# Patient Record
Sex: Female | Born: 1965
Health system: Southern US, Community
[De-identification: ages and names within clinical notes are randomized; demographics above are authoritative.]

## PROBLEM LIST (undated history)

## (undated) DIAGNOSIS — Z87898 Personal history of other specified conditions: Secondary | ICD-10-CM

## (undated) DIAGNOSIS — F419 Anxiety disorder, unspecified: Secondary | ICD-10-CM

## (undated) DIAGNOSIS — N920 Excessive and frequent menstruation with regular cycle: Secondary | ICD-10-CM

## (undated) DIAGNOSIS — E785 Hyperlipidemia, unspecified: Secondary | ICD-10-CM

## (undated) DIAGNOSIS — D509 Iron deficiency anemia, unspecified: Secondary | ICD-10-CM

## (undated) DIAGNOSIS — E113299 Type 2 diabetes mellitus with mild nonproliferative diabetic retinopathy without macular edema, unspecified eye: Secondary | ICD-10-CM

## (undated) DIAGNOSIS — R740 Nonspecific elevation of levels of transaminase and lactic acid dehydrogenase [LDH]: Secondary | ICD-10-CM

## (undated) DIAGNOSIS — E1110 Type 2 diabetes mellitus with ketoacidosis without coma: Secondary | ICD-10-CM

## (undated) DIAGNOSIS — R748 Abnormal levels of other serum enzymes: Secondary | ICD-10-CM

## (undated) DIAGNOSIS — Z22322 Carrier or suspected carrier of Methicillin resistant Staphylococcus aureus: Secondary | ICD-10-CM

## (undated) DIAGNOSIS — F32A Depression, unspecified: Secondary | ICD-10-CM

## (undated) DIAGNOSIS — F329 Major depressive disorder, single episode, unspecified: Secondary | ICD-10-CM

## (undated) DIAGNOSIS — E1010 Type 1 diabetes mellitus with ketoacidosis without coma: Secondary | ICD-10-CM

## (undated) HISTORY — DX: Depression, unspecified: F32.A

## (undated) HISTORY — DX: Excessive and frequent menstruation with regular cycle: N92.0

## (undated) HISTORY — DX: Personal history of other specified conditions: Z87.898

## (undated) HISTORY — DX: Iron deficiency anemia, unspecified: D50.9

## (undated) HISTORY — DX: Major depressive disorder, single episode, unspecified: F32.9

## (undated) HISTORY — DX: Type 2 diabetes mellitus with mild nonproliferative diabetic retinopathy without macular edema, unspecified eye: E11.3299

## (undated) HISTORY — PX: TUBAL LIGATION: SHX77

## (undated) HISTORY — DX: Anxiety disorder, unspecified: F41.9

## (undated) HISTORY — DX: Abnormal levels of other serum enzymes: R74.8

## (undated) HISTORY — DX: Nonspecific elevation of levels of transaminase and lactic acid dehydrogenase (ldh): R74.0

## (undated) HISTORY — DX: Type 1 diabetes mellitus with ketoacidosis without coma: E10.10

## (undated) HISTORY — DX: Hyperlipidemia, unspecified: E78.5

---

## 1997-08-16 ENCOUNTER — Encounter (HOSPITAL_COMMUNITY): Admission: RE | Admit: 1997-08-16 | Discharge: 1997-09-22 | Payer: Self-pay | Admitting: Obstetrics & Gynecology

## 1999-03-01 ENCOUNTER — Other Ambulatory Visit: Admission: RE | Admit: 1999-03-01 | Discharge: 1999-03-01 | Payer: Self-pay | Admitting: Family Medicine

## 1999-08-23 ENCOUNTER — Other Ambulatory Visit: Admission: RE | Admit: 1999-08-23 | Discharge: 1999-08-23 | Payer: Self-pay | Admitting: *Deleted

## 1999-10-17 ENCOUNTER — Ambulatory Visit (HOSPITAL_COMMUNITY): Admission: RE | Admit: 1999-10-17 | Discharge: 1999-10-17 | Payer: Self-pay | Admitting: *Deleted

## 1999-10-17 ENCOUNTER — Encounter: Payer: Self-pay | Admitting: *Deleted

## 1999-10-31 ENCOUNTER — Ambulatory Visit (HOSPITAL_COMMUNITY): Admission: RE | Admit: 1999-10-31 | Discharge: 1999-10-31 | Payer: Self-pay | Admitting: *Deleted

## 1999-10-31 ENCOUNTER — Encounter: Payer: Self-pay | Admitting: *Deleted

## 1999-12-19 ENCOUNTER — Encounter: Admission: RE | Admit: 1999-12-19 | Discharge: 2000-02-20 | Payer: Self-pay | Admitting: Obstetrics and Gynecology

## 1999-12-28 ENCOUNTER — Ambulatory Visit (HOSPITAL_COMMUNITY): Admission: RE | Admit: 1999-12-28 | Discharge: 1999-12-28 | Payer: Self-pay | Admitting: Obstetrics and Gynecology

## 1999-12-28 ENCOUNTER — Encounter: Payer: Self-pay | Admitting: Obstetrics and Gynecology

## 2000-01-29 ENCOUNTER — Ambulatory Visit (HOSPITAL_COMMUNITY): Admission: RE | Admit: 2000-01-29 | Discharge: 2000-01-29 | Payer: Self-pay | Admitting: Obstetrics and Gynecology

## 2000-01-29 ENCOUNTER — Encounter: Payer: Self-pay | Admitting: Obstetrics and Gynecology

## 2000-02-09 ENCOUNTER — Encounter: Admission: RE | Admit: 2000-02-09 | Discharge: 2000-03-07 | Payer: Self-pay | Admitting: Obstetrics and Gynecology

## 2000-02-19 ENCOUNTER — Encounter: Payer: Self-pay | Admitting: Emergency Medicine

## 2000-02-19 ENCOUNTER — Observation Stay (HOSPITAL_COMMUNITY): Admission: AD | Admit: 2000-02-19 | Discharge: 2000-02-20 | Payer: Self-pay | Admitting: Obstetrics and Gynecology

## 2000-02-21 ENCOUNTER — Encounter: Payer: Self-pay | Admitting: Obstetrics and Gynecology

## 2000-03-06 ENCOUNTER — Encounter (INDEPENDENT_AMBULATORY_CARE_PROVIDER_SITE_OTHER): Payer: Self-pay

## 2000-03-06 ENCOUNTER — Inpatient Hospital Stay (HOSPITAL_COMMUNITY): Admission: AD | Admit: 2000-03-06 | Discharge: 2000-03-09 | Payer: Self-pay | Admitting: Obstetrics and Gynecology

## 2000-03-10 ENCOUNTER — Encounter: Admission: RE | Admit: 2000-03-10 | Discharge: 2000-06-08 | Payer: Self-pay | Admitting: Obstetrics and Gynecology

## 2000-05-24 ENCOUNTER — Encounter: Payer: Self-pay | Admitting: Neurological Surgery

## 2000-05-24 ENCOUNTER — Encounter: Admission: RE | Admit: 2000-05-24 | Discharge: 2000-05-24 | Payer: Self-pay | Admitting: Neurological Surgery

## 2000-06-03 ENCOUNTER — Inpatient Hospital Stay (HOSPITAL_COMMUNITY): Admission: RE | Admit: 2000-06-03 | Discharge: 2000-06-05 | Payer: Self-pay | Admitting: Neurological Surgery

## 2000-06-03 ENCOUNTER — Encounter: Payer: Self-pay | Admitting: Neurological Surgery

## 2000-06-04 ENCOUNTER — Encounter: Payer: Self-pay | Admitting: Neurological Surgery

## 2000-07-03 ENCOUNTER — Encounter: Payer: Self-pay | Admitting: Neurological Surgery

## 2000-07-03 ENCOUNTER — Encounter: Admission: RE | Admit: 2000-07-03 | Discharge: 2000-07-03 | Payer: Self-pay | Admitting: Neurological Surgery

## 2000-07-22 ENCOUNTER — Encounter: Admission: RE | Admit: 2000-07-22 | Discharge: 2000-07-22 | Payer: Self-pay | Admitting: Neurological Surgery

## 2000-07-22 ENCOUNTER — Encounter: Payer: Self-pay | Admitting: Neurological Surgery

## 2000-08-20 ENCOUNTER — Encounter: Admission: RE | Admit: 2000-08-20 | Discharge: 2000-08-20 | Payer: Self-pay | Admitting: Neurological Surgery

## 2000-08-20 ENCOUNTER — Encounter: Payer: Self-pay | Admitting: Neurological Surgery

## 2000-08-26 ENCOUNTER — Encounter: Admission: RE | Admit: 2000-08-26 | Discharge: 2000-11-24 | Payer: Self-pay | Admitting: Neurological Surgery

## 2000-09-17 ENCOUNTER — Encounter: Admission: RE | Admit: 2000-09-17 | Discharge: 2000-09-17 | Payer: Self-pay | Admitting: Neurological Surgery

## 2000-09-17 ENCOUNTER — Encounter: Payer: Self-pay | Admitting: Neurological Surgery

## 2001-01-13 ENCOUNTER — Other Ambulatory Visit: Admission: RE | Admit: 2001-01-13 | Discharge: 2001-01-13 | Payer: Self-pay | Admitting: Obstetrics and Gynecology

## 2002-01-19 ENCOUNTER — Encounter: Payer: Self-pay | Admitting: Emergency Medicine

## 2002-01-19 ENCOUNTER — Encounter: Payer: Self-pay | Admitting: Internal Medicine

## 2002-01-19 ENCOUNTER — Inpatient Hospital Stay (HOSPITAL_COMMUNITY): Admission: EM | Admit: 2002-01-19 | Discharge: 2002-01-21 | Payer: Self-pay | Admitting: Emergency Medicine

## 2002-01-20 ENCOUNTER — Encounter (INDEPENDENT_AMBULATORY_CARE_PROVIDER_SITE_OTHER): Payer: Self-pay | Admitting: Cardiology

## 2002-01-21 ENCOUNTER — Other Ambulatory Visit: Admission: RE | Admit: 2002-01-21 | Discharge: 2002-01-21 | Payer: Self-pay | Admitting: Obstetrics and Gynecology

## 2002-02-06 ENCOUNTER — Ambulatory Visit (HOSPITAL_COMMUNITY): Admission: RE | Admit: 2002-02-06 | Discharge: 2002-02-06 | Payer: Self-pay | Admitting: Cardiology

## 2003-02-19 ENCOUNTER — Other Ambulatory Visit: Admission: RE | Admit: 2003-02-19 | Discharge: 2003-02-19 | Payer: Self-pay | Admitting: Obstetrics and Gynecology

## 2003-03-23 ENCOUNTER — Encounter: Admission: RE | Admit: 2003-03-23 | Discharge: 2003-06-21 | Payer: Self-pay | Admitting: Obstetrics and Gynecology

## 2003-04-07 ENCOUNTER — Ambulatory Visit (HOSPITAL_COMMUNITY): Admission: RE | Admit: 2003-04-07 | Discharge: 2003-04-07 | Payer: Self-pay | Admitting: Obstetrics and Gynecology

## 2003-04-07 ENCOUNTER — Encounter: Payer: Self-pay | Admitting: Obstetrics and Gynecology

## 2003-06-14 ENCOUNTER — Ambulatory Visit (HOSPITAL_COMMUNITY): Admission: RE | Admit: 2003-06-14 | Discharge: 2003-06-14 | Payer: Self-pay | Admitting: Obstetrics and Gynecology

## 2003-07-27 ENCOUNTER — Ambulatory Visit (HOSPITAL_COMMUNITY): Admission: RE | Admit: 2003-07-27 | Discharge: 2003-07-27 | Payer: Self-pay | Admitting: Obstetrics and Gynecology

## 2003-08-19 ENCOUNTER — Inpatient Hospital Stay (HOSPITAL_COMMUNITY): Admission: RE | Admit: 2003-08-19 | Discharge: 2003-08-22 | Payer: Self-pay | Admitting: Obstetrics and Gynecology

## 2003-08-19 ENCOUNTER — Encounter (INDEPENDENT_AMBULATORY_CARE_PROVIDER_SITE_OTHER): Payer: Self-pay | Admitting: *Deleted

## 2005-11-08 ENCOUNTER — Other Ambulatory Visit: Admission: RE | Admit: 2005-11-08 | Discharge: 2005-11-08 | Payer: Self-pay | Admitting: Emergency Medicine

## 2006-06-03 ENCOUNTER — Ambulatory Visit (HOSPITAL_COMMUNITY): Admission: RE | Admit: 2006-06-03 | Discharge: 2006-06-03 | Payer: Self-pay | Admitting: Emergency Medicine

## 2006-09-16 ENCOUNTER — Ambulatory Visit (HOSPITAL_COMMUNITY): Admission: RE | Admit: 2006-09-16 | Discharge: 2006-09-16 | Payer: Self-pay | Admitting: Internal Medicine

## 2007-09-24 ENCOUNTER — Other Ambulatory Visit: Admission: RE | Admit: 2007-09-24 | Discharge: 2007-09-24 | Payer: Self-pay | Admitting: Internal Medicine

## 2007-11-25 ENCOUNTER — Ambulatory Visit (HOSPITAL_COMMUNITY): Admission: RE | Admit: 2007-11-25 | Discharge: 2007-11-25 | Payer: Self-pay | Admitting: Internal Medicine

## 2007-12-01 ENCOUNTER — Ambulatory Visit (HOSPITAL_COMMUNITY): Admission: RE | Admit: 2007-12-01 | Discharge: 2007-12-01 | Payer: Self-pay | Admitting: Internal Medicine

## 2007-12-01 LAB — CONVERTED CEMR LAB: Pap Smear: NORMAL

## 2008-07-29 ENCOUNTER — Ambulatory Visit: Payer: Self-pay | Admitting: Internal Medicine

## 2008-07-29 DIAGNOSIS — I10 Essential (primary) hypertension: Secondary | ICD-10-CM | POA: Insufficient documentation

## 2008-07-29 DIAGNOSIS — F329 Major depressive disorder, single episode, unspecified: Secondary | ICD-10-CM

## 2008-07-29 DIAGNOSIS — E782 Mixed hyperlipidemia: Secondary | ICD-10-CM | POA: Insufficient documentation

## 2008-07-29 DIAGNOSIS — E785 Hyperlipidemia, unspecified: Secondary | ICD-10-CM

## 2008-07-29 LAB — CONVERTED CEMR LAB
Albumin: 4 g/dL (ref 3.5–5.2)
Alkaline Phosphatase: 88 units/L (ref 39–117)
BUN: 12 mg/dL (ref 6–23)
Cholesterol: 140 mg/dL (ref 0–200)
Creatinine, Ser: 0.8 mg/dL (ref 0.4–1.2)
Creatinine,U: 89.8 mg/dL
Eosinophils Absolute: 0.2 10*3/uL (ref 0.0–0.7)
Eosinophils Relative: 3.2 % (ref 0.0–5.0)
GFR calc Af Amer: 101 mL/min
GFR calc non Af Amer: 84 mL/min
HCT: 36.9 % (ref 36.0–46.0)
HDL: 41.5 mg/dL (ref >39.0)
Iron: 25 ug/dL — ABNORMAL LOW (ref 42–145)
MCHC: 32 g/dL (ref 30.0–36.0)
MCV: 76.4 fL — ABNORMAL LOW (ref 78.0–100.0)
Microalb Creat Ratio: 6.7 mg/g (ref 0.0–30.0)
Monocytes Absolute: 0.6 10*3/uL (ref 0.1–1.0)
Platelets: 281 10*3/uL (ref 150–400)
Potassium: 3.9 meq/L (ref 3.5–5.1)
Triglycerides: 85 mg/dL (ref 0–149)
WBC: 6.2 10*3/uL (ref 4.5–10.5)

## 2008-08-01 DIAGNOSIS — E1165 Type 2 diabetes mellitus with hyperglycemia: Secondary | ICD-10-CM

## 2008-09-09 ENCOUNTER — Ambulatory Visit: Payer: Self-pay | Admitting: Internal Medicine

## 2008-09-21 ENCOUNTER — Encounter: Payer: Self-pay | Admitting: Internal Medicine

## 2008-10-07 ENCOUNTER — Ambulatory Visit: Payer: Self-pay | Admitting: Internal Medicine

## 2008-10-07 DIAGNOSIS — D509 Iron deficiency anemia, unspecified: Secondary | ICD-10-CM

## 2008-12-31 ENCOUNTER — Ambulatory Visit: Payer: Self-pay | Admitting: Internal Medicine

## 2008-12-31 LAB — CONVERTED CEMR LAB
BUN: 18 mg/dL (ref 6–23)
Basophils Absolute: 0.1 10*3/uL (ref 0.0–0.1)
CO2: 29 meq/L (ref 19–32)
Chloride: 104 meq/L (ref 96–112)
Eosinophils Absolute: 0.1 10*3/uL (ref 0.0–0.7)
Glucose, Bld: 86 mg/dL (ref 70–99)
HCT: 37.6 % (ref 36.0–46.0)
Hemoglobin: 12.8 g/dL (ref 12.0–15.0)
Lymphs Abs: 1.6 10*3/uL (ref 0.7–4.0)
MCHC: 34.1 g/dL (ref 30.0–36.0)
Neutro Abs: 3.4 10*3/uL (ref 1.4–7.7)
Potassium: 3.8 meq/L (ref 3.5–5.1)
RDW: 12.8 % (ref 11.5–14.6)

## 2009-01-07 ENCOUNTER — Ambulatory Visit: Payer: Self-pay | Admitting: Internal Medicine

## 2009-01-07 ENCOUNTER — Ambulatory Visit (HOSPITAL_BASED_OUTPATIENT_CLINIC_OR_DEPARTMENT_OTHER): Admission: RE | Admit: 2009-01-07 | Discharge: 2009-01-07 | Payer: Self-pay | Admitting: Internal Medicine

## 2009-03-10 LAB — CONVERTED CEMR LAB: Vit D, 1,25-Dihydroxy: 46 (ref 30–89)

## 2009-04-07 ENCOUNTER — Ambulatory Visit: Payer: Self-pay | Admitting: Internal Medicine

## 2009-04-07 LAB — CONVERTED CEMR LAB
Albumin: 3.9 g/dL (ref 3.5–5.2)
Alkaline Phosphatase: 99 units/L (ref 39–117)
CO2: 29 meq/L (ref 19–32)
Chloride: 101 meq/L (ref 96–112)
Creatinine, Ser: 0.83 mg/dL (ref 0.40–1.20)
HDL: 53 mg/dL (ref >39)
Hgb A1c MFr Bld: 8.8 % — ABNORMAL HIGH (ref 4.6–6.1)
LDL Cholesterol: 72 mg/dL (ref 0–99)
Potassium: 4.4 meq/L (ref 3.5–5.3)
Total Bilirubin: 0.9 mg/dL (ref 0.3–1.2)
VLDL: 13 mg/dL (ref 0–40)

## 2009-04-14 ENCOUNTER — Ambulatory Visit: Payer: Self-pay | Admitting: Internal Medicine

## 2009-04-15 ENCOUNTER — Encounter: Payer: Self-pay | Admitting: Internal Medicine

## 2009-04-25 ENCOUNTER — Encounter: Payer: Self-pay | Admitting: Internal Medicine

## 2009-04-28 ENCOUNTER — Telehealth (INDEPENDENT_AMBULATORY_CARE_PROVIDER_SITE_OTHER): Payer: Self-pay | Admitting: *Deleted

## 2009-05-19 ENCOUNTER — Encounter: Payer: Self-pay | Admitting: Internal Medicine

## 2009-05-19 ENCOUNTER — Telehealth: Payer: Self-pay | Admitting: Internal Medicine

## 2009-07-25 ENCOUNTER — Ambulatory Visit: Payer: Self-pay | Admitting: Internal Medicine

## 2009-07-25 DIAGNOSIS — H9209 Otalgia, unspecified ear: Secondary | ICD-10-CM | POA: Insufficient documentation

## 2009-09-08 ENCOUNTER — Ambulatory Visit: Payer: Self-pay | Admitting: Internal Medicine

## 2009-09-08 LAB — CONVERTED CEMR LAB
BUN: 18 mg/dL (ref 6–23)
Chloride: 103 meq/L (ref 96–112)
Hgb A1c MFr Bld: 9.2 % — ABNORMAL HIGH (ref 4.6–6.1)
Potassium: 4.5 meq/L (ref 3.5–5.3)
Sodium: 138 meq/L (ref 135–145)

## 2009-09-16 ENCOUNTER — Telehealth: Payer: Self-pay | Admitting: Internal Medicine

## 2009-10-06 ENCOUNTER — Ambulatory Visit: Payer: Self-pay | Admitting: Internal Medicine

## 2009-10-06 DIAGNOSIS — N920 Excessive and frequent menstruation with regular cycle: Secondary | ICD-10-CM

## 2009-10-12 ENCOUNTER — Telehealth (INDEPENDENT_AMBULATORY_CARE_PROVIDER_SITE_OTHER): Payer: Self-pay | Admitting: *Deleted

## 2009-10-17 ENCOUNTER — Telehealth: Payer: Self-pay | Admitting: Internal Medicine

## 2009-10-21 ENCOUNTER — Telehealth: Payer: Self-pay | Admitting: Internal Medicine

## 2009-11-14 ENCOUNTER — Telehealth: Payer: Self-pay | Admitting: Internal Medicine

## 2009-11-16 ENCOUNTER — Telehealth: Payer: Self-pay | Admitting: Internal Medicine

## 2009-11-18 ENCOUNTER — Ambulatory Visit: Payer: Self-pay | Admitting: Internal Medicine

## 2009-11-18 DIAGNOSIS — J329 Chronic sinusitis, unspecified: Secondary | ICD-10-CM | POA: Insufficient documentation

## 2009-11-29 ENCOUNTER — Encounter: Payer: Self-pay | Admitting: Internal Medicine

## 2009-12-19 ENCOUNTER — Telehealth: Payer: Self-pay | Admitting: Internal Medicine

## 2010-03-01 ENCOUNTER — Telehealth: Payer: Self-pay | Admitting: Family

## 2010-03-01 ENCOUNTER — Ambulatory Visit: Payer: Self-pay | Admitting: Family

## 2010-03-01 DIAGNOSIS — K047 Periapical abscess without sinus: Secondary | ICD-10-CM

## 2010-03-03 ENCOUNTER — Ambulatory Visit: Payer: Self-pay | Admitting: Family

## 2010-03-10 ENCOUNTER — Ambulatory Visit: Payer: Self-pay | Admitting: Internal Medicine

## 2010-03-10 LAB — CONVERTED CEMR LAB
ALT: 65 units/L — ABNORMAL HIGH (ref 0–35)
Alkaline Phosphatase: 123 units/L — ABNORMAL HIGH (ref 39–117)
BUN: 9 mg/dL (ref 6–23)
Bilirubin, Direct: 0.1 mg/dL (ref 0.0–0.3)
Calcium: 9.1 mg/dL (ref 8.4–10.5)
Cholesterol, target level: 200 mg/dL
Cholesterol: 138 mg/dL (ref 0–200)
Creatinine, Ser: 0.74 mg/dL (ref 0.40–1.20)
Creatinine, Urine: 45.7 mg/dL
Glucose, Bld: 105 mg/dL — ABNORMAL HIGH (ref 70–99)
HDL goal, serum: 40 mg/dL
Hgb A1c MFr Bld: 10.9 % — ABNORMAL HIGH (ref <5.7)
Indirect Bilirubin: 0.6 mg/dL (ref 0.0–0.9)
LDL Goal: 100 mg/dL
Microalb, Ur: 0.5 mg/dL (ref 0.00–1.89)
Total Protein: 6.4 g/dL (ref 6.0–8.3)
Triglycerides: 171 mg/dL — ABNORMAL HIGH (ref <150)

## 2010-03-10 LAB — HM DIABETES FOOT EXAM

## 2010-03-16 ENCOUNTER — Telehealth: Payer: Self-pay | Admitting: Internal Medicine

## 2010-03-16 ENCOUNTER — Encounter: Payer: Self-pay | Admitting: Internal Medicine

## 2010-04-19 ENCOUNTER — Encounter: Payer: Self-pay | Admitting: Internal Medicine

## 2010-06-15 ENCOUNTER — Encounter: Payer: Self-pay | Admitting: Internal Medicine

## 2010-08-17 NOTE — Progress Notes (Signed)
Summary: Diabetes Education  Phone Note Outgoing Call   Call placed by: Glendell Docker CMA,  October 21, 2009 8:48 AM Call placed to: Patient Summary of Call: call placed to patient to see if she would be interested in attending Novolog Diabetes Educatioin here in the office on 11/24/2009, she states she will be out of town on a beach trip with her daughter Initial call taken by: Glendell Docker CMA,  October 21, 2009 8:49 AM

## 2010-08-17 NOTE — Progress Notes (Signed)
Summary: lab results  Phone Note Outgoing Call   Summary of Call: call pt - one of pt's liver enzymes slightly elevated.  I suggest we change cholesterol medication.  see rx  LFTs should be repeated in 2 months after starting new cholesterol medication.  790.4 Initial call taken by: D. Thomos Lemons DO,  March 16, 2010 6:32 PM  Follow-up for Phone Call        Pt notified of Dr Olegario Messier instructions. Order faxed to the lab for 05/08/10 as pt has follow up on 05/11/10.  Nicki Guadalajara Fergerson CMA Duncan Dull)  March 17, 2010 9:19 AM     New/Updated Medications: LOVASTATIN 20 MG TABS (LOVASTATIN) one by mouth once daily Prescriptions: LOVASTATIN 20 MG TABS (LOVASTATIN) one by mouth once daily  #30 x 2   Entered and Authorized by:   D. Thomos Lemons DO   Signed by:   D. Thomos Lemons DO on 03/16/2010   Method used:   Electronically to        [facility]  [phone]* (retail)       [address]       [place], [place]  [address]       Ph: [phone] or [phone]       Fax: [phone]   RxID:   [id number]

## 2010-08-17 NOTE — Progress Notes (Signed)
Summary: refill request out of lantus   Phone Note Refill Request Call back at Home Phone [phone] Message from:  Patient on October 12, 2009 11:01 AM  Refills Requested: Medication #1:  LANTUS 100 UNIT/ML SOLN inject 15- 35  units subcutaneously once daily  PM   Dosage confirmed as above?Dosage Confirmed   Brand Name Necessary? No   Supply Requested: 1 month [facility]   Nicolette Bang says she has no refills and she is completely out.     Method Requested: Electronic Next Appointment Scheduled: 11-10-09  10 ;45 Dr Artist Pais  Initial call taken by: Roselle Locus,  October 12, 2009 11:01 AM  Follow-up for Phone Call        Notified pt. that Lantus was refilled.  Nicki Guadalajara Fergerson CMA  October 12, 2009 2:08 PM     Prescriptions: LANTUS 100 UNIT/ML SOLN (INSULIN GLARGINE) inject 15- 35  units subcutaneously once daily  PM  #1 x 0   Entered by:   Mervin Kung CMA   Authorized by:   D. Thomos Lemons DO   Signed by:   Mervin Kung CMA on 10/12/2009   Method used:   Electronically to        [facility]  [phone]* (retail)       [address]       [place], [place]  [address]       Ph: [phone] or [phone]       Fax: [phone]   RxID:   [id number]
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
## 2010-08-17 NOTE — Letter (Signed)
Summary: [facility]  [facility]   Imported By: Lanelle Bal 06/27/2010 13:09:30  _____________________________________________________________________  External Attachment:    Type:   Image     Comment:   External Document

## 2010-08-17 NOTE — Letter (Signed)
    [facility] [address] [place], [place]  [address]  [place] Phone: [phone]      March 16, 2010   TULLY BURGO [address] [place], [place] [address]  RE:  LAB RESULTS  Dear  Ms. HEACOX,  The following is an interpretation of your most recent lab tests.  Please take note of any instructions provided or changes to medications that have resulted from your lab work.  ELECTROLYTES:  Good - no changes needed  KIDNEY FUNCTION TESTS:  Good - no changes needed  LIVER FUNCTION TESTS:  Abnormal - schedule a follow-up appointment  LIPID PANEL:  Fair - review at your next visit Triglyceride: 171   Cholesterol: 138   LDL: 60   HDL: 44   Chol/HDL%:  3.1 Ratio  THYROID STUDIES:  Thyroid studies normal TSH: 1.215     DIABETIC STUDIES:  Poor - schedule a follow-up appointment soon Blood Glucose: 105   HgbA1C: 10.9   Microalbumin/Creatinine Ratio: 10.9          Sincerely Yours,    Dr. Thomos Lemons
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
## 2010-08-17 NOTE — Progress Notes (Signed)
Summary: Humulin Refill  Phone Note Refill Request Message from:  Fax from Pharmacy on September 16, 2009 8:31 AM  Refills Requested: Medication #1:  HUMULIN R 100 UNIT/ML SOLN 5 units three times a day 15-30 mins before meals   Dosage confirmed as above?Dosage Confirmed   Brand Name Necessary? No   Supply Requested: 1 month   Last Refilled: 08/21/2009 [facility] [address] [place] FAX [phone]   Method Requested: Electronic Next Appointment Scheduled: 09-30-2009 10 AM DR Artist Pais  Initial call taken by: Roselle Locus,  September 16, 2009 8:32 AM    Prescriptions: HUMULIN R 100 UNIT/ML SOLN (INSULIN REGULAR HUMAN) 5 units three times a day 15-30 mins before meals  #1 x 0   Entered by:   Glendell Docker CMA   Authorized by:   D. Thomos Lemons DO   Signed by:   Glendell Docker CMA on 09/16/2009   Method used:   Electronically to        [facility]  [phone]* (retail)       [address]       [place], [place]  [address]       Ph: [phone] or [phone]       Fax: [phone]   RxID:   [id number]

## 2010-08-17 NOTE — Progress Notes (Signed)
Summary: [facility]ctz Refill  Phone Note Refill Request Message from:  Fax from Pharmacy on Nov 16, 2009 4:27 PM  Refills Requested: Medication #1:  HYDROCHLOROTHIAZIDE 25 MG TABS Take 1/2  tablet by mouth once a day   Dosage confirmed as above?Dosage Confirmed   Brand Name Necessary? No   Supply Requested: 3 months  Method Requested: Electronic Next Appointment Scheduled: 11/18/2009 @ 3:30p Initial call taken by: Glendell Docker CMA,  Nov 16, 2009 4:27 PM  Follow-up for Phone Call        Rx completed in Dr. Tiajuana Amass Follow-up by: Glendell Docker CMA,  Nov 16, 2009 4:28 PM    Prescriptions: HYDROCHLOROTHIAZIDE 25 MG TABS (HYDROCHLOROTHIAZIDE) Take 1/2  tablet by mouth once a day  #90 x 1   Entered by:   Glendell Docker CMA   Authorized by:   D. Thomos Lemons DO   Signed by:   Glendell Docker CMA on 11/16/2009   Method used:   Electronically to        [facility]  [phone]* (retail)       [address]       [place], [place]  [address]       Ph: [phone] or [phone]       Fax: [phone]   RxID:   [id number]

## 2010-08-17 NOTE — Consult Note (Signed)
Summary: [facility]  [facility]   Imported By: Lanelle Bal 04/27/2010 12:12:09  _____________________________________________________________________  External Attachment:    Type:   Image     Comment:   External Document
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
## 2010-08-17 NOTE — Progress Notes (Signed)
Summary: Lantus refill  Phone Note Refill Request Message from:  Patient on Nov 14, 2009 10:34 AM  Refills Requested: Medication #1:  LANTUS 100 UNIT/ML SOLN inject 15- 35  units subcutaneously once daily  PM Patient notified.  Initial call taken by: Lucious Groves,  Nov 14, 2009 10:34 AM    Prescriptions: LANTUS 100 UNIT/ML SOLN (INSULIN GLARGINE) inject 15- 35  units subcutaneously once daily  PM  #1 x 0   Entered by:   Lucious Groves   Authorized by:   D. Thomos Lemons DO   Signed by:   Lucious Groves on 11/14/2009   Method used:   Electronically to        [facility]  [phone]* (retail)       [address]       [place], [place]  [address]       Ph: [phone] or [phone]       Fax: [phone]   RxID:   [id number]

## 2010-08-17 NOTE — Medication Information (Signed)
Summary: Diabetes Supplies/[employer]  Diabetes Supplies/[employer]   Imported By: Lanelle Bal 12/05/2009 11:19:19  _____________________________________________________________________  External Attachment:    Type:   Image     Comment:   External Document

## 2010-08-17 NOTE — Progress Notes (Signed)
Summary: Swollen face, low blood sugar  Phone Note Call from Patient Call back at Home Phone [phone]   Caller: Spouse Summary of Call: Pt's spouse is very worried about pt, her face is very swollen from a broken filling which is very uncomfortable but her blood sugar is also low, does not want to go to emergency room, made a 2:30 appt today but spouse would like to know what she can do until appt Initial call taken by: Lannette Donath,  March 01, 2010 10:59 AM  Follow-up for Phone Call        Spoke to patient and husband.  CBG this AM is 76.  Came up to 112 after eating banana.  Instructed pt to keep apt this afternoon and avoid any meal coverage today unless she is eating a full regualar meal.   Follow-up by: Lemont Fillers FNP,  March 01, 2010 11:14 AM

## 2010-08-17 NOTE — Progress Notes (Signed)
Summary: Metformin Refill  Phone Note Refill Request Message from:  Fax from Pharmacy on October 17, 2009 3:26 PM  Refills Requested: Medication #1:  METFORMIN HCL 1000 MG TABS one by mouth bid   Dosage confirmed as above?Dosage Confirmed   Brand Name Necessary? No   Supply Requested: 1 month   Last Refilled: 09/15/2009 [facility] pharmacy [address] [place] [address] fax [phone]   Method Requested: Electronic Next Appointment Scheduled: 11-10-2009 10:45 Dr Artist Pais  Initial call taken by: Roselle Locus,  October 17, 2009 3:27 PM  Follow-up for Phone Call        Rx completed in Dr. Tiajuana Amass Follow-up by: Glendell Docker CMA,  October 18, 2009 8:13 AM    Prescriptions: METFORMIN HCL 1000 MG TABS (METFORMIN HCL) one by mouth bid  #60 x 5   Entered by:   Glendell Docker CMA   Authorized by:   D. Thomos Lemons DO   Signed by:   Glendell Docker CMA on 10/18/2009   Method used:   Electronically to        [facility]  [phone]* (retail)       [address]       [place], [place]  [address]       Ph: [phone] or [phone]       Fax: [phone]   RxID:   [id number]

## 2010-08-17 NOTE — Progress Notes (Signed)
Summary: Simvastatin Refill  Phone Note Refill Request Message from:  Fax from Pharmacy on December 19, 2009 12:21 PM  Refills Requested: Medication #1:  SIMVASTATIN 20 MG TABS one by mouth qpm   Dosage confirmed as above?Dosage Confirmed   Brand Name Necessary? No   Supply Requested: 1 month   Last Refilled: 11/16/2009  Method Requested: Electronic Next Appointment Scheduled: None Initial call taken by: Glendell Docker CMA,  December 19, 2009 12:21 PM    Prescriptions: SIMVASTATIN 20 MG TABS (SIMVASTATIN) one by mouth qpm  #30 x 3   Entered by:   Glendell Docker CMA   Authorized by:   D. Thomos Lemons DO   Signed by:   Glendell Docker CMA on 12/19/2009   Method used:   Electronically to        [facility]  [phone]* (retail)       [address]       [place], [place]  [address]       Ph: [phone] or [phone]       Fax: [phone]   RxID:   [id number]
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
## 2010-08-22 ENCOUNTER — Encounter: Payer: Self-pay | Admitting: Internal Medicine

## 2010-09-06 NOTE — Medication Information (Signed)
Summary: Order for Diabetic Supplies  Order for Diabetic Supplies   Imported By: Maryln Gottron 08/31/2010 14:54:09  _____________________________________________________________________  External Attachment:    Type:   Image     Comment:   External Document

## 2010-11-15 ENCOUNTER — Ambulatory Visit (INDEPENDENT_AMBULATORY_CARE_PROVIDER_SITE_OTHER): Payer: Medicare FFS | Admitting: Family

## 2010-11-15 ENCOUNTER — Encounter: Payer: Self-pay | Admitting: Family

## 2010-11-15 VITALS — BP 110/86 | HR 72 | Temp 98.2°F | Resp 16 | Ht 67.52 in | Wt 208.1 lb

## 2010-11-15 DIAGNOSIS — T7840XA Allergy, unspecified, initial encounter: Secondary | ICD-10-CM

## 2010-11-15 DIAGNOSIS — T6391XA Toxic effect of contact with unspecified venomous animal, accidental (unintentional), initial encounter: Secondary | ICD-10-CM

## 2010-11-15 DIAGNOSIS — T63441A Toxic effect of venom of bees, accidental (unintentional), initial encounter: Secondary | ICD-10-CM | POA: Insufficient documentation

## 2010-11-15 MED ORDER — CEPHALEXIN 500 MG PO CAPS: 500.0000 mg | ORAL_CAPSULE | Freq: Four times a day (QID) | ORAL | Status: DC

## 2010-11-15 MED ORDER — METHYLPREDNISOLONE SODIUM SUCC 125 MG IJ SOLR
125.0000 mg | Freq: Once | INTRAMUSCULAR | Status: AC
  Administered 2010-11-15: 125 mg via INTRAMUSCULAR

## 2010-11-15 MED ORDER — PREDNISONE 10 MG PO TABS: ORAL_TABLET | ORAL | Status: DC

## 2010-11-15 MED ORDER — EPINEPHRINE 0.3 MG/0.3ML IJ DEVI: INTRAMUSCULAR | Status: DC

## 2010-11-15 NOTE — Assessment & Plan Note (Signed)
History and presentation most consistent with bee sting and local swelling.  Pt was given IM solumedrol in the office to be followed by a quick steroid taper. Will also treat empirically with PO keflex for cellulitis. She was also given an epi pen to have on hand for future bee stings. Recommended PRN benadryl and close monitoring of sugars while she is completing steroids.  Pt instructed to contact us if her sugar is >300.
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
## 2010-11-15 NOTE — Patient Instructions (Signed)
Prednisone 4 tabs once tomorrow, then 3 tabs on 5/3, then 2 tabs on 5/4, then 1 tab on 5/5 then stop.   Go to ER if you develop increased swelling, redness, or pain of right hand- or if you develop hives, tongue/lip swelling or shortness of breath.  Follow up with Dr. Artist Pais in 1 week.

## 2010-11-17 ENCOUNTER — Ambulatory Visit (INDEPENDENT_AMBULATORY_CARE_PROVIDER_SITE_OTHER): Payer: Medicare FFS | Admitting: Family

## 2010-11-17 ENCOUNTER — Encounter: Payer: Self-pay | Admitting: Family

## 2010-11-17 VITALS — BP 116/76 | HR 72 | Temp 97.8°F | Resp 16 | Ht 67.5 in | Wt 210.1 lb

## 2010-11-17 DIAGNOSIS — T63441A Toxic effect of venom of bees, accidental (unintentional), initial encounter: Secondary | ICD-10-CM

## 2010-11-17 DIAGNOSIS — T6391XA Toxic effect of contact with unspecified venomous animal, accidental (unintentional), initial encounter: Secondary | ICD-10-CM

## 2010-11-17 DIAGNOSIS — L089 Local infection of the skin and subcutaneous tissue, unspecified: Secondary | ICD-10-CM

## 2010-11-17 LAB — CBC WITH DIFFERENTIAL/PLATELET
Basophils Absolute: 0.1 10*3/uL (ref 0.0–0.1)
Eosinophils Relative: 1 % (ref 0–5)
HCT: 35.5 % — ABNORMAL LOW (ref 36.0–46.0)
Lymphocytes Relative: 16 % (ref 12–46)
Lymphs Abs: 1.5 10*3/uL (ref 0.7–4.0)
MCV: 81.4 fL (ref 78.0–100.0)
Monocytes Absolute: 0.6 10*3/uL (ref 0.1–1.0)
Neutro Abs: 7.4 10*3/uL (ref 1.7–7.7)
Platelets: 356 10*3/uL (ref 150–400)
RBC: 4.36 MIL/uL (ref 3.87–5.11)
RDW: 13.5 % (ref 11.5–15.5)
WBC: 9.7 10*3/uL (ref 4.0–10.5)

## 2010-11-17 MED ORDER — CLINDAMYCIN HCL 300 MG PO CAPS: 300.0000 mg | ORAL_CAPSULE | Freq: Four times a day (QID) | ORAL | Status: AC

## 2010-11-17 NOTE — Patient Instructions (Signed)
Stop Keflex, start clindamycin.   Go to ER if you develop increased pain, swelling, or bruising of the right hand and arm.   Follow up with Dr. Artist Pais on Monday 5/7.

## 2010-11-17 NOTE — Assessment & Plan Note (Addendum)
Swelling is improved, but hand is somewhat black/blue now. Pt has apparent venom reaction +/- cellulitis.  Will change Keflex to Clindamycin.  Continue prednisone and PRN Benadryl. Patient was examined by Dr Artist Pais and myself today. Case was reviewed with Dr. Margarita Grizzle ER physician on duty who agreed with treatment plans.  Plan close pt follow up.  Pt was instructed to go to ED as outlined in pt instructions.
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
## 2010-11-20 ENCOUNTER — Ambulatory Visit (INDEPENDENT_AMBULATORY_CARE_PROVIDER_SITE_OTHER): Payer: Medicare FFS | Admitting: Internal Medicine

## 2010-11-20 ENCOUNTER — Encounter: Payer: Self-pay | Admitting: Internal Medicine

## 2010-11-20 VITALS — BP 108/70 | HR 87 | Temp 97.7°F | Resp 18 | Wt 213.0 lb

## 2010-11-20 DIAGNOSIS — E119 Type 2 diabetes mellitus without complications: Secondary | ICD-10-CM

## 2010-11-20 DIAGNOSIS — Z0289 Encounter for other administrative examinations: Secondary | ICD-10-CM

## 2010-11-20 NOTE — Patient Instructions (Signed)
Stop taking prednisone.

## 2010-12-01 NOTE — H&P (Signed)
[facility]  Patient:    Kristie [name], Kristie [name] Visit Number: [id number] MRN: [id number]          Service Type: MED Location: [phone] Attending Physician:  Selina Cooley Dictated by:   Jackie Plum, M.D. Admit Date:  01/19/2002 Discharge Date: 01/21/2002   CC:         [place]. Cliffton Asters, M.D. of [facility]  [place] [facility]. Thomasena Edis, M.D. of [facility]   History and Physical  PROBLEM LIST: 1. Syncopal episode.    Probably related to hypotension with cerebral hypoperfusion probably    resulting from her menorrhagia x2 weeks - though her hemoglobin on    admission was 12.1 g/dl. 2. Diabetes mellitus. 3. Obesity. 4. History of depression/anxiety. 5. History of os odontoideum with instability.  CHIEF COMPLAINT:  "I passed out five times this morning."  HISTORY OF PRESENT ILLNESS:  The patient is a 45 year old Caucasian lady with history of diabetes mellitus who presented to the ED this morning after five syncopal episodes.  The patient alleged that she was in her usual state of health until 5 a.m. this morning.  While she was standing in the shower bathing, passed out on her way out of the shower.  Her husband said that he heard a thud of the wife falling down and called out and the wife responded a few minutes later that she was all right.  The husband also heard a second thud; this time he rushed there and the wife was lying on the floor unconscious.  This lasted a few minutes - actually less than four to five minutes.  The patient came around.  She was fully conscious and alert and responsive at that time.  According to the patients husband, he got the patient up and she passed out again, unconscious.  He put her down on the floor and she came around fully conscious again, and this episode also lasted less than four to five minutes.  There was a second episode of similar characteristics on getting the patient up from the lying  position.  After this episode there was a last and fifth episode which occurred while the patient was sitting up on the floor and this also lasted about four to five minutes, abating when the patient laid on the floor.  There is no history of any witnessed tonic-clonic activity, no history of incontinence of urine or biting of her tongue.  The patient denies any use of herbal medicine or nonprescription medication. She was recently started on Lexapro about a week ago, and spironolactone/HCTZ about three weeks ago by Dr. Leslie Dales, the patients endocrinologist, for fluid retention/edema felt related to Actos.  She also gives history of heavy menstrual bleeding for about three months now for which she has seen her primary care physician, Dr. Cliffton Asters, whereupon workup with ultrasound was unremarkable and was being controlled with Ortho-Cyclen and Ortho Evra patch. She uses the patch concurrently because of her menstruation.  She has been bleeding heavily for the past two weeks.  She changes her pads about eight times daily.  The patient denies any associated premonitory symptoms.  No new pain or anxiety or any discomfort prior to the episodes.  According to the husband, the patient had a similar episode about four years ago and workup at the primary physicians office was unremarkable.  I spoke to the patients primary care physician, Dr. Laurann Montana, who indicated that the patients baseline blood pressure was 102/82 during her  last visit about four days ago.  Her hemoglobin was 14.2 in January 2003 and this came down to 9.6 g/dl on Nov 18, 2001 during her hospital visit.  She was managed appropriately and the repeat hemoglobin three days ago - on January 16, 2002 - was 13.8 at the office.  PAST MEDICAL HISTORY:  Is as noted in the problem list above.  MEDICATION HISTORY: 1. Ortho Evra patch. 2. Spironolactone/HCTZ 35/75 one tablet p.o. q.d. 3. Amaryl 4 mg p.o. q.12h. 4. Metformin 1000 mg  p.o. q.12h. 5. Actos 30 mg p.o. q.d. 6. Humulin insulin 20 units subcutaneously q.p.m.  ALLERGIES:  No drug allergies.  FAMILY HISTORY:  Not significant for any heart disease.  SOCIAL HISTORY:  She is married, has three children, lives with her husband. Works at [employer] as a Chartered certified accountant.  Does not smoke cigarettes nor use alcohol.  REVIEW OF SYSTEMS:  Significant positive and negatives are as noted in the HPI above; otherwise, review of systems was unremarkable.  PHYSICAL EXAMINATION:  VITAL SIGNS:  Blood pressure 109/69, corresponding heart rate 69 per minute on lying; blood pressure 124/62 with documented pulse rare of 84 on standing. When I saw her, the monitor indicated a blood pressure of 85/67 mmHg with heart rate increasing from 67 to 98 per minute on standing up.  She had received about 1 liter of normal saline prior to my evaluation at the ED.  Her respiratory rate was 20 per minute, temperature 97.7 degrees Fahrenheit, O2 saturation of 98% on room air.  GENERAL:  She was not in acute cardiopulmonary distress.  She was moderately obese, and she complained of "swimming" feeling in her head while standing but denied any overt dizziness.  HEENT:  She was mildly pale.  She was not icteric.  Her oropharynx was moist. TMs were unremarkable.  NECK:  Supple.  No JVD.  No thyromegaly was appreciated.  LUNGS:  Unremarkable.  She had vesicular breath sounds which were adequate.  CARDIAC:  PMI was not palpable.  She had S1, S2 without any gallops or murmurs.  ABDOMEN:  Obese, nontender, no hepatosplenomegaly.  EXTREMITIES:  No edema was appreciated.  Dorsalis pedis pulses were present, about 1+.  NEUROLOGIC:  She was alert and oriented x3.  LABORATORY DATA:  Wbc count 6.5, hemoglobin 12.1, hematocrit 36.5, MCV 86.7, platelet count 246.  EKG:  Normal sinus rhythm without any acute ST-T wave changes.  Head CT was  done by the EDP which was negative; official report  pending and will need to be followed up.  ASSESSMENT:  A 45 year old Caucasian lady who presents with a syncopal episode x5 this morning.  The patient gives obvious history of her menorrhagia for the past three months for which she had been worked up and appeared unremarkable, being controlled with estrogen.  The history was notable for a possible hypotension with cerebral hypoperfusion on account of the fact that episodes recurred while the patient was lifted up into the standing position by her husband.  However, her hemoglobin level being 12.1 g/dl is quite puzzling.  I suspect that if this patient had lost so much blood - not acutely over the last 24 hours or so but for two weeks or so - then I would expect the hemoglobin to be far less than the current level.  I discussed the situation with the gynecologist who agreed with my thoughts, and she is going to see the patient if discharge in the next 48 hours, in her office.  We are going to start her on Provera 10 mg p.o. q.d. now and repeat this evening, and then 10 mg p.o. q.d. thereafter.  The issue with this patients hemoglobin may be explained by the fact that the patient is also taking diuretics as mentioned above, which was recently started by her endocrinologist, Dr. Leslie Dales, for fluid retetion, and this may be causing hemoconcentration with apparently relatively higher level of hemoglobin than expected.  The patient is going to be admitted to a telemetry bed.  We will monitor her rhythm and do frequent monitoring of her blood pressures.  We will obtain 2-D echo to rule out any structural abnormalities to account for this, for completeness sake.  The fact that the patient had a similar episode four years ago without any history of menorrhagia then is worrisome for possible seizures.  The activities were notable for loss of consciousness four about three to five minutes and therefore seizures cannot be completely ruled out.   If further workup this hospitalization is unremarkable, obtaining an EEG will not be unreasonable. Dictated by:   Jackie Plum, M.D. Attending Physician:  Selina Cooley DD:  01/19/02 TD:  01/21/02 Job: [id number]
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
## 2010-12-01 NOTE — H&P (Signed)
NAME:  JANELL, KEELING NO.:  [id number]   MEDICAL RECORD NO.:  [id number]                   PATIENT TYPE:  INP   LOCATION:  NA                                   FACILITY:  [facility]   PHYSICIAN:  Janine Limbo, M.D.            DATE OF BIRTH:  1966/05/21   DATE OF ADMISSION:  08/19/2003  DATE OF DISCHARGE:                                HISTORY & PHYSICAL   HISTORY OF PRESENT ILLNESS:  Ms. Fifer is a 45 year old female, gravida 5,  para 3-0-1-3, who presents at [redacted] weeks gestation (EDC is August 31, 2003)  for a repeat cesarean section.  The patient has been followed at the [facility].  This pregnancy has been complicated by type 2 diabetes mellitus.  The patient is currently on 14 units of NPH insulin, and 14 units of regular  insulin each morning.  She uses 14 units of regular insulin at lunchtime.  She uses 26 units of regular insulin before supper.  She uses 26 units of  NPH insulin at bedtime.  Her sugars have been moderately well controlled.  The patient has been monitored during her third trimester.  The patient has  also had a prior cesarean section.  Her age is greater than 35 years, and  she declined amniocentesis.  The patient has a history of depression, and  she is currently taking Zoloft.  She is also obese (weight 268 pounds).   OBSTETRICAL HISTORY:  In 1986, the patient had an 8 pound, 11 ounce female  infant.  In 1998, the patient had a first trimester miscarriage.  In 1998,  the patient had a 7 pound, 2 ounces female infant at [redacted] weeks gestation.  In  2001, the patient had an 8 pound, 5 ounce female infant that was delivered by  cesarean section secondary to a cervical spine fracture.   ALLERGIES:  No known drug allergies.   PAST MEDICAL HISTORY:  Please see history of present illness.  The patient  has a history of syncopal episodes that was diagnosed in July of  2003.   SOCIAL HISTORY:  The patient denies cigarette use, alcohol use, and  recreational drug use.   REVIEW OF SYSTEMS:  Normal pregnancy complaints.  The patient does complain  of pelvic pain of uncertain etiology.   FAMILY HISTORY:  Noncontributory.   PHYSICAL EXAMINATION:  VITAL SIGNS:  Weight is 268 pounds.  HEENT:  Within normal limits.  CHEST:  Clear.  HEART:  Regular rate and rhythm.  BREASTS:  Without masses.  ABDOMEN:  Gravid with a fundal height of 40 cm.  EXTREMITIES:  Within normal limits.  NEUROLOGIC:  Grossly normal.  PELVIC EXAM:  The cervix was closed and long with the last check.   LABORATORY VALUES:  Blood type was O positive, antibody screen negative,  VDRL nonreactive, rubella immune.  HBSAG negative.  GC negative.  Chlamydia  negative.  Pap within normal limits.  Cystic fibrosis within normal limits.  Third trimester beta strep is negative.  Third trimester gonorrhea negative.  Third trimester Chlamydia negative.   ASSESSMENT:  1. Thirty-eight weeks gestation.  2. Type 2 gestational diabetes.  3. Prior cesarean section.  4. Desires repeat cesarean section.  5. Desires sterilization.   PLAN:  The patient will undergo a repeat low transverse cesarean section and  bilateral tubal ligation.  She understands the indications for her  procedure, and she accepts the risk of, but not limited to, anesthetic  complications, bleeding, infections, and possible damage to the surrounding  organs.                                               Janine Limbo, M.D.    AVS/MEDQ  D:  08/15/2003  T:  08/15/2003  Job:  [id number]

## 2010-12-01 NOTE — Discharge Summary (Signed)
[place]  Patient:    Kristie [name], Kristie [name] Visit Number: [id number] MRN: [id number]          Service Type: MED Location: [id number] [phone] Attending Physician:  Selina Cooley Dictated by:   Selina Cooley, M.D. Admit Date:  01/19/2002 Discharge Date: 01/21/2002   CC:         Lafonda Mosses B. Thomasena Edis, M.D.  Veverly Fells. Altheimer, M.D.  Stacie Acres Cliffton Asters, M.D.   Discharge Summary  DISCHARGE DIAGNOSES: 1. Syncope, probably hypovolemic due to menorrhagia and diuretic use.    Telemetry with no evidence of arrhythmias, electrocardiogram normal,    echocardiogram normal with an ejection fraction of 55% to 65%. No valvular    or wall motion abnormalities. 2. Diabetes mellitus type 2 on 3 oral hypoglycemics and night time NPH    insulin. 3. Menorrhagia. 4. Mild normocytic anemia with a hemoglobin of 11.2 at discharge. 5. Obesity. 6. History of depression, anxiety.  DISCHARGE MEDICATIONS: 1. Stop diuretic hydrochlorothiazide, spironolactone. 2. Metformin 500 mg 2 tablets p.o. b.i.d. 3. Amaryl 4 mg p.o. b.i.d. 4. Actos 30 mg p.o. q.d. 5. NPH insulin 20 units subcu q.h.s. 6. Iron sulfate 325 mg p.o. t.i.d. 7. Provera 10 mg p.o. q.d. or per Dr. Thomasena Edis.  FOLLOWUP:  Follow up with Dr. Thomasena Edis today, January 21, 2002, at 2:15. Dr. Leslie Dales on Wednesday, January 28, 2002, at 11:45, and Dr. Cliffton Asters as already scheduled.  CONDITION ON DISCHARGE:  Stable.  REASON FOR ADMISSION: The patient is a 45 year old diabetic female who presented with several episodes of syncope as witnessed by her husband. The patient described no aura or postictal state. The patient did endorse a prior episode several years ago which was subsequently associated with a miscarriage. The patient denied any cardiac symptoms, including chest pain, pressure or palpitations. The husband described the patient loosing consciousness, regaining consciousness when supine and when he tried to lift her  back up she once again lost consciousness on several occasions during the course of the morning of admission. He subsequently called the EMS.  The patients presenting blood pressure was in the 80s.  HOSPITAL COURSE:  #1 - SYNCOPE:  Probably hypovolemic. The patient did describe several weeks of ongoing menorrhagia. Despite this her presenting hemoglobin was not significantly reduced (12). The patient was given IV fluids, placed on telemetry and cardiac enzymes were negative. There was a flow murmur noted on examination and the echocardiogram was within normal limits with an EF of 55% to 65%. No valvular or wall motion abnormalities noted. The patient had a negative pregnancy test. Hemoglobin at discharge after fluid resuscitation was 11.3, again not markedly reduced. The patients diuretics were also held. She was slightly symptomatic with light headedness on the day of admission. However, by the time of discharge, the patient was totally asymptomatic, ambulating without any problems. Telemetry and 12-lead electrocardiograms were within normal limits. The patient will be discharged with the advice to discontinue taking her diuretics until further notice. If her syncope were to recur, I would consider tilt-table testing. Also the patients blood pressure on discharge was approximately 100/60, which is reportedly low for her. If her blood pressure does remain low on followup, a morning cortisol level may also be merited.  #2 - DIABETES MELLITUS:  The patient is on three oral hypoglycemic as well as night time insulin. In addition her endocrinologist placed her on a diuretic pill to manage lower extremity edema attributed to Actos. I will defer further modifications of her  diabetic regimen to her endocrinologist, who she will see within a week. It may be prudent at this time to switch her to insulin and metformin, rather than managing side-effects from the Actos with diuretics.  #3 -  MENORRHAGIA:  The patient will have follow up with Dr. Thomasena Edis today. Her bleeding did improve during this hospitalization with the initiation of Provera. The patient has noted difficulty in taking daily pills, thus she has relied on Depo-Provera and no hormone patches in the past.  #4 - MILD NORMOCYTIC ANEMIA:  She had a discharge hemoglobin of 11.3, attributed to her menorrhagia. Dictated by:   Selina Cooley, M.D. Attending Physician:  Selina Cooley DD:  01/21/02 TD:  01/23/02 Job: [id number]
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
## 2010-12-01 NOTE — Discharge Summary (Signed)
NAME:  Kristie [name], Kristie [name]                          ACCOUNT NO.:  [id number]   MEDICAL RECORD NO.:  [id number]                   PATIENT TYPE:  INP   LOCATION:  9122                                 FACILITY:  [facility]   PHYSICIAN:  Osborn Coho, M.D.                DATE OF BIRTH:  October 07, 1965   DATE OF ADMISSION:  08/19/2003  DATE OF DISCHARGE:  08/22/2003                                 DISCHARGE SUMMARY   ADMITTING DIAGNOSES:  1. Term pregnancy.  2. Diabetes.  3. Prior cesarean section.  4. Desires sterilization.   PROCEDURE:  1. Repeat low transverse cesarean section.  2. Bilateral tubal sterilization.   DISCHARGE DIAGNOSES:  1. Term pregnancy.  2. Diabetes.  3. Prior cesarean section.  4. Desires sterilization.  5. Macrosomia.   HISTORY:  Kristie [name] is a 45 year old, gravida 5, para 3-0-1-3, who  presents at [redacted] weeks gestation for a repeat cesarean section.  She has been  followed by Nani Ravens, the M.D. service, for insulin-dependent diabetes.  Her  blood sugars have been moderately well controlled with her insulin.  She  also has a history of depression, for which she took Zoloft through her  pregnancy.  Her pregnancy was also significant for obesity.  She was  admitted on August 19, 2003 for repeat cesarean delivery by Dr. Marline Backbone with the birth of a 9 pound, 1 ounce female infant named Charlane Ferretti,  with Apgar scores at 8 at 1 minute, 8 at 5 minutes.  Both patient and infant  have done well in the postoperative period.  The patient's vital signs have  remained stable, and her blood sugars have remained in the normal range on  the insulin dosage as ordered.  Her JP drained minimal amounts, and was  removed on the second postoperative day by Dr. Su Hilt.  Her hemoglobin  remained stable, and on the first postoperative day it was 10.5.  On this,  her third postoperative day, she is judged to be in satisfactory condition  for discharge.   DISCHARGE INSTRUCTIONS:  Therefore,  she is discharged home with instructions  per [facility].   DISCHARGE MEDICATIONS:  She will maintain the insulin dosage as in the  hospital, 16 units NPH insulin at bedtime, and 8 units NPH in the morning, 8  units regular insulin at bedtime, and 12 units regular insulin prior to  supper.   FOLLOW UP:  1. She will call tomorrow to make an appointment with her M.D. for diabetes     management, and is to be seen with     him within the next week.  2. She has a prescheduled appointment with her cardiologist for tomorrow,     which she does plan to keep.  3. She will follow up at [facility] in 6 weeks.     Rica Koyanagi, C.N.M.  Osborn Coho, M.D.    SDM/MEDQ  D:  08/22/2003  T:  08/23/2003  Job:  [id number]

## 2010-12-01 NOTE — Procedures (Signed)
[facility]  Patient:    Kristie [name], Kristie [name] Visit Number: [id number] MRN: [id number]          Service Type: CAT Location: [facility] 2857 [id number] Attending Physician:  Armanda Magic Dictated by:   Armanda Magic, M.D. Proc. Date: 02/06/02 Admit Date:  02/06/2002 Discharge Date: 02/06/2002   CC:         Selina Cooley, M.D.   Procedure Report  CHIEF COMPLAINT:  The patient is a very pleasant 45 year old who presents for further evaluation of multiple syncopal episodes on one separate occasion. She also has a history of diabetes.  PROCEDURE PERFORMED:  Tilt table test.  OPERATOR:  Armanda Magic, M.D.  INDICATIONS FOR PROCEDURE:  Syncope.  DESCRIPTION OF PROCEDURE:  The patient was brought to the cardiac catheterization laboratory in a fasted nonsedated state.  Informed consent was obtained.  The patient was connected to the continuous heart rate and pulse oximetry monitor and intermittent blood pressure monitoring.  The patients blood pressure was measured in a supine position for five minutes.  Baseline blood pressure was 114/74 to 126/71 with heart rates in the 80s.  The patient was then tilted upright to 70 degrees at 19 minutes into the upright tilt. The patient started feeling dizzy.  Blood pressure was 121/57 with heart rate 97.  She then became severely dizzy and said her head was swimming.  Blood pressure dropped to 70/47 mHg.  Heart rate was initially 88 and then dropped and then there was a long pause.  The patients blood pressure remained at 70/47 with a heart rate of 28 and the patient became pale and passed out.  She was then placed supine with rapid resolution of her blood pressure and heart rate to pretilt readings.  ASSESSMENT: 1. Syncope. 2. Positive tilt table test for vasovagal syncope.  PLAN: 1. Start Paxil 20 mg a day. 2. Toprol XL 25 mg a day. 3. She will follow up with me in one to two weeks. Dictated by:   Armanda Magic,  M.D. Attending Physician:  Armanda Magic DD:  02/09/02 TD:  02/10/02 Job: [id number]
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
## 2010-12-01 NOTE — Discharge Summary (Signed)
[facility]  Patient:    Kristie [name], Kristie [name]                       MRN: [id number] Adm. Date:  [id number] Disc. Date: [id number] Attending:  Shaune Spittle Dictator:   Nigel Bridgeman, C.N.M.                           Discharge Summary  ADMITTING DIAGNOSES:          1. Intrauterine pregnancy at 38 weeks.                               2. Insulin-dependent gestational diabetes.                               3. History of cervical spine fracture.  DISCHARGE DIAGNOSES:          1. Intrauterine pregnancy at 38 weeks.                               2. Insulin dependent gestational diabetes.                               3. Cervical neck fracture.                               4. Small uterine fibroids.  PROCEDURES:                   1. Primary low transverse cesarean section.                               2. Combination epidural and spinal anesthesia.  HOSPITAL COURSE:              Kristie [name] is a 45 year old gravida 4, para 2-0-1-2 at 35 weeks, who presented for scheduled cesarean section secondary to a history of a cervical spine fracture at approximately 36 weeks. Consultation was held with the patients neurosurgeon, Dr. Dierdre Forth as primary attending physician, and with Dr. Laqueta Due as anesthesiologist to determine the best method of delivery.  The decision was made to recommend a low transverse cesarean section under regional anesthesia.  This was performed on March 06, 2000.  Findings were a viable female by the name of [name], weight 8 pounds 5 ounces.  Apgars were 7 and 8.  Dr. Pennie Rushing was the primary surgeon.  Dr. Stefano Gaul was the first assistant.  Anesthesia was a combination epidural and spinal.  Estimated blood loss was 750 cc.  The patient tolerated the procedure well.  There were several small fibroids noted.  By postoperative day one, the patient was doing well.  She was using Motrin for pain.  Her hemoglobin was 10.1.  Her  fasting blood sugars were 90.  CBGs were within normal limits.  She did have some mild dizziness upon ambulation after narcotic medication on day two but did well subsequently.  Infant was in the MICU secondary to respiratory distress and was initially intubated and then began improving over the next 24 hours.  The patient continued  to do well. Her incision remained clean, dry, and intact.  Her cervical spine injury was managed with a [place] collar, and the patient did well with this.  By postoperative day three, she was up ad lib.  She responded with some dizziness when she had p.o. narcotics for pain; therefore, she was managing with Motrin. Infant was doing better.  Patient was anticipating follow-up consult with a second opinion neurosurgeon.  Her vital signs were stable.  She was afebrile. Her incision was clean, dry, and intact, with slight bruising on the upper margin.  The patient was deemed to have received benefit of her hospital stay after consultation with Dr. Cleatrice Burke as todays physician.  She was therefore discharged home.  DISCHARGE INSTRUCTIONS:       Presented [facility] handout and per her neurosurgeons previous instructions.  DISCHARGE MEDICATIONS:        1. Motrin 600 mg p.o. q.6h. p.r.n. pain.                               2. Darvocet 1-2 p.o. q.3-4h. p.r.n. pain.                               3. Micronor 1 p.o. q.d.                               4. Chromagen 1 p.o. q.d.                               5. Prenatal vitamin 1 p.o. q.d.  DISCHARGE FOLLOW-UP:          In six weeks at [facility].  The patient will continue her follow-up plan with the neurosurgeons of record to determine the plan of management for her cervical spine injury. DD:  03/09/00 TD:  03/11/00 Job: [id number]

## 2010-12-01 NOTE — Op Note (Signed)
[facility]  Patient:    Kristie [name], Kristie [name]                       MRN: [id number] Proc. Date: 03/06/00 Adm. Date:  [id number] Attending:  Dierdre Forth [place]                           Operative Report  PREOPERATIVE DIAGNOSES:       1. Intrauterine pregnancy at [redacted] weeks                                  gestation.                               2. Insulin-dependent gestational diabetes.                               3. Cervical neck fracture.  POSTOPERATIVE DIAGNOSES:      1. Intrauterine pregnancy at [redacted] weeks                                  gestation.                               2. Insulin-dependent gestational diabetes.                               3. Cervical neck fracture.                               4. Small uterine fibroids.  OPERATION:                    Primary low transverse cesarean section.  SURGEON:                      Vanessa P. Pennie Rushing, M.D.  ASSISTANT:                    Janine Limbo, M.D.  ANESTHESIA:                   Combination epidural and spinal.  ESTIMATED BLOOD LOSS:         Seven-hundred-fifty cc.  COMPLICATIONS:                None.  FINDINGS:                     The patient was delivered of a female infant whose name is [name] weighing 8 pounds 5 ounces with Apgars of 7 and 8 at one and five minutes respectively.  The uterus contained several small uterine fibroids, the largest of which was 3 cm at the right cornual region.  The ovaries were normal for the gravid state as were the tubes.  PREOPERATIVE DISCUSSION:      Several discussions had been held with the patient and her husband concerning the recommendation for cesarean section because of the instability of her cervical fracture.  This recommendation had been  made initially by her neurosurgeon and confirmed by the anesthesia division who felt that the safest anesthetic would be a conduction anesthetic under absolutely controlled  circumstances.  DESCRIPTION OF PROCEDURE:     The patient was brought to the operating room after appropriate identification and the above discussion being confirmed, and placed on the operating table.  A spinal and epidural anesthetic were placed, and she was placed in the supine position with a left lateral tilt.  After the assurance of adequate anesthesia the abdomen was prepped with multiple layers of Betadine and the perineum prepped to allow insertion of a Foley catheter under sterile conditions.  The abdomen was draped as a sterile field and a transverse incision made in the abdomen.  The abdomen was opened in layers and the peritoneum entered.  The visceral peritoneum overlying the uterus was incised and that incision taken laterally on either side with the bladder flap being bluntly developed.  The uterus was incised in the midline and that incision taken laterally on either side.  The amniotic membranes were ruptured and clear fluid egressed.  The infant was delivered from the occiput transverse position with the aide of a Mityvac vacuum extractor, and after having the nares and pharynx suctioned, and the cord clamped and cut was handed off to the awaiting pediatricians.  The appropriate cord blood was drawn and the placenta spontaneously separated from the uterus, and was then removed.  The uterine incision was closed with a running interlocking suture of 0-Vicryl.  An imbricating suture of 0-Vicryl was placed.  Hemostasis was achieved with repair of the bladder flap layer with suture of 2-0 Vicryl. Copious irrigation was carried out and hemostasis noted to be adequate.  The abdominal peritoneum was was closed with a running suture of 2-0 Vicryl.  The rectus muscles were reapproximated in the midline with figure-of-eight sutures of 2-0 Vicryl.  The rectus muscles were noted to be hemostatic and irrigated. The rectus fascia was closed with a running suture of 0-Vicryl then  reinforced on either side of the midline with figure-of-eight sutured of 0-Vicryl.  The subcutaneous tissue was irrigated and made hemostatic with Bovie cautery. Skin staples were applied to the skin incision.  A sterile dressing was applied and the patient was taken from the operating room to the recovery room in satisfactory condition having tolerated the procedure well with sponge and instrument counts correct. DD:  03/06/00 TD:  03/06/00 Job: [id number]

## 2010-12-01 NOTE — Op Note (Signed)
NAME:  Kristie [name], Kristie [name] NO.:  [id number]   MEDICAL RECORD NO.:  [id number]                   PATIENT TYPE:  INP   LOCATION:  9199                                 FACILITY:  [facility]   PHYSICIAN:  Janine Limbo, M.D.            DATE OF BIRTH:  [birth year]   DATE OF PROCEDURE:  08/19/2003  DATE OF DISCHARGE:                                 OPERATIVE REPORT   PREOPERATIVE DIAGNOSES:  1. Term intrauterine pregnancy.  2. Gestational diabetes requiring insulin.  3. Prior cesarean section.  4. Desires repeat cesarean section.  5. Desires sterilization.   POSTOPERATIVE DIAGNOSES:  1. Term intrauterine pregnancy.  2. Gestational diabetes requiring insulin.  3. Prior cesarean section.  4. Desires repeat cesarean section.  5. Desires sterilization.  6. Fibroid uterus.   PROCEDURES:  1. Repeat low transverse cesarean section.  2. Bilateral tubal ligation.   SURGEON:  Janine Limbo, M.D.   FIRST ASSISTANT:  Renaldo Reel. Emilee Hero, C.N.M.   ANESTHESIA:  Spinal.   DISPOSITION:  Ms. Odaniel is a 45 year old female, gravida 5, para 3-0-1-3,  who presents with the above-mentioned diagnoses.  She understands the  indications for her procedure and she accepts the risks of, but not limited  to, anesthetic complications, bleeding, infections, possible damage to the  surrounding organs, and possible tubal failure (17 per 1000).   FINDINGS:  A 9 pound 1 ounce female infant (Emale) was delivered from a  cephalic presentation.  The Apgars were 8 at one minute and 8 at five  minutes.  The fallopian tubes and ovaries were normal for the gravid state.  There was a 2 cm fibroid on the left anterior uterus.   DESCRIPTION OF PROCEDURE:  The patient was taken to the operating room,  where a spinal anesthetic was given.  The patient's abdomen and perineum  were prepped with multiple layers of Betadine.  A Foley catheter was placed  in the bladder.  The patient was  sterilely draped.  The lower abdomen was  injected with 10 mL of 0.5% Marcaine with epinephrine.  A low transverse  incision was made and carried sharply through the subcutaneous tissue, the  fascia, and the anterior peritoneum.  An incision was made in the lower  uterine segment and the bladder flap was developed.  The incision was  extended in a low transverse fashion.  The fetal head was delivered without  difficulty.  The mouth and nose were suctioned and the remainder of the  infant was delivered.  The cord was clamped and cut and the infant was  handed to the waiting pediatric team.  Routine cord blood studies were  obtained.  The placenta was removed.  The uterine cavity was cleaned of  amniotic fluid, clotted blood, and membranes.  The uterine incision was  closed using a running locking suture of 2-0 Vicryl.  Hemostasis was  adequate.  The left fallopian  tube was identified and followed to its  fimbriated end.  A knuckle of tube was made on the left using a free tie and  then a suture ligature of 0 plain catgut.  The knuckle of tube thus made was  excised.  Hemostasis was adequate.  An identical procedure was carried out  on the opposite side.  Again hemostasis was adequate.  The abdominal cavity  was vigorously irrigated.  Hemostasis was adequate throughout.  The anterior  peritoneum and the abdominal musculature were reapproximated in the midline  using 2-0 Vicryl.  The fascia and the subcutaneous layer were irrigated.  The fascia was closed using a running suture of 0 Vicryl, followed by three  interrupted sutures of 0 Vicryl.  A Jackson-Pratt drain was placed in the  subcutaneous layer and brought out through the left lower quadrant.  It was  sutured into place using silk suture.  The subcutaneous layer was closed  using a running suture of 0 Vicryl.  The skin was reapproximated using a  subcuticular stitch of 3-0 Vicryl.  Sponge, needle, and instrument counts  were correct on  two occasions.  The estimated blood loss was 800 mL.  The  patient had tolerated her procedure well.  She was noted to drain clear  yellow urine at the end of her procedure.  She was taken to the recovery  room in stable condition.  The infant was taken to the full-term nursery in  stable condition.                                               Janine Limbo, M.D.    AVS/MEDQ  D:  08/19/2003  T:  08/19/2003  Job:  [id number]

## 2010-12-01 NOTE — H&P (Signed)
[facility]  Patient:    Kristie [name], Kristie [name]                        MRN: [id number] Adm. Date:  02/19/00 Attending:  Erie Noe P. Pennie Rushing, M.D. Dictator:   Nigel Bridgeman, C.N.M.                         History and Physical  PRENATAL LABORATORY DATA:     Blood type O positive.  Rh antibody negative. RPR nonreactive.  Rubella titer positive.  Hepatitis B surface antigen negative.  Hemoglobin upon entering the practice was 12.9.  Initial urine culture at first OB visit showed greater than 100,000 colonies per ml of multiple species.  GC and Chlamydia cultures were negative at the first OB visit.  Group B Strep culture was positive from February 19, 2000. DD:  02/20/00 TD:  02/20/00 Job: [id number]

## 2011-02-10 NOTE — Progress Notes (Signed)
  Subjective:    Patient ID: Kristie [name], female    DOB: June 15, 1966, 45 y.o.   MRN: [id number]  HPI    Review of Systems     Objective:   Physical Exam        Assessment & Plan:   No problem-specific assessment & plan notes found for this encounter.

## 2011-07-13 ENCOUNTER — Encounter: Payer: Self-pay | Admitting: Family Medicine

## 2011-07-13 ENCOUNTER — Ambulatory Visit (INDEPENDENT_AMBULATORY_CARE_PROVIDER_SITE_OTHER): Payer: Medicare FFS | Admitting: Family Medicine

## 2011-07-13 VITALS — BP 107/72 | HR 90 | Temp 97.1°F | Ht 67.5 in | Wt 179.0 lb

## 2011-07-13 DIAGNOSIS — J019 Acute sinusitis, unspecified: Secondary | ICD-10-CM

## 2011-07-13 MED ORDER — HYDROCODONE-HOMATROPINE 5-1.5 MG/5ML PO SYRP: ORAL_SOLUTION | ORAL | Status: AC

## 2011-07-13 MED ORDER — AMOXICILLIN-POT CLAVULANATE 875-125 MG PO TABS: 1.0000 | ORAL_TABLET | Freq: Two times a day (BID) | ORAL | Status: AC

## 2011-07-13 NOTE — Progress Notes (Signed)
OFFICE NOTE  07/13/2011  CC:  Chief Complaint  Patient presents with  . URI    cough, sore throat, feels like something stuck in throat     HPI: Patient is a 45 y.o. Caucasian female who is a patient of Dr. Rodena Medin that is here for ST. Onset 3 wks ago, scratchy throat, lost voice, then ST, cough, achy, f/c.  Nausea but no vomiting.  NO appetite.  Dizzy/lightheaded easy. HA/face pain.  Hasn't had flu vaccine this season.   Ibuprofen not helping much, no other meds tried b/c she's scared to mix anything with her victoza. Glucs good on victoza (low 100s).    ROS: no rash, no neck stiffness, no abd pain, no joint swelling or redness.  Pertinent PMH:  DM 2 Dep/anx Hyperlipidemia  Pertinent Meds:  PE: Blood pressure 107/72, pulse 90, temperature 97.1 F (36.2 C), temperature source Temporal, height 5' 7.5" (1.715 m), weight 179 lb (81.194 kg), SpO2 93.00%. VS: noted--normal. Gen: alert, NAD, NONTOXIC APPEARING. HEENT: eyes without injection, drainage, or swelling.  Ears: EACs clear, TMs dull and mildly erythematous bilat.  Nose: Clear rhinorrhea, with some dried, crusty exudate adherent to mildly injected and edematous mucosa.  No purulent d/c.  No paranasal sinus TTP.  No facial swelling.  Throat and mouth without focal lesion.  No pharyngial swelling, erythema, or exudate.   Neck: supple, no LAD.   LUNGS: CTA bilat, nonlabored resps.   CV: RRR, no m/r/g. EXT: no c/c/e SKIN: no rash    IMPRESSION AND PLAN: Prolonged resp illness, mild bilat AOM. Will treat with augmentin 875mg  bid x 10d, hycodan q6h prn, allegra D OTC prn, nasal saline spray tid. Therapeutic expectations and side effect profile of medication discussed today.  Patient's questions answered.   FOLLOW UP: prn

## 2011-07-13 NOTE — Patient Instructions (Signed)
Allegra D generic, take as directed.  Buy one small box. Buy nasal saline spray and use 2-3 sprays each nostril 2-3 times a day.

## 2011-07-30 ENCOUNTER — Ambulatory Visit (INDEPENDENT_AMBULATORY_CARE_PROVIDER_SITE_OTHER): Payer: Medicare Other | Admitting: *Deleted

## 2011-07-30 ENCOUNTER — Telehealth: Payer: Self-pay | Admitting: Family Medicine

## 2011-07-30 DIAGNOSIS — Z23 Encounter for immunization: Secondary | ICD-10-CM

## 2011-07-30 NOTE — Telephone Encounter (Signed)
I have attempted to contact this patient by phone with the following results: left message to return my call on answering machine (home).  

## 2011-07-30 NOTE — Telephone Encounter (Signed)
Patient is passing very large clots during menses, is this something that Dr Milinda Cave can see her for or does he recommend she see a GYN? She has a GYN doctor but would like to switch to someone else if Dr Milinda Cave wants her to be seen by GYN.

## 2011-08-07 ENCOUNTER — Ambulatory Visit (INDEPENDENT_AMBULATORY_CARE_PROVIDER_SITE_OTHER): Payer: Medicare Other | Admitting: Family Medicine

## 2011-08-07 ENCOUNTER — Encounter: Payer: Self-pay | Admitting: Family Medicine

## 2011-08-07 DIAGNOSIS — R5383 Other fatigue: Secondary | ICD-10-CM

## 2011-08-07 DIAGNOSIS — F411 Generalized anxiety disorder: Secondary | ICD-10-CM

## 2011-08-07 DIAGNOSIS — E785 Hyperlipidemia, unspecified: Secondary | ICD-10-CM

## 2011-08-07 DIAGNOSIS — D509 Iron deficiency anemia, unspecified: Secondary | ICD-10-CM

## 2011-08-07 DIAGNOSIS — F3289 Other specified depressive episodes: Secondary | ICD-10-CM

## 2011-08-07 DIAGNOSIS — N920 Excessive and frequent menstruation with regular cycle: Secondary | ICD-10-CM

## 2011-08-07 DIAGNOSIS — IMO0001 Reserved for inherently not codable concepts without codable children: Secondary | ICD-10-CM

## 2011-08-07 DIAGNOSIS — F329 Major depressive disorder, single episode, unspecified: Secondary | ICD-10-CM

## 2011-08-07 DIAGNOSIS — R5381 Other malaise: Secondary | ICD-10-CM

## 2011-08-07 LAB — COMPREHENSIVE METABOLIC PANEL
ALT: 21 U/L (ref 0–35)
Albumin: 4.2 g/dL (ref 3.5–5.2)
CO2: 14 mEq/L — ABNORMAL LOW (ref 19–32)
Calcium: 8.7 mg/dL (ref 8.4–10.5)
Chloride: 100 mEq/L (ref 96–112)
Creatinine, Ser: 0.9 mg/dL (ref 0.4–1.2)
GFR: 71.75 mL/min (ref >60.00)

## 2011-08-07 LAB — CBC WITH DIFFERENTIAL/PLATELET
Basophils Relative: 0.5 % (ref 0.0–3.0)
Eosinophils Relative: 1.2 % (ref 0.0–5.0)
Hemoglobin: 12 g/dL (ref 12.0–15.0)
Lymphocytes Relative: 31.5 % (ref 12.0–46.0)
Monocytes Relative: 10.4 % (ref 3.0–12.0)
Neutro Abs: 4.5 10*3/uL (ref 1.4–7.7)
Neutrophils Relative %: 56.4 % (ref 43.0–77.0)
RBC: 4.69 Mil/uL (ref 3.87–5.11)
WBC: 8 10*3/uL (ref 4.5–10.5)

## 2011-08-07 LAB — MICROALBUMIN / CREATININE URINE RATIO: Microalb Creat Ratio: 3.6 mg/g (ref 0.0–30.0)

## 2011-08-07 LAB — LIPID PANEL
Cholesterol: 180 mg/dL (ref 0–200)
Total CHOL/HDL Ratio: 4
Triglycerides: 136 mg/dL (ref 0.0–149.0)

## 2011-08-07 LAB — HEMOGLOBIN A1C: Hgb A1c MFr Bld: 10.1 % — ABNORMAL HIGH (ref 4.6–6.5)

## 2011-08-07 MED ORDER — FLUTICASONE PROPIONATE 50 MCG/ACT NA SUSP: NASAL | Status: DC

## 2011-08-07 MED ORDER — INSULIN ASPART 100 UNIT/ML ~~LOC~~ SOLN: 5.0000 [IU] | Freq: Three times a day (TID) | SUBCUTANEOUS | Status: DC

## 2011-08-07 MED ORDER — CLONAZEPAM 1 MG PO TABS: ORAL_TABLET | ORAL | Status: DC

## 2011-08-07 NOTE — Patient Instructions (Signed)
Restart lantus at 20 Units at bedtime. Start novolog insulin 5 Units at each meal. Check glucose when fasting each morning and prior to each meal and at bedtime (4 times per day). Restart effexor XR 150mg  once daily x 7d, then take 1 twice daily. Make appt with your GYN to go over the possibility of IUD again.
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
## 2011-08-07 NOTE — Telephone Encounter (Signed)
Pt did not return call.  Pt was seen in office today and this was discussed.

## 2011-08-07 NOTE — Progress Notes (Addendum)
Quick Note:  Called pt to notify her of abnl labs. Since she does have AG Met Acidosis, I told her to stop her metformin for now. We discussed more aggressive lantus and novolog titration over the next few days, emphasized the importance of hydration/water intake. I told her to drop her sugar numbers by our office in 3d so I can review them. Keep f/u appt for 1 wk. ______

## 2011-08-08 DIAGNOSIS — F411 Generalized anxiety disorder: Secondary | ICD-10-CM | POA: Insufficient documentation

## 2011-08-08 NOTE — Assessment & Plan Note (Signed)
Hx of this. Will recheck CBC today.  She is not on iron supplement at this time.

## 2011-08-08 NOTE — Assessment & Plan Note (Signed)
Encouraged pt to call her OB/GYN for appt and encouraged her to pursue the IUD that was offered as an option.

## 2011-08-08 NOTE — Assessment & Plan Note (Signed)
Poor control of depression and anxiety. Stressed the importance of getting a grip on her own health problems as a priority over the other things in her life right now. Restart Effexor XR 150mg  once daily x 7d, then increase back to her previous dosing of 150mg  bid.

## 2011-08-08 NOTE — Assessment & Plan Note (Signed)
Poor control. She is has built up tolerance to her small Benzo dose, so will increase this to clonazepam 1mg  tabs and continue 1 qAM, 1 q2pm, and 2 qhs. Restart effexor xr as stated above.

## 2011-08-08 NOTE — Assessment & Plan Note (Signed)
She has lost 10 lbs in the last several weeks, total of 50 lbs in the last 2 and 1/2 mo per pt=SHE IS OUT OF INSULIN, essentially is type 1 diabetic and is on NO TREATMENT lately by her own choosing.   Discussed this in detail today, emphasized the importance of accepting that despite insulin not helping much in the past for her, she essentially just needs MORE in order to get the job done.   Plan is to d/c victoza.  Continue metformin for now.  Restart lantus at 20 U qhs and start novolog at 5 U each meal.  Continue checking gluc fasting and with each meal and hs (4 X/day).   We'll check HbA1c, CMET, TSH, urine microalb/cr today. F/u in office 1 wk.

## 2011-08-14 ENCOUNTER — Ambulatory Visit (INDEPENDENT_AMBULATORY_CARE_PROVIDER_SITE_OTHER): Payer: Medicare Other | Admitting: Family Medicine

## 2011-08-14 ENCOUNTER — Encounter: Payer: Self-pay | Admitting: Family Medicine

## 2011-08-14 VITALS — BP 109/75 | HR 92 | Ht 67.5 in | Wt 180.0 lb

## 2011-08-14 DIAGNOSIS — E872 Acidosis, unspecified: Secondary | ICD-10-CM

## 2011-08-14 LAB — BASIC METABOLIC PANEL
BUN: 17 mg/dL (ref 6–23)
CO2: 27 mEq/L (ref 19–32)
Calcium: 8.6 mg/dL (ref 8.4–10.5)
Creatinine, Ser: 0.8 mg/dL (ref 0.4–1.2)
Glucose, Bld: 365 mg/dL — ABNORMAL HIGH (ref 70–99)
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
## 2011-09-04 ENCOUNTER — Ambulatory Visit: Payer: Medicare Other | Admitting: Family Medicine

## 2011-09-10 ENCOUNTER — Other Ambulatory Visit (HOSPITAL_BASED_OUTPATIENT_CLINIC_OR_DEPARTMENT_OTHER): Payer: Self-pay | Admitting: Emergency Medicine

## 2011-09-10 DIAGNOSIS — Z1231 Encounter for screening mammogram for malignant neoplasm of breast: Secondary | ICD-10-CM

## 2011-09-13 ENCOUNTER — Ambulatory Visit: Payer: Medicare Other | Admitting: Family Medicine

## 2011-09-20 ENCOUNTER — Ambulatory Visit (HOSPITAL_BASED_OUTPATIENT_CLINIC_OR_DEPARTMENT_OTHER)
Admission: RE | Admit: 2011-09-20 | Discharge: 2011-09-20 | Disposition: A | Discharge status: Home / Self Care | Payer: Medicare Other | Source: Ambulatory Visit | Attending: Emergency Medicine | Admitting: Emergency Medicine

## 2011-09-20 DIAGNOSIS — Z1231 Encounter for screening mammogram for malignant neoplasm of breast: Secondary | ICD-10-CM

## 2011-09-27 ENCOUNTER — Ambulatory Visit (INDEPENDENT_AMBULATORY_CARE_PROVIDER_SITE_OTHER): Payer: Medicare Other | Admitting: Family Medicine

## 2011-09-27 ENCOUNTER — Encounter: Payer: Self-pay | Admitting: Family Medicine

## 2011-09-27 VITALS — BP 95/69 | HR 101 | Temp 99.1°F | Ht 67.5 in | Wt 181.0 lb

## 2011-09-27 DIAGNOSIS — R5383 Other fatigue: Secondary | ICD-10-CM | POA: Insufficient documentation

## 2011-09-27 DIAGNOSIS — F411 Generalized anxiety disorder: Secondary | ICD-10-CM

## 2011-09-27 DIAGNOSIS — N920 Excessive and frequent menstruation with regular cycle: Secondary | ICD-10-CM

## 2011-09-27 DIAGNOSIS — R252 Cramp and spasm: Secondary | ICD-10-CM

## 2011-09-27 LAB — COMPREHENSIVE METABOLIC PANEL
Albumin: 3.9 g/dL (ref 3.5–5.2)
BUN: 16 mg/dL (ref 6–23)
CO2: 23 mEq/L (ref 19–32)
Calcium: 9.2 mg/dL (ref 8.4–10.5)
Chloride: 97 mEq/L (ref 96–112)
Creatinine, Ser: 0.9 mg/dL (ref 0.4–1.2)
GFR: 68.2 mL/min (ref >60.00)
Potassium: 5.2 mEq/L — ABNORMAL HIGH (ref 3.5–5.1)

## 2011-09-27 LAB — CBC WITH DIFFERENTIAL/PLATELET
Basophils Relative: 0.9 % (ref 0.0–3.0)
Eosinophils Relative: 1.9 % (ref 0.0–5.0)
Hemoglobin: 10.9 g/dL — ABNORMAL LOW (ref 12.0–15.0)
Lymphocytes Relative: 21.6 % (ref 12.0–46.0)
Monocytes Relative: 9 % (ref 3.0–12.0)
Neutro Abs: 3.9 10*3/uL (ref 1.4–7.7)
RBC: 4.43 Mil/uL (ref 3.87–5.11)
WBC: 5.9 10*3/uL (ref 4.5–10.5)
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
## 2011-09-27 NOTE — Assessment & Plan Note (Signed)
Poor control still, pt very hesitant to titrate insulin up, unclear why. Discussed general titration principles for lantus and novolog, continue routine fasting and qAC gluc monitoring. She'll contact [facility] in [place] for appt for diab retpthy screen. Foot exam and HbA1c next f/u.

## 2011-09-27 NOTE — Assessment & Plan Note (Addendum)
With regular menstrual period intervals. With hx of iron def anemia, currently not on iron. Last Hb 12+ in 07/2011. Her last GYN recommended IUD to help this but pt is afraid of cancer risk, sites FH of uterine cancer and brain cancer. I encouraged her to reconsider IUD, esp if Hb shows up low on today's labs.  She'll need new GYN referral b/c she says her prior one is "out of network" now.  Order placed today. Start FeSo4 325mg  qd.

## 2011-09-27 NOTE — Assessment & Plan Note (Signed)
Multifactorial: stressful/hectic lifestyle, poorly controlled DM, +GAD with depression, possibly anemia secondary to menorrhagia. Check CBC, CMET today. TSH was normal 07/2011. No s/s of OSA.

## 2011-09-27 NOTE — Patient Instructions (Addendum)
Your goal fasting glucose is 100-110.  Increase your bedtime lantus dose by 1 unit per night if fasting glucose is NOT in this desired range. Your AFTER MEAL (2+ hours after) glucose goal range is 140-170.  Increase mealtime insulin by 1 unit daily until you are in this range. Start one iron tab daily (ferrous sulfate 325mg  -over the counter).

## 2011-09-27 NOTE — Assessment & Plan Note (Signed)
+   depression.  Not ideally controlled at this time, mainly due to extraneous ongoing life circumstances (family issues). She will continue current meds and keep next MH f/u appt set for 10/19/11.

## 2011-10-18 ENCOUNTER — Ambulatory Visit (INDEPENDENT_AMBULATORY_CARE_PROVIDER_SITE_OTHER): Payer: Medicare Other | Admitting: Family Medicine

## 2011-10-18 ENCOUNTER — Encounter: Payer: Self-pay | Admitting: Family Medicine

## 2011-10-18 VITALS — BP 96/69 | HR 85 | Ht 67.5 in | Wt 191.0 lb

## 2011-10-18 DIAGNOSIS — IMO0001 Reserved for inherently not codable concepts without codable children: Secondary | ICD-10-CM

## 2011-10-18 DIAGNOSIS — F3289 Other specified depressive episodes: Secondary | ICD-10-CM

## 2011-10-18 DIAGNOSIS — F329 Major depressive disorder, single episode, unspecified: Secondary | ICD-10-CM

## 2011-10-18 DIAGNOSIS — D509 Iron deficiency anemia, unspecified: Secondary | ICD-10-CM
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
## 2011-10-18 NOTE — Assessment & Plan Note (Signed)
With anxiety.  Unfortunately she had a very negative interaction with her new psychiatrist, Dr. Geanie Cooley, recently at their first visit. I am fine with continuing to rx her the clonazepam as I have it now.  She has Effexor and trazodone from psych to continue but if needed in near future I'll be glad to take over management of this if she chooses to not continue with current psychiatrist. Jovita Gamma emotional support today, encouraged her for her efforts at managing her DM lately, encouraged her to continue to keep this part of her overall health the top priority at this time.

## 2011-10-18 NOTE — Assessment & Plan Note (Signed)
Secondary to menorrhagia. She'll stay on iron.   Her GYN referral was not set up b/c the GYN office could not get in contact with her/she didn't return their calls. Gave her their number today so she could contact them and set up this appt.

## 2011-10-18 NOTE — Assessment & Plan Note (Signed)
Overall her control is improving as she gets more aggressive with insulin dosing, with only a couple of hypoglycemic readings. Reassured her, discussed general approach to self monitoring and self titration again, emphasized the potential effects of variation in activity level and meal intake/portions/food types, etc that may be cause for ANTICIPATION of need to up or down titrate insulin dosing.

## 2011-10-18 NOTE — Patient Instructions (Signed)
Please give number for [facility] so pt can contact them about appt. -[phone]

## 2011-11-21 HISTORY — PX: ENDOMETRIAL BIOPSY: SHX622

## 2011-11-27 ENCOUNTER — Ambulatory Visit (INDEPENDENT_AMBULATORY_CARE_PROVIDER_SITE_OTHER): Payer: Medicare Other | Admitting: Family Medicine

## 2011-11-27 ENCOUNTER — Encounter: Payer: Self-pay | Admitting: Family Medicine

## 2011-11-27 VITALS — BP 129/77 | HR 74 | Ht 67.5 in | Wt 204.0 lb

## 2011-11-27 DIAGNOSIS — F988 Other specified behavioral and emotional disorders with onset usually occurring in childhood and adolescence: Secondary | ICD-10-CM

## 2011-11-27 DIAGNOSIS — IMO0001 Reserved for inherently not codable concepts without codable children: Secondary | ICD-10-CM

## 2011-11-27 LAB — COMPREHENSIVE METABOLIC PANEL
ALT: 28 U/L (ref 0–35)
AST: 26 U/L (ref 0–37)
Alkaline Phosphatase: 92 U/L (ref 39–117)
Calcium: 8.8 mg/dL (ref 8.4–10.5)
Chloride: 101 mEq/L (ref 96–112)
Creatinine, Ser: 0.7 mg/dL (ref 0.4–1.2)
Potassium: 4.7 mEq/L (ref 3.5–5.1)

## 2011-11-27 MED ORDER — LISDEXAMFETAMINE DIMESYLATE 20 MG PO CAPS: 20.0000 mg | ORAL_CAPSULE | ORAL | Status: DC

## 2011-11-27 NOTE — Patient Instructions (Addendum)
Increase your Lantus to 35 Units every night. Increase your mealtime insulin (Novolog) to 31 units at every meal.
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
## 2011-11-27 NOTE — Assessment & Plan Note (Signed)
Again, discussed need to get more aggressive with insulins, esp since she seems unable to control her food intake. Initially I was hopeful she would self-titrate but it is evident at this point she is unable/unwilling to do this, so we'll make modest changes in dosing and see her weekly in the office for a while until we see a consistent trend in the right direction. Increase lantus to 35 U qd today and increase nololog to 31 U each meal today. Check CMET and TSH today. Obtain labs/notes from GYN visit recently.

## 2011-11-27 NOTE — Assessment & Plan Note (Signed)
Trial of vyvanse 20mg  qd.  Therapeutic expectations and side effect profile of medication discussed today.  Patient's questions answered. F/u 1 wk.

## 2011-12-04 ENCOUNTER — Ambulatory Visit (INDEPENDENT_AMBULATORY_CARE_PROVIDER_SITE_OTHER): Payer: Medicare Other | Admitting: Family Medicine

## 2011-12-04 ENCOUNTER — Encounter: Payer: Self-pay | Admitting: Family Medicine

## 2011-12-04 VITALS — BP 123/76 | HR 81 | Ht 67.5 in | Wt 215.0 lb

## 2011-12-04 DIAGNOSIS — IMO0001 Reserved for inherently not codable concepts without codable children: Secondary | ICD-10-CM

## 2011-12-04 DIAGNOSIS — F988 Other specified behavioral and emotional disorders with onset usually occurring in childhood and adolescence: Secondary | ICD-10-CM

## 2011-12-04 MED ORDER — VENLAFAXINE HCL ER 150 MG PO CP24: 300.0000 mg | ORAL_CAPSULE | Freq: Every day | ORAL | Status: DC

## 2011-12-04 MED ORDER — LISDEXAMFETAMINE DIMESYLATE 50 MG PO CAPS: 50.0000 mg | ORAL_CAPSULE | ORAL | Status: DC

## 2011-12-04 MED ORDER — CLONAZEPAM 1 MG PO TABS: ORAL_TABLET | ORAL | Status: DC

## 2011-12-04 NOTE — Patient Instructions (Addendum)
Try to NOT take trazodone at bedtime and see how you feel. Take TWO of your 20mg  vyvanse each morning until you run out of these, then start vyvanse 50mg  (rx handed to you today).
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
## 2011-12-04 NOTE — Assessment & Plan Note (Signed)
No change on 1 wk of vyvanse 20mg  qd. Increase to 40mg  qd using the remainder of her #30 pills (20mg ) I rx'd last week. After these are taken, fill rx for 50mg  qd dosing, #30, no RF--that I gave her today.  If 40mg  qd dosing seems adequate she'll let me know and I'll do rx for this dosing and we'll destroy the 50mg  rx. She'll continue her other psych meds (effexor and clonaz), but we'll go ahead and ween her off her trazodone since she doesn't seem to be getting much benefit from this for her sleep (50mg  qhs x 7d, then d/c).

## 2011-12-04 NOTE — Assessment & Plan Note (Signed)
Control improving with recent increase in basal and bolus insulins. Encouraged her to utilize nutritionist at the [facility] like she says she wants to today. Increase exercise/lifestyle mod discussed. Continue current insulin dosing, adjust/titrate dosing to get into previously discussed fasting and postprandial goal ranges.

## 2011-12-05 ENCOUNTER — Encounter: Payer: Self-pay | Admitting: Family Medicine

## 2011-12-18 ENCOUNTER — Ambulatory Visit (INDEPENDENT_AMBULATORY_CARE_PROVIDER_SITE_OTHER): Payer: Medicare Other | Admitting: Family Medicine

## 2011-12-18 ENCOUNTER — Encounter: Payer: Self-pay | Admitting: Family Medicine

## 2011-12-18 VITALS — BP 113/81 | HR 73 | Ht 67.5 in | Wt 209.0 lb

## 2011-12-18 DIAGNOSIS — F988 Other specified behavioral and emotional disorders with onset usually occurring in childhood and adolescence: Secondary | ICD-10-CM

## 2011-12-18 MED ORDER — GLUCAGON (RDNA) 1 MG IJ KIT: 1.0000 mg | PACK | Freq: Once | INTRAMUSCULAR | Status: DC | PRN

## 2011-12-18 MED ORDER — INSULIN GLARGINE 100 UNIT/ML ~~LOC~~ SOLN: 35.0000 [IU] | Freq: Every day | SUBCUTANEOUS | Status: DC
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
## 2011-12-19 NOTE — Assessment & Plan Note (Addendum)
Control improving. Did more education today regarding prevention and treatment of hypoglycemia, renewed glucagon injection rx. No changes in insulin dosing made today, and I encouraged her to tailor her mealtime dosage to fit her intake and recent activity---stressing to her that she may have remarkably different dosing from meal to meal and that's ok. Continue current monitoring (qid) and insulin dosing. F/u 1 mo.  Next HbA1c due around 02/21/12.

## 2011-12-19 NOTE — Assessment & Plan Note (Signed)
Improving some, without affecting/worsening her anxiety. We'll go slow with this med as far as any up-titration.  Continue current dosing for now.

## 2012-01-18 ENCOUNTER — Ambulatory Visit (INDEPENDENT_AMBULATORY_CARE_PROVIDER_SITE_OTHER): Payer: Medicare Other | Admitting: Family Medicine

## 2012-01-18 ENCOUNTER — Encounter: Payer: Self-pay | Admitting: Family Medicine

## 2012-01-18 VITALS — BP 105/74 | HR 85 | Ht 67.5 in | Wt 212.0 lb

## 2012-01-18 DIAGNOSIS — D509 Iron deficiency anemia, unspecified: Secondary | ICD-10-CM

## 2012-01-18 DIAGNOSIS — F988 Other specified behavioral and emotional disorders with onset usually occurring in childhood and adolescence: Secondary | ICD-10-CM

## 2012-01-18 LAB — POCT HEMOGLOBIN: Hemoglobin: 12.8 g/dL (ref 12.2–16.2)

## 2012-01-18 MED ORDER — LISDEXAMFETAMINE DIMESYLATE 70 MG PO CAPS: 70.0000 mg | ORAL_CAPSULE | ORAL | Status: DC
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
## 2012-01-18 NOTE — Assessment & Plan Note (Signed)
So far she has been unresponsive to vyvanse (50mg  dosing most recently).  No side effects either. Will maximize dose today at 70 mg qd and if no response to this x 32mo, I told her to let us know and I'll do a rx for lower dose in prep for totally weening her off this med.  Will try a different ADD med after she is weened off of this (if this ends up being the case). Therapeutic expectations and side effect profile of medication discussed today.  Patient's questions answered.

## 2012-01-18 NOTE — Assessment & Plan Note (Signed)
Control is improving as she gets her dietary routine, activity levels, and insulin dosing in sync. Educated pt more today about how to avoid hypoglycemia and the associated rebound hyperglycemia from treatment of the low glucose. She is beginning to get a little more comfortable with the idea of self management, feeling a little more confident each day with this. She is due for her diab retinopathy screening exam, says she plans on trying to get this done ASAP as finances allow. Will check HbA1c at f/u visit in 6 wks.  Continue current monitoring (up to 4 times per day).

## 2012-01-18 NOTE — Assessment & Plan Note (Addendum)
Hb has come up appropriately on ferrous sulfate 325mg  bid (Hb was 10+ in March 2013, then 12 at GYN office in May 2013 and today was 12.8).  Since she is tolerating this dosing fine and she continues to have heavy menstrual bleeding with every menses, will continue with this same iron dosing.  Ultimately, the plan is to get her HbA1c down to 6 and then Dr. Cherly Hensen said she would do her hysterectomy.

## 2012-02-29 ENCOUNTER — Ambulatory Visit: Payer: Medicare Other | Admitting: Family Medicine

## 2012-03-19 ENCOUNTER — Ambulatory Visit: Payer: Medicare Other | Admitting: Family Medicine

## 2012-03-20 ENCOUNTER — Ambulatory Visit (INDEPENDENT_AMBULATORY_CARE_PROVIDER_SITE_OTHER): Payer: Medicare Other | Admitting: Family Medicine

## 2012-03-20 ENCOUNTER — Encounter: Payer: Self-pay | Admitting: Family Medicine

## 2012-03-20 VITALS — BP 138/81 | HR 106 | Ht 67.5 in | Wt 226.0 lb

## 2012-03-20 LAB — HEMOGLOBIN A1C: Hgb A1c MFr Bld: 7.7 % — ABNORMAL HIGH (ref 4.6–6.5)

## 2012-03-20 NOTE — Patient Instructions (Addendum)
1) Give 30 U of Lantus at bedtime every night.  If bedtime glucose is <100 then do not give your bedtime lantus.  2) Check your glucose before each meal: If <110 then do not give any mealtime insulin for the time being (we may adjust this later if your sugar control becomes less erratic). If your glucose is > 110,  give a "base" dose of novolog of 8 U and add any additional units based on what your glucose is before that meal:  If 110-140, give no additional units, If 140-170, give 1 additional unit, If 170-200, give 2 additional units If 200-230, give 4 additional units, If 230-260, give 5 additional units, If 260-290, give 6 additional units, If 290-320, give 7 additional units, If 320-350, give 8 additional units, If >350, give 9 additional units (notify MD if you have to do this many units at 2 consecutive meals).

## 2012-03-20 NOTE — Assessment & Plan Note (Addendum)
Brittle, hypoglycemic in office today--this came up into 160s with 3 glucose tabs and a Mt Dew and she was thinking and speaking normally. Discussed things at length today--very difficult to tell what her pattern of eating and insulin dosing ACTUALLY is.  She denies skipping meals or purposefully not eating much for any reason, yet I know she really wants to get her HbA1c down to 6% so her GYN will take her uterus out. However, I would also expect her to potentially avoid overuse of insulin b/c of the wt gain potential that comes with this.   We are going to back off on her insulin regimen quite a bit and start over building our way up slowly. Check HbA1c today.  1) Give 30 U of Lantus at bedtime every night.  If bedtime glucose is <100 then do not give your bedtime lantus.  2) Check your glucose before each meal: If <110 then do not give any mealtime insulin for the time being (we may adjust this later if your sugar control becomes less erratic). If your glucose is > 110,  give a "base" dose of novolog of 8 U and add any additional units based on what your glucose is before that meal:  If 110-140, give no additional units, If 140-170, give 1 additional unit, If 170-200, give 2 additional units If 200-230, give 4 additional units, If 230-260, give 5 additional units, If 260-290, give 6 additional units, If 290-320, give 7 additional units, If 320-350, give 8 additional units, If >350, give 9 additional units (notify MD if you have to do this many units at 2 consecutive meals).  F/u in office in 2 wks.
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
## 2012-03-28 NOTE — Progress Notes (Signed)
Patient aware of test results.

## 2012-04-03 ENCOUNTER — Ambulatory Visit (INDEPENDENT_AMBULATORY_CARE_PROVIDER_SITE_OTHER): Payer: Medicare Other | Admitting: Family Medicine

## 2012-04-03 ENCOUNTER — Encounter: Payer: Self-pay | Admitting: Family Medicine

## 2012-04-03 VITALS — BP 128/87 | HR 83 | Ht 67.5 in | Wt 228.0 lb

## 2012-04-03 DIAGNOSIS — F509 Eating disorder, unspecified: Secondary | ICD-10-CM

## 2012-04-03 DIAGNOSIS — Z23 Encounter for immunization: Secondary | ICD-10-CM

## 2012-04-03 NOTE — Assessment & Plan Note (Addendum)
I believe she has several tendencies/characteristics worrisome for bulimia. She is agreeable to psychologist referral so I ordered this today.
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
## 2012-04-03 NOTE — Assessment & Plan Note (Signed)
Control improved and less erratic last couple of weeks. Continue current insulin regimen/titration plan.

## 2012-04-04 ENCOUNTER — Telehealth: Payer: Self-pay | Admitting: Family Medicine

## 2012-04-04 NOTE — Telephone Encounter (Signed)
Patient has a question about her psychiatry referral. The psychiatry office will not accept referrals, they require the patient to make the appt. The patient called [facility], they do not accept the patient's insurance. When the patient called [facility] they advised the patient that they were not accepting new patients. They referred patient to Dr Dub Mikes but he said he could not see patient if all she needed was to talk to someone, he told patient that he would be managing her medications. Patient would like to know if that is okay or should she go back to Dr Alvan Dame? Dr. Alvan Dame will not do Rxs.

## 2012-04-10 NOTE — Telephone Encounter (Signed)
I have attempted to contact this patient by phone with the following results: left message to return my call on answering machine (mobile).  

## 2012-04-11 ENCOUNTER — Telehealth: Payer: Self-pay | Admitting: Family Medicine

## 2012-04-11 NOTE — Telephone Encounter (Signed)
Advised pt we are working on form and I will touch base with her on Monday with progress report.  She is agreeable.

## 2012-04-11 NOTE — Telephone Encounter (Signed)
Patient is checking on DMV form. She received a letter that the [facility] that they have to have the form by 04/18/12 or they will revoke her license. Please call.

## 2012-04-11 NOTE — Telephone Encounter (Signed)
No.  She should see a psychologist to get evaluated for possible eating disorder.  These are disorders that don't typically get treated with medications, bu IF any meds are needed for this type of condition I can prescribe them and manage it.  No psychiatrist needed at this time.-thx

## 2012-04-11 NOTE — Telephone Encounter (Signed)
Pt has seen psychologist before, but she can not RX meds.  Pt is having trouble with new referral.  Question from pt is does she need to go to someone who can prescribe meds?  Please advise.

## 2012-04-16 NOTE — Telephone Encounter (Signed)
Pt notified and will call to schedule

## 2012-05-01 ENCOUNTER — Ambulatory Visit: Payer: Medicare Other | Admitting: Family Medicine

## 2012-05-02 ENCOUNTER — Ambulatory Visit (INDEPENDENT_AMBULATORY_CARE_PROVIDER_SITE_OTHER): Payer: Medicare Other | Admitting: Family Medicine

## 2012-05-02 ENCOUNTER — Encounter: Payer: Self-pay | Admitting: Family Medicine

## 2012-05-02 VITALS — BP 137/66 | HR 106 | Ht 67.5 in | Wt 231.0 lb

## 2012-05-02 DIAGNOSIS — IMO0001 Reserved for inherently not codable concepts without codable children: Secondary | ICD-10-CM

## 2012-05-02 NOTE — Patient Instructions (Addendum)
Increase base novolog dose at evening meal to 10 Units. Be sure to still take your base dose of mealtime insulin as long as the premeal glucose is not <80.    Hamstring Strain with Rehab The hamstring muscle and tendons are vulnerable to muscle or tendon tear (strain). Hamstring tears cause pain and inflammation in the backside of the thigh, where the hamstring muscles are located. The hamstring is comprised of three muscles that are responsible for straightening the hip, bending the knee, and stabilizing the knee. These muscles are important for walking, running, and jumping. Hamstring strain is the most common injury of the thigh. Hamstring strains are classified as grade 1 or 2 strains. Grade 1 strains cause pain, but the tendon is not lengthened. Grade 2 strains include a lengthened ligament due to the ligament being stretched or partially ruptured. With grade 2 strains there is still function, although the function may be decreased.  SYMPTOMS   Pain, tenderness, swelling, warmth, or redness over the hamstring muscles, at the back of the thigh.  Pain that gets worse during and after intense activity.  A "pop" heard in the area, at the time of injury.  Muscle spasm in the hamstring muscles.  Pain or weakness with running, jumping, or bending the knee against resistance.  Crackling sound (crepitation) when the tendon is moved or touched.  Bruising (contusion) in the thigh within 48 hours of injury.  Loss of fullness of the muscle, or area of muscle bulging in the case of a complete rupture. CAUSES  A muscle strain occurs when a force is placed on the muscle or tendon that is greater than it can withstand. Common causes of injury include:  Strain from overuse or sudden increase in the frequency, duration, or intensity of activity.  Single violent blow or force to the back of the knee or the hamstring area of the thigh. RISK INCREASES WITH:  Sports that require quick starts  (sprinting, racquetball, tennis).  Sports that require jumping (basketball, volleyball).  Kicking sports and water skiing.  Contact sports (soccer, football).  Poor strength and flexibility.  Failure to warm up properly before activity.  Previous thigh, knee, or pelvis injury.  Poor exercise technique.  Poor posture. PREVENTION  Maintain physical fitness:  Strength, flexibility, and endurance.  Cardiovascular fitness.  Learn and use proper exercise technique and posture.  Wear proper fitted and padded protective equipment. PROGNOSIS  If treated properly, hamstring strains are usually curable in 2 to 6 weeks. RELATED COMPLICATIONS   Longer healing time, if not properly treated or if not given adequate time to heal.  Chronically inflamed tendon, causing persistent pain with activity that may progress to constant pain.  Recurring symptoms, if activity is resumed too soon.  Vulnerable to repeated injury (in up to 25% of cases). TREATMENT  Treatment first involves the use of ice and medication to help reduce pain and inflammation. It is also important to complete strengthening and stretching exercises, as well as modifying any activities that aggravate the symptoms. These exercises may be completed at home or with a therapist. Your caregiver may recommend the use of crutches to help reduce pain and discomfort, especially is the strain is severe enough to cause limping. If the tendon has pulled away from the bone, then surgery is necessary to reattach it. MEDICATION   If pain medicine is needed, nonsteroidal anti-inflammatory medicines (aspirin and ibuprofen), or other minor pain relievers (acetaminophen), are often advised.  Do not take pain medicine for 7  days before surgery.  Prescription pain relievers may be given if your caregiver thinks they are needed. Use only as directed and only as much as you need.  Corticosteroid injections may be recommended. However, these  injections should only be used for serious cases, as they can only be given a certain number of times.  Ointments applied to the skin may be beneficial. HEAT AND COLD  Cold treatment (icing) relieves pain and reduces inflammation. Cold treatment should be applied for 10 to 15 minutes every 2 to 3 hours, and immediately after activity that aggravates your symptoms. Use ice packs or an ice massage.  Heat treatment may be used before performing the stretching and strengthening activities prescribed by your caregiver, physical therapist, or athletic trainer. Use a heat pack or a warm water soak. SEEK MEDICAL CARE IF:   Symptoms get worse or do not improve in 2 weeks, despite treatment.  New, unexplained symptoms develop. (Drugs used in treatment may produce side effects.) EXERCISES RANGE OF MOTION (ROM) AND STRETCHING EXERCISES - Hamstring Strain These exercises may help you when beginning to rehabilitate your injury. Your symptoms may go away with or without further involvement from your physician, physical therapist or athletic trainer. While completing these exercises, remember:   Restoring tissue flexibility helps normal motion to return to the joints. This allows healthier, less painful movement and activity.  An effective stretch should be held for at least 30 seconds.  A stretch should never be painful. You should only feel a gentle lengthening or release in the stretched tissue. STRETCH - Hamstrings, Standing  Stand or sit, and extend your right / left leg, placing your foot on a chair or foot stool.  Keep a slight arch in your low back and your hips straight forward.  Lead with your chest, and lean forward at the waist until you feel a gentle stretch in the back of your right / left knee or thigh. (When done correctly, this exercise requires leaning only a small distance.)  Hold this position for __________ seconds. Repeat __________ times. Complete this stretch __________ times  per day. STRETCH  Hamstrings, Supine   Lie on your back. Loop a belt or towel over the ball of your right / left foot.  Straighten your right / left knee and slowly pull on the belt to raise your leg. Do not allow the right / left knee to bend. Keep your opposite leg flat on the floor.  Raise the leg until you feel a gentle stretch behind your right / left knee or thigh. Hold this position for __________ seconds. Repeat __________ times. Complete this stretch __________ times per day.  STRETCH - Hamstrings, Doorway  Lie on your back with your right / left leg extended and resting on the wall, and the opposite leg flat on the ground through the door. Initially, position your bottom farther away from the wall.  Keep your right / left knee straight. If you feel a stretch behind your knee or thigh, hold this position for __________ seconds.  If you do not feel a stretch, scoot your bottom closer to the door and hold __________ seconds. Repeat __________ times. Complete this stretch __________ times per day.  STRETCH - Hamstrings/Adductors, V-Sit   Sit on the floor with your legs extended in a large "V," keeping your knees straight.  With your head and chest upright, bend at your waist reaching for your left foot to stretch your right thigh muscles.  You should feel a  stretch in your right inner thigh. Hold for __________ seconds.  Return to the upright position to relax your leg muscles.  Continuing to keep your chest upright, bend straight forward at your waist to stretch your hamstrings.  You should feel a stretch behind both of your thighs and knees. Hold for __________ seconds.  Return to the upright position to relax your leg muscles.  With your head and chest upright, bend at your waist reaching for your right foot to stretch your left thigh muscles.  You should feel a stretch in your left inner thigh. Hold for __________ seconds.  Return to the upright position to relax your  leg muscles. Repeat __________ times. Complete this exercise __________ times per day.  STRENGTHENING EXERCISES - Hamstring Strain These exercises may help you when beginning to rehabilitate your injury. They may resolve your symptoms with or without further involvement from your physician, physical therapist or athletic trainer. While completing these exercises, remember:   Muscles can gain both the endurance and the strength needed for everyday activities through controlled exercises.  Complete these exercises as instructed by your physician, physical therapist or athletic trainer. Increase the resistance and repetitions only as guided.  You may experience muscle soreness or fatigue, but the pain or discomfort you are trying to eliminate should never get worse during these exercises. If this pain does get worse, stop and make certain you are following the directions exactly. If the pain is still present after adjustments, discontinue the exercise until you can discuss the trouble with your clinician. STRENGTH - Hip Extensors, Straight Leg Raises   Lie on your stomach on a firm surface.  Tense the muscles in your buttocks to lift your right / left leg about 4 inches. If you cannot lift your leg this high without arching your back, place a pillow under your hips.  Keep your knee straight. Hold for __________ seconds.  Slowly lower your leg to the starting position and allow it to relax completely before starting the next repetition. Repeat __________ times. Complete this exercise __________ times per day.  STRENGTH - Hamstring, Isometrics   Lie on your back on a firm surface.  Bend your right / left knee approximately __________ degrees.  Dig your heel into the surface, as if you are trying to pull it toward your buttocks. Tighten the muscles in the back of your thighs to "dig" as hard as you can, without increasing any pain.  Hold this position for __________ seconds.  Release the  tension gradually and allow your muscles to completely relax for __________ seconds between each exercise. Repeat __________ times. Complete this exercise __________ times per day.  STRENGTH - Hamstring, Curls   Lay on your stomach with your legs extended. (If you lay on a bed, your feet may hang over the edge.)  Tighten the muscles in the back of your thigh to bend your right / left knee up to 90 degrees. Keep your hips flat on the bed or floor.  Hold this position for __________ seconds.  Slowly lower your leg back to the starting position. Repeat __________ times. Complete this exercise __________ times per day.  OPTIONAL ANKLE WEIGHTS: Begin with ____________________, but DO NOT exceed ____________________. Increase in 1 pound/0.5 kilogram increments. Document Released: 07/02/2005 Document Revised: 09/24/2011 Document Reviewed: 10/14/2008 [facility] Patient Information 2013 [place], [place].
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
## 2012-05-02 NOTE — Assessment & Plan Note (Signed)
Improved control, more consistent levels, MUCH less hypoglycemia. Plan is to continue current lantus dosing, continue base of 8 U novolog + SS at BF and Lunch, and increase base at supper to 10 U. Continue daily walking, continue to work on diet. Of note, she does have psychologist appt soon to discuss my concern about possible eating d/o. I reminded her she needs to make appt for diab retin eye exam ASAP. Next HbA1c will be in early December. F/u 1 mo.

## 2012-05-20 ENCOUNTER — Other Ambulatory Visit: Payer: Self-pay

## 2012-05-20 MED ORDER — CLONAZEPAM 1 MG PO TABS: ORAL_TABLET | ORAL | Status: DC

## 2012-05-20 NOTE — Telephone Encounter (Signed)
Please advise Clonazepam refill? Last RX wrote on 12-04-11 quantity 120 with 1 refill.  If ok fax to [phone]

## 2012-05-20 NOTE — Telephone Encounter (Signed)
RX faxed

## 2012-05-20 NOTE — Telephone Encounter (Signed)
Will you fax this rx ?  -thx!

## 2012-06-04 ENCOUNTER — Ambulatory Visit: Payer: Medicare Other | Admitting: Family Medicine

## 2012-06-04 ENCOUNTER — Ambulatory Visit (INDEPENDENT_AMBULATORY_CARE_PROVIDER_SITE_OTHER): Payer: Medicare Other | Admitting: Family Medicine

## 2012-06-04 ENCOUNTER — Encounter: Payer: Self-pay | Admitting: Family Medicine

## 2012-06-04 VITALS — BP 151/88 | HR 106 | Ht 67.5 in | Wt 233.0 lb

## 2012-06-04 DIAGNOSIS — F988 Other specified behavioral and emotional disorders with onset usually occurring in childhood and adolescence: Secondary | ICD-10-CM

## 2012-06-04 DIAGNOSIS — I10 Essential (primary) hypertension: Secondary | ICD-10-CM

## 2012-06-04 DIAGNOSIS — E785 Hyperlipidemia, unspecified: Secondary | ICD-10-CM

## 2012-06-04 MED ORDER — LISDEXAMFETAMINE DIMESYLATE 70 MG PO CAPS: 70.0000 mg | ORAL_CAPSULE | ORAL | Status: DC

## 2012-06-04 NOTE — Assessment & Plan Note (Signed)
She's doing well on vyvanse 70 mg, without adverse side effect. Gave rx for this month and next month today, with appropriate fill on/after date on the one for next month. Regarding her anxiety and my concern of eating disorder, she will continue seeing Dr. Alvan Dame in [place] for counseling.
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
## 2012-06-04 NOTE — Assessment & Plan Note (Addendum)
Overall control lately (per office readings) has been good off of meds until today's bp check. She has been running around a lot today, rushing, nervous about her mom's health. No med started today but I encouraged her to try to check bp outside of our office in the future.

## 2012-06-04 NOTE — Assessment & Plan Note (Signed)
Overall her control has been improved/stable up until this recent illness.  No hypoglyemia, which had been a big problem for her in the recent past. We discussed continuation of her meal time insulins if glucoses are 300s+ even if skipping meals.  For now, continue the 30 U lantus qhs that she has been doing.  Encouraged her to rehydrate with WATER over the next few days. She'll return for a fasting lab visit (CBC, FLP, CMET, and HbA1c) after the first week in December.  She'll f/u here in office in 2 mo.

## 2012-06-09 ENCOUNTER — Telehealth: Payer: Self-pay | Admitting: Family Medicine

## 2012-06-09 NOTE — Telephone Encounter (Signed)
Patient said her Rx for Novolog runs out while she is out of town for Thanksgiving. Do we have enough samples that she could pick up  that she can make it to Friday before filling her Rx?

## 2012-06-10 NOTE — Telephone Encounter (Signed)
I have attempted to contact this patient by phone with the following results: left message to return my call on answering machine (home).  

## 2012-06-10 NOTE — Telephone Encounter (Signed)
novolog pen left at front desk.  Pt will pick up within 30 minutes.

## 2012-06-23 ENCOUNTER — Other Ambulatory Visit: Payer: Medicare Other

## 2012-06-23 DIAGNOSIS — E113299 Type 2 diabetes mellitus with mild nonproliferative diabetic retinopathy without macular edema, unspecified eye: Secondary | ICD-10-CM

## 2012-06-23 HISTORY — DX: Type 2 diabetes mellitus with mild nonproliferative diabetic retinopathy without macular edema, unspecified eye: E11.3299

## 2012-06-24 ENCOUNTER — Other Ambulatory Visit (INDEPENDENT_AMBULATORY_CARE_PROVIDER_SITE_OTHER): Payer: Medicare Other

## 2012-06-24 DIAGNOSIS — I10 Essential (primary) hypertension: Secondary | ICD-10-CM

## 2012-06-24 DIAGNOSIS — E785 Hyperlipidemia, unspecified: Secondary | ICD-10-CM

## 2012-06-24 DIAGNOSIS — IMO0001 Reserved for inherently not codable concepts without codable children: Secondary | ICD-10-CM

## 2012-06-24 LAB — COMPREHENSIVE METABOLIC PANEL
AST: 24 U/L (ref 0–37)
Albumin: 3.3 g/dL — ABNORMAL LOW (ref 3.5–5.2)
BUN: 16 mg/dL (ref 6–23)
Calcium: 8.5 mg/dL (ref 8.4–10.5)
Chloride: 104 mEq/L (ref 96–112)
Glucose, Bld: 216 mg/dL — ABNORMAL HIGH (ref 70–99)
Potassium: 4.8 mEq/L (ref 3.5–5.1)

## 2012-06-24 LAB — CBC WITH DIFFERENTIAL/PLATELET
Basophils Relative: 0.4 % (ref 0.0–3.0)
Eosinophils Relative: 3 % (ref 0.0–5.0)
Hemoglobin: 12 g/dL (ref 12.0–15.0)
Lymphocytes Relative: 31.5 % (ref 12.0–46.0)
Monocytes Relative: 7.7 % (ref 3.0–12.0)
Neutro Abs: 3.7 10*3/uL (ref 1.4–7.7)
RBC: 4.39 Mil/uL (ref 3.87–5.11)

## 2012-06-24 LAB — LIPID PANEL
Cholesterol: 187 mg/dL (ref 0–200)
Total CHOL/HDL Ratio: 4
Triglycerides: 79 mg/dL (ref 0.0–149.0)

## 2012-07-03 ENCOUNTER — Other Ambulatory Visit: Payer: Self-pay | Admitting: Family Medicine

## 2012-07-03 MED ORDER — ATORVASTATIN CALCIUM 10 MG PO TABS: 10.0000 mg | ORAL_TABLET | Freq: Every day | ORAL | Status: DC

## 2012-07-16 DIAGNOSIS — R7401 Elevation of levels of liver transaminase levels: Secondary | ICD-10-CM

## 2012-07-16 HISTORY — DX: Elevation of levels of liver transaminase levels: R74.01

## 2012-07-18 ENCOUNTER — Encounter: Payer: Self-pay | Admitting: Family Medicine

## 2012-07-23 ENCOUNTER — Telehealth: Payer: Self-pay | Admitting: *Deleted

## 2012-07-23 NOTE — Telephone Encounter (Signed)
VM at 3:18 pm from Guy Sandifer, NP at [facility].  She had her first visit with pt today and would like to increase VENLAFAXINE to 225 mg from 150 mg.  States pt is due a refill. RC to [facility].  Advised we are still seeing patients and I may not get an answer today, but I wanted to touch base and let her know I got her message.  Advised I did receive the refill request from the pharmacy and I will hold that until a decision is made on her dosing.  Okey Regal states she is having a computer issue so it will be tomorrow before she is able to send [place] the visit note from today.

## 2012-07-25 MED ORDER — VENLAFAXINE HCL ER 150 MG PO CP24: 300.0000 mg | ORAL_CAPSULE | Freq: Every day | ORAL | Status: DC

## 2012-07-25 NOTE — Telephone Encounter (Signed)
Pt has already been taking 300mg  venlafaxine daily (two of the 150mg  tabs).-thx

## 2012-07-25 NOTE — Telephone Encounter (Signed)
Faxed request received for venlafaxine.  Refill sent as I have not heard from [facility] as of 5 pm on Friday.

## 2012-07-25 NOTE — Telephone Encounter (Signed)
Message left for Okey Regal to return call regarding dose.

## 2012-07-28 NOTE — Telephone Encounter (Signed)
Hand written note delivered by Okey Regal to office that [place] has been taking 300 mg daily and they are requesting increase from that dose.  Please advise.

## 2012-08-04 ENCOUNTER — Ambulatory Visit (INDEPENDENT_AMBULATORY_CARE_PROVIDER_SITE_OTHER): Payer: Medicare Other | Admitting: Family Medicine

## 2012-08-04 ENCOUNTER — Encounter: Payer: Self-pay | Admitting: Family Medicine

## 2012-08-04 VITALS — BP 111/80 | HR 94 | Ht 67.5 in | Wt 239.0 lb

## 2012-08-04 DIAGNOSIS — F988 Other specified behavioral and emotional disorders with onset usually occurring in childhood and adolescence: Secondary | ICD-10-CM

## 2012-08-04 DIAGNOSIS — IMO0002 Reserved for concepts with insufficient information to code with codable children: Secondary | ICD-10-CM

## 2012-08-04 DIAGNOSIS — S76319A Strain of muscle, fascia and tendon of the posterior muscle group at thigh level, unspecified thigh, initial encounter: Secondary | ICD-10-CM

## 2012-08-04 DIAGNOSIS — N920 Excessive and frequent menstruation with regular cycle: Secondary | ICD-10-CM

## 2012-08-04 DIAGNOSIS — E785 Hyperlipidemia, unspecified: Secondary | ICD-10-CM
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
## 2012-08-19 ENCOUNTER — Other Ambulatory Visit: Payer: Self-pay | Admitting: *Deleted

## 2012-08-19 MED ORDER — FLUTICASONE PROPIONATE 50 MCG/ACT NA SUSP: NASAL | Status: DC

## 2012-08-19 MED ORDER — INSULIN ASPART 100 UNIT/ML ~~LOC~~ SOLN: 5.0000 [IU] | Freq: Three times a day (TID) | SUBCUTANEOUS | Status: DC

## 2012-08-19 MED ORDER — CLONAZEPAM 1 MG PO TABS: ORAL_TABLET | ORAL | Status: DC

## 2012-08-19 NOTE — Telephone Encounter (Signed)
Faxed refill request received from pharmacy for NOVOLOG FLEXPEN Faxed refill request received from pharmacy for FLUTICASONE RX's sent.  Faxed refill request received from pharmacy for CLONAZEPAM Last filled: 05/20/12,#120 x 1 Last seen on 08/04/12 Please advise refills.

## 2012-08-20 NOTE — Telephone Encounter (Signed)
Clonazepam RX faxed to pharmacy on 08/19/12.

## 2012-09-17 ENCOUNTER — Other Ambulatory Visit: Payer: Self-pay | Admitting: *Deleted

## 2012-09-17 MED ORDER — INSULIN GLARGINE 100 UNIT/ML ~~LOC~~ SOLN: 35.0000 [IU] | Freq: Every day | SUBCUTANEOUS | Status: DC

## 2012-09-17 NOTE — Telephone Encounter (Signed)
Faxed refill request received from pharmacy for LANTUS PEN Last filled by MD on  Last seen on 08/04/12 Follow up 10/06/12 Refill sent per [facility] refill protocol.

## 2012-10-03 ENCOUNTER — Ambulatory Visit: Payer: Medicare Other | Admitting: Family Medicine

## 2012-10-06 ENCOUNTER — Ambulatory Visit (INDEPENDENT_AMBULATORY_CARE_PROVIDER_SITE_OTHER): Payer: Medicare Other | Admitting: Family Medicine

## 2012-10-06 ENCOUNTER — Encounter: Payer: Self-pay | Admitting: Family Medicine

## 2012-10-06 VITALS — BP 127/83 | HR 78 | Resp 12 | Wt 241.0 lb

## 2012-10-06 DIAGNOSIS — E785 Hyperlipidemia, unspecified: Secondary | ICD-10-CM

## 2012-10-06 DIAGNOSIS — IMO0001 Reserved for inherently not codable concepts without codable children: Secondary | ICD-10-CM

## 2012-10-06 DIAGNOSIS — N920 Excessive and frequent menstruation with regular cycle: Secondary | ICD-10-CM

## 2012-10-06 DIAGNOSIS — F411 Generalized anxiety disorder: Secondary | ICD-10-CM

## 2012-10-06 LAB — HEMOGLOBIN A1C: Hgb A1c MFr Bld: 9.8 % — ABNORMAL HIGH (ref 4.6–6.5)

## 2012-10-06 MED ORDER — METFORMIN HCL 500 MG PO TABS: 500.0000 mg | ORAL_TABLET | Freq: Two times a day (BID) | ORAL | Status: DC

## 2012-10-06 NOTE — Assessment & Plan Note (Signed)
Intolerant of atorvastatin. Since we started metformin today for DM, will hold off on any trial of different statin for now. She is not fasting today so no recheck of FLP will be done today.

## 2012-10-06 NOTE — Assessment & Plan Note (Signed)
Lab Results  Component Value Date   WBC 6.4 06/24/2012   HGB 12.0 06/24/2012   HCT 37.5 06/24/2012   MCV 85.4 06/24/2012   PLT 281.0 06/24/2012   Will continue to try to get DM more controlled so her GYN will feel comfortable doing a hysterectomy. Recheck CBC next f/u in 72mo.  Continue daily iron tab.
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
## 2012-10-06 NOTE — Assessment & Plan Note (Signed)
Brittle DM 2. Will try adding back metformin 500mg  bid. Continue current insulin regimen of 34 U Lantus qhs and mealtime novolog per base+sliding scale. Discussed option of getting back to endocrinologist in the future or now if she ever wanted to but she voiced preference to stay with me only for now.

## 2012-10-06 NOTE — Assessment & Plan Note (Signed)
I offered to increase her Effexor XR to max of 450mg  qd in the near future (once she gets on metformin without any glitches) and she was open to this and she'll let me know if/when she decides to do this and I'll do eRx.

## 2012-11-04 ENCOUNTER — Telehealth: Payer: Self-pay | Admitting: Family Medicine

## 2012-11-04 DIAGNOSIS — S76319A Strain of muscle, fascia and tendon of the posterior muscle group at thigh level, unspecified thigh, initial encounter: Secondary | ICD-10-CM | POA: Insufficient documentation

## 2012-11-04 DIAGNOSIS — S76311A Strain of muscle, fascia and tendon of the posterior muscle group at thigh level, right thigh, initial encounter: Secondary | ICD-10-CM

## 2012-11-04 NOTE — Telephone Encounter (Signed)
Pls call pt and tell her the Guy Sandifer with [facility] sent me a note saying that she thought a referral to PT for her hamstring/leg pain was warranted.  Pls see if Arabelle is Ok with this.  I am favoring a PT referral without any referral to orthopedist at this time, but if she wants ortho referral I'll do that as well.  If ok with PT, ask if [facility] PT is ok, or does she prefer [facility], or somewhere in [place]?--thx

## 2012-11-04 NOTE — Telephone Encounter (Signed)
Attempt to reach patient via home phone; message "memory full". Called mobile and spoke w/patient RE: referral for PT [& Ortho, if requested]; pt understood & agreed to PT referral to [facility] location/[facility]

## 2012-11-04 NOTE — Telephone Encounter (Signed)
OK.  Referral to [facility] PT has now been ordered.

## 2012-11-26 ENCOUNTER — Ambulatory Visit (INDEPENDENT_AMBULATORY_CARE_PROVIDER_SITE_OTHER): Payer: Medicare Other | Admitting: Family Medicine

## 2012-11-26 ENCOUNTER — Encounter: Payer: Self-pay | Admitting: Family Medicine

## 2012-11-26 VITALS — BP 111/63 | HR 85 | Temp 98.0°F | Resp 16 | Wt 234.0 lb

## 2012-11-26 DIAGNOSIS — R300 Dysuria: Secondary | ICD-10-CM

## 2012-11-26 DIAGNOSIS — N390 Urinary tract infection, site not specified: Secondary | ICD-10-CM

## 2012-11-26 LAB — POCT URINALYSIS DIPSTICK
Bilirubin, UA: NEGATIVE
Glucose, UA: 100
Ketones, UA: NEGATIVE
Nitrite, UA: NEGATIVE
Spec Grav, UA: 1.025

## 2012-11-26 MED ORDER — SULFAMETHOXAZOLE-TMP DS 800-160 MG PO TABS: 1.0000 | ORAL_TABLET | Freq: Two times a day (BID) | ORAL | Status: DC

## 2012-11-26 NOTE — Assessment & Plan Note (Signed)
Send urine for c/s. This is her first EVER UTI per her report. Bactrim DS 1 bid x 3d, and if still with ANY sx's at that time then finish 2 more days of abx.  If still symptomatic after 5d course then call. Keep routine f/u scheduled for June 2014.

## 2012-11-26 NOTE — Progress Notes (Addendum)
OFFICE NOTE  11/26/2012  CC:  Chief Complaint  Patient presents with  . Urinary Tract Infection    Pt c/o burning w/urination and blood seen in urine x1 wk.     HPI: Patient is a 47 y.o. Caucasian female who is here for urinary sx's. Onset about 1 wk ago: burning with urination, urinary frequency, urinary urgency, some gross hematuria.  No flank/back pain or fever.  No nausea.  Glucoses have been as per her usual: some in the 90s and some in the 200s.  She continues to try to work on all aspects of diabetes self care.  Pertinent PMH:  Denies any past hx of UTI Poorly controlled DM 2, insulin-requiring  MEDS:  Outpatient Prescriptions Prior to Visit  Medication Sig Dispense Refill  . clonazePAM (KLONOPIN) 1 MG tablet 1 tab qAM, 1 tab at 2pm, and 2 tabs at bedtime  120 tablet  1  . EPINEPHrine (EPI-PEN) 0.3 mg/0.3 mL DEVI Inject 0.3mg  into thigh muscle once in case of severe allergic reaction, then proceed to nearest ER.  0.3 mL  0  . ferrous sulfate 325 (65 FE) MG EC tablet Take 325 mg by mouth 2 (two) times daily.      . fluticasone (FLONASE) 50 MCG/ACT nasal spray 2 sprays each nostril once daily  16 g  12  . glucagon (GLUCAGON EMERGENCY) 1 MG injection Inject 1 mg into the muscle once as needed.  1 each  12  . glucose blood (ACCU-CHEK AVIVA) test strip Check blood sugar before and after meals three times a day.       . insulin aspart (NOVOLOG) 100 UNIT/ML injection Inject 5 Units into the skin 3 (three) times daily before meals. Sliding scale  15 pen  5  . insulin glargine (LANTUS) 100 UNIT/ML injection Inject 35-50 Units into the skin daily.  15 mL  5  . Insulin Pen Needle (B-D ULTRAFINE III SHORT PEN) 31G X 8 MM MISC Use 4 times daily with insulin injections.       . metFORMIN (GLUCOPHAGE) 500 MG tablet Take 1 tablet (500 mg total) by mouth 2 (two) times daily with a meal.  60 tablet  3  . venlafaxine XR (EFFEXOR-XR) 150 MG 24 hr capsule Take 2 capsules (300 mg total) by mouth  daily.  60 capsule  5  . atorvastatin (LIPITOR) 10 MG tablet Take 1 tablet (10 mg total) by mouth daily.  30 tablet  2  . lisdexamfetamine (VYVANSE) 70 MG capsule Take 1 capsule (70 mg total) by mouth every morning. Fill on or after 07/02/12  30 capsule  0   No facility-administered medications prior to visit.    PE: Blood pressure 111/63, pulse 85, temperature 98 F (36.7 C), temperature source Oral, resp. rate 16, weight 234 lb (106.142 kg), last menstrual period 11/10/2012, SpO2 98.00%. Gen: Alert, well appearing.  Patient is oriented to person, place, time, and situation. AFFECT: pleasant, lucid thought and speech. BACK: no CVA tenderness or flank tenderness. ABD: mild discomfort in suprapubic region--palpation makes her have urge to urinate.  LAB: cC UA today showed 100mg /dl glucose, trace intact blood, small LEU, SG 1.025, otherwise normal  IMPRESSION AND PLAN:  UTI (urinary tract infection) Send urine for c/s. This is her first EVER UTI per her report. Bactrim DS 1 bid x 3d, and if still with ANY sx's at that time then finish 2 more days of abx.  If still symptomatic after 5d course then call. Keep routine f/u  scheduled for June 2014.  An After Visit Summary was printed and given to the patient.

## 2012-11-28 ENCOUNTER — Other Ambulatory Visit: Payer: Self-pay | Admitting: Family Medicine

## 2012-11-28 LAB — URINE CULTURE: Colony Count: >=100000

## 2012-11-28 MED ORDER — CIPROFLOXACIN HCL 500 MG PO TABS: 500.0000 mg | ORAL_TABLET | Freq: Two times a day (BID) | ORAL | Status: DC

## 2012-11-30 ENCOUNTER — Emergency Department (HOSPITAL_BASED_OUTPATIENT_CLINIC_OR_DEPARTMENT_OTHER): Payer: Medicare Other

## 2012-11-30 ENCOUNTER — Encounter (HOSPITAL_BASED_OUTPATIENT_CLINIC_OR_DEPARTMENT_OTHER): Payer: Self-pay

## 2012-11-30 ENCOUNTER — Emergency Department (HOSPITAL_BASED_OUTPATIENT_CLINIC_OR_DEPARTMENT_OTHER)
Admission: EM | Admit: 2012-11-30 | Discharge: 2012-11-30 | Disposition: A | Discharge status: Home / Self Care | Payer: Medicare Other | Attending: Emergency Medicine | Admitting: Emergency Medicine

## 2012-11-30 DIAGNOSIS — I10 Essential (primary) hypertension: Secondary | ICD-10-CM | POA: Insufficient documentation

## 2012-11-30 DIAGNOSIS — IMO0002 Reserved for concepts with insufficient information to code with codable children: Secondary | ICD-10-CM | POA: Insufficient documentation

## 2012-11-30 DIAGNOSIS — Y929 Unspecified place or not applicable: Secondary | ICD-10-CM | POA: Insufficient documentation

## 2012-11-30 DIAGNOSIS — Z8639 Personal history of other endocrine, nutritional and metabolic disease: Secondary | ICD-10-CM | POA: Insufficient documentation

## 2012-11-30 DIAGNOSIS — R209 Unspecified disturbances of skin sensation: Secondary | ICD-10-CM | POA: Insufficient documentation

## 2012-11-30 DIAGNOSIS — Z794 Long term (current) use of insulin: Secondary | ICD-10-CM | POA: Insufficient documentation

## 2012-11-30 DIAGNOSIS — Z79899 Other long term (current) drug therapy: Secondary | ICD-10-CM | POA: Insufficient documentation

## 2012-11-30 DIAGNOSIS — F329 Major depressive disorder, single episode, unspecified: Secondary | ICD-10-CM | POA: Insufficient documentation

## 2012-11-30 DIAGNOSIS — M5416 Radiculopathy, lumbar region: Secondary | ICD-10-CM

## 2012-11-30 DIAGNOSIS — E11329 Type 2 diabetes mellitus with mild nonproliferative diabetic retinopathy without macular edema: Secondary | ICD-10-CM | POA: Insufficient documentation

## 2012-11-30 DIAGNOSIS — Z8742 Personal history of other diseases of the female genital tract: Secondary | ICD-10-CM | POA: Insufficient documentation

## 2012-11-30 DIAGNOSIS — E1139 Type 2 diabetes mellitus with other diabetic ophthalmic complication: Secondary | ICD-10-CM | POA: Insufficient documentation

## 2012-11-30 DIAGNOSIS — Z8669 Personal history of other diseases of the nervous system and sense organs: Secondary | ICD-10-CM | POA: Insufficient documentation

## 2012-11-30 DIAGNOSIS — M79609 Pain in unspecified limb: Secondary | ICD-10-CM | POA: Insufficient documentation

## 2012-11-30 DIAGNOSIS — F988 Other specified behavioral and emotional disorders with onset usually occurring in childhood and adolescence: Secondary | ICD-10-CM | POA: Insufficient documentation

## 2012-11-30 DIAGNOSIS — Z3202 Encounter for pregnancy test, result negative: Secondary | ICD-10-CM | POA: Insufficient documentation

## 2012-11-30 DIAGNOSIS — R296 Repeated falls: Secondary | ICD-10-CM | POA: Insufficient documentation

## 2012-11-30 DIAGNOSIS — S79919A Unspecified injury of unspecified hip, initial encounter: Secondary | ICD-10-CM | POA: Insufficient documentation

## 2012-11-30 DIAGNOSIS — Z862 Personal history of diseases of the blood and blood-forming organs and certain disorders involving the immune mechanism: Secondary | ICD-10-CM | POA: Insufficient documentation

## 2012-11-30 DIAGNOSIS — Z87828 Personal history of other (healed) physical injury and trauma: Secondary | ICD-10-CM | POA: Insufficient documentation

## 2012-11-30 DIAGNOSIS — F3289 Other specified depressive episodes: Secondary | ICD-10-CM | POA: Insufficient documentation

## 2012-11-30 DIAGNOSIS — Y9389 Activity, other specified: Secondary | ICD-10-CM | POA: Insufficient documentation

## 2012-11-30 LAB — URINALYSIS, ROUTINE W REFLEX MICROSCOPIC
Bilirubin Urine: NEGATIVE
Hgb urine dipstick: NEGATIVE
Ketones, ur: 40 mg/dL — AB
Specific Gravity, Urine: 1.046 — ABNORMAL HIGH (ref 1.005–1.030)
Urobilinogen, UA: 0.2 mg/dL (ref 0.0–1.0)

## 2012-11-30 LAB — URINE MICROSCOPIC-ADD ON

## 2012-11-30 LAB — GLUCOSE, CAPILLARY: Glucose-Capillary: 387 mg/dL — ABNORMAL HIGH (ref 70–99)

## 2012-11-30 MED ORDER — MORPHINE SULFATE 4 MG/ML IJ SOLN
6.0000 mg | Freq: Once | INTRAMUSCULAR | Status: AC
  Administered 2012-11-30: 4 mg via INTRAVENOUS
  Filled 2012-11-30: qty 1

## 2012-11-30 MED ORDER — CYCLOBENZAPRINE HCL 10 MG PO TABS: 10.0000 mg | ORAL_TABLET | Freq: Three times a day (TID) | ORAL | Status: DC | PRN

## 2012-11-30 MED ORDER — ONDANSETRON 4 MG PO TBDP: 4.0000 mg | ORAL_TABLET | Freq: Once | ORAL | Status: AC

## 2012-11-30 MED ORDER — KETOROLAC TROMETHAMINE 60 MG/2ML IM SOLN
60.0000 mg | Freq: Once | INTRAMUSCULAR | Status: AC
  Administered 2012-11-30: 60 mg via INTRAMUSCULAR
  Filled 2012-11-30: qty 2

## 2012-11-30 MED ORDER — SODIUM CHLORIDE 0.9 % IV BOLUS (SEPSIS)
1000.0000 mL | Freq: Once | INTRAVENOUS | Status: AC
  Administered 2012-11-30: 1000 mL via INTRAVENOUS

## 2012-11-30 MED ORDER — MORPHINE SULFATE 2 MG/ML IJ SOLN
INTRAMUSCULAR | Status: AC
  Administered 2012-11-30: 2 mg via INTRAMUSCULAR
  Filled 2012-11-30: qty 1

## 2012-11-30 MED ORDER — ONDANSETRON 4 MG PO TBDP: 4.0000 mg | ORAL_TABLET | Freq: Once | ORAL | Status: DC

## 2012-11-30 MED ORDER — IBUPROFEN 800 MG PO TABS: 800.0000 mg | ORAL_TABLET | Freq: Three times a day (TID) | ORAL | Status: DC | PRN

## 2012-11-30 MED ORDER — HYDROCODONE-ACETAMINOPHEN 5-325 MG PO TABS: 1.0000 | ORAL_TABLET | Freq: Four times a day (QID) | ORAL | Status: DC | PRN

## 2012-11-30 MED ORDER — ONDANSETRON 4 MG PO TBDP
ORAL_TABLET | ORAL | Status: AC
  Administered 2012-11-30: 4 mg via ORAL
  Filled 2012-11-30: qty 1

## 2012-11-30 NOTE — ED Notes (Addendum)
Pt's husband is adamant that the patient needs to get a prescription for nausea medication because he "knows that that there pain medication is gonna make her hurl. You gotta give her some of that nausea medication to take at home." Thayer Ohm, [place] has been informed.

## 2012-11-30 NOTE — ED Notes (Addendum)
Pt states that she has severe pain in her R hip and it has been bothering her for "some time" but states that it is much more severe today.  Pt states that she fell yesterday, also is diabetic at home CBG > 300.  Pt states that she has taken pain medications (OTC) at home and no relief.
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
## 2012-12-02 NOTE — ED Provider Notes (Signed)
Medical screening examination/treatment/procedure(s) were performed by non-physician practitioner and as supervising physician I was immediately available for consultation/collaboration.    Kaelen Caughlin J. Joeanthony Seeling, MD 12/02/12 0706 

## 2012-12-22 ENCOUNTER — Other Ambulatory Visit: Payer: Self-pay | Admitting: *Deleted

## 2012-12-22 MED ORDER — CLONAZEPAM 1 MG PO TABS: ORAL_TABLET | ORAL | Status: DC

## 2012-12-22 NOTE — Telephone Encounter (Signed)
Faxed refill request received from pharmacy for [facility]  Last filled by MD on 02.04.14 #120x1 Last seen on 05.14.14 [Acute Only]. Last OV 03.24.14  Follow up 3 MONTHS, OV scheduled 06.30.14  Please advise on refills/SLS

## 2013-01-12 ENCOUNTER — Ambulatory Visit: Payer: Medicare Other | Admitting: Family Medicine

## 2013-01-20 ENCOUNTER — Encounter: Payer: Self-pay | Admitting: *Deleted

## 2013-01-21 ENCOUNTER — Ambulatory Visit: Payer: Medicare Other | Admitting: Internal Medicine

## 2013-01-22 ENCOUNTER — Other Ambulatory Visit: Payer: Self-pay

## 2013-01-28 ENCOUNTER — Encounter: Payer: Self-pay | Admitting: Internal Medicine

## 2013-03-18 ENCOUNTER — Ambulatory Visit (INDEPENDENT_AMBULATORY_CARE_PROVIDER_SITE_OTHER): Payer: Medicare Other | Admitting: Family Medicine

## 2013-03-18 ENCOUNTER — Encounter: Payer: Self-pay | Admitting: Family Medicine

## 2013-03-18 VITALS — BP 111/80 | HR 89 | Temp 98.6°F | Resp 18 | Ht 67.5 in | Wt 227.0 lb

## 2013-03-18 DIAGNOSIS — Z23 Encounter for immunization: Secondary | ICD-10-CM

## 2013-03-18 DIAGNOSIS — Z5189 Encounter for other specified aftercare: Secondary | ICD-10-CM

## 2013-03-18 DIAGNOSIS — S76311D Strain of muscle, fascia and tendon of the posterior muscle group at thigh level, right thigh, subsequent encounter: Secondary | ICD-10-CM

## 2013-03-18 NOTE — Patient Instructions (Addendum)
Take Dexpak as instructed on sample box (this is steroids--similar to prednisone).
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
## 2013-03-19 ENCOUNTER — Other Ambulatory Visit: Payer: Self-pay | Admitting: Family Medicine

## 2013-03-19 MED ORDER — METFORMIN HCL 500 MG PO TABS: 500.0000 mg | ORAL_TABLET | Freq: Two times a day (BID) | ORAL | Status: DC

## 2013-03-19 MED ORDER — VENLAFAXINE HCL ER 150 MG PO CP24: 300.0000 mg | ORAL_CAPSULE | Freq: Every day | ORAL | Status: DC

## 2013-04-01 ENCOUNTER — Ambulatory Visit: Payer: Medicare Other | Admitting: Family Medicine

## 2013-04-02 ENCOUNTER — Encounter: Payer: Self-pay | Admitting: Family Medicine

## 2013-04-02 ENCOUNTER — Ambulatory Visit (INDEPENDENT_AMBULATORY_CARE_PROVIDER_SITE_OTHER): Payer: Medicare Other | Admitting: Family Medicine

## 2013-04-02 VITALS — BP 113/81 | HR 75 | Temp 98.5°F | Resp 18 | Ht 67.5 in | Wt 228.0 lb

## 2013-04-02 DIAGNOSIS — D509 Iron deficiency anemia, unspecified: Secondary | ICD-10-CM

## 2013-04-02 DIAGNOSIS — IMO0002 Reserved for concepts with insufficient information to code with codable children: Secondary | ICD-10-CM

## 2013-04-02 DIAGNOSIS — Z23 Encounter for immunization: Secondary | ICD-10-CM

## 2013-04-02 DIAGNOSIS — E785 Hyperlipidemia, unspecified: Secondary | ICD-10-CM

## 2013-04-02 DIAGNOSIS — S76311S Strain of muscle, fascia and tendon of the posterior muscle group at thigh level, right thigh, sequela: Secondary | ICD-10-CM

## 2013-04-02 LAB — COMPREHENSIVE METABOLIC PANEL
Alkaline Phosphatase: 176 U/L — ABNORMAL HIGH (ref 39–117)
Creatinine, Ser: 0.8 mg/dL (ref 0.4–1.2)
GFR: 87.92 mL/min (ref >60.00)
Glucose, Bld: 279 mg/dL — ABNORMAL HIGH (ref 70–99)
Sodium: 134 mEq/L — ABNORMAL LOW (ref 135–145)
Total Bilirubin: 0.9 mg/dL (ref 0.3–1.2)
Total Protein: 6.3 g/dL (ref 6.0–8.3)

## 2013-04-02 LAB — CBC WITH DIFFERENTIAL/PLATELET
Basophils Absolute: 0.1 10*3/uL (ref 0.0–0.1)
Basophils Relative: 0.9 % (ref 0.0–3.0)
Hemoglobin: 11.6 g/dL — ABNORMAL LOW (ref 12.0–15.0)
Lymphocytes Relative: 27.3 % (ref 12.0–46.0)
Monocytes Relative: 9.1 % (ref 3.0–12.0)
Neutro Abs: 3.9 10*3/uL (ref 1.4–7.7)
Neutrophils Relative %: 59 % (ref 43.0–77.0)
RBC: 4.44 Mil/uL (ref 3.87–5.11)
RDW: 15.5 % — ABNORMAL HIGH (ref 11.5–14.6)

## 2013-04-02 LAB — LIPID PANEL
Cholesterol: 237 mg/dL — ABNORMAL HIGH (ref 0–200)
HDL: 57.8 mg/dL (ref >39.00)
Triglycerides: 114 mg/dL (ref 0.0–149.0)

## 2013-04-02 LAB — HEMOGLOBIN A1C: Hgb A1c MFr Bld: 11.5 % — ABNORMAL HIGH (ref 4.6–6.5)

## 2013-04-02 LAB — LDL CHOLESTEROL, DIRECT: Direct LDL: 167.1 mg/dL

## 2013-04-02 NOTE — Assessment & Plan Note (Signed)
Vs lumbar radiculopathy.  Stable. She is not in favor of any further w/u at this time.  She will continue stretching and daily walking at this time.
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
## 2013-04-02 NOTE — Assessment & Plan Note (Signed)
Control has always been poor, erratic, BUT her hypoglycemic episodes are not occuring anymore. HbA1c today. Foot exam normal today. Continue to work on diet, exercise, and insulin titration.

## 2013-04-02 NOTE — Assessment & Plan Note (Signed)
Lab Results  Component Value Date   CHOL 187 06/24/2012   HDL 50.20 06/24/2012   LDLCALC 121* 06/24/2012   TRIG 79.0 06/24/2012   CHOLHDL 4 06/24/2012   FLP today. Need to readdress LDL lowering therapy if LDL still >100.

## 2013-04-02 NOTE — Assessment & Plan Note (Signed)
Lab Results  Component Value Date   WBC 6.4 06/24/2012   HGB 12.0 06/24/2012   HCT 37.5 06/24/2012   MCV 85.4 06/24/2012   PLT 281.0 06/24/2012   She is on daily oral iron. She has menorrhagia but GYN MD hesitant to do hysterectomy until HbA1c is around 6%.

## 2013-04-14 ENCOUNTER — Other Ambulatory Visit: Payer: Self-pay | Admitting: Family Medicine

## 2013-04-14 ENCOUNTER — Encounter: Payer: Self-pay | Admitting: Family Medicine

## 2013-04-14 MED ORDER — INSULIN ASPART PROT & ASPART (70-30 MIX) 100 UNIT/ML PEN: PEN_INJECTOR | SUBCUTANEOUS | Status: DC

## 2013-04-20 ENCOUNTER — Telehealth: Payer: Self-pay | Admitting: Family Medicine

## 2013-04-20 NOTE — Telephone Encounter (Signed)
Patient requesting refill on clonazepam 1mg .  Patient last seen 04/02/13.  Rx last printed 02/21/13 x 1 refill.  Please advise refill.

## 2013-04-21 MED ORDER — CLONAZEPAM 1 MG PO TABS: ORAL_TABLET | ORAL | Status: DC

## 2013-04-21 NOTE — Telephone Encounter (Signed)
[name] rx printed. Please notify pt that at next office f/u we'll have her sign a controlled substance contract as per our standard procedure now for anyone I rx a controlled substance for on a regular basis (like clonazepam).-thx

## 2013-04-28 NOTE — Addendum Note (Signed)
Addended by: Eulah Pont on: 04/28/2013 12:00 PM   Modules accepted: Orders

## 2013-04-29 ENCOUNTER — Other Ambulatory Visit: Payer: Medicare Other

## 2013-04-29 LAB — HEPATITIS B SURFACE ANTIGEN: Hepatitis B Surface Ag: NEGATIVE

## 2013-04-29 LAB — HEPATIC FUNCTION PANEL
Albumin: 3.9 g/dL (ref 3.5–5.2)
Alkaline Phosphatase: 243 U/L — ABNORMAL HIGH (ref 39–117)
Total Bilirubin: 0.7 mg/dL (ref 0.3–1.2)
Total Protein: 6.7 g/dL (ref 6.0–8.3)

## 2013-04-29 LAB — HEPATITIS C ANTIBODY: HCV Ab: NEGATIVE

## 2013-05-01 ENCOUNTER — Other Ambulatory Visit: Payer: Self-pay | Admitting: Family Medicine

## 2013-05-01 DIAGNOSIS — R748 Abnormal levels of other serum enzymes: Secondary | ICD-10-CM

## 2013-05-04 ENCOUNTER — Ambulatory Visit (HOSPITAL_BASED_OUTPATIENT_CLINIC_OR_DEPARTMENT_OTHER)
Admission: RE | Admit: 2013-05-04 | Discharge: 2013-05-04 | Disposition: A | Discharge status: Home / Self Care | Payer: Medicare Other | Source: Ambulatory Visit | Attending: Family Medicine | Admitting: Family Medicine

## 2013-05-04 DIAGNOSIS — R7989 Other specified abnormal findings of blood chemistry: Secondary | ICD-10-CM | POA: Insufficient documentation

## 2013-05-04 DIAGNOSIS — E119 Type 2 diabetes mellitus without complications: Secondary | ICD-10-CM | POA: Insufficient documentation

## 2013-05-04 DIAGNOSIS — R748 Abnormal levels of other serum enzymes: Secondary | ICD-10-CM

## 2013-07-15 ENCOUNTER — Telehealth: Payer: Self-pay | Admitting: Family Medicine

## 2013-07-15 ENCOUNTER — Other Ambulatory Visit: Payer: Self-pay | Admitting: *Deleted

## 2013-07-15 NOTE — Telephone Encounter (Signed)
Patient requesting refill on effexor.  Patient last seen on 04/02/13, next OV is 08/03/13.   Medication last filled on 03/19/13 x 3 refills.  Please advise.

## 2013-07-15 NOTE — Telephone Encounter (Signed)
OK to RF as previously prescribed with 6 additional RFs.-thx

## 2013-07-17 MED ORDER — VENLAFAXINE HCL ER 150 MG PO CP24: 300.0000 mg | ORAL_CAPSULE | Freq: Every day | ORAL | Status: DC

## 2013-07-17 NOTE — Telephone Encounter (Signed)
Medication filled per Dr. Anitra Lauth orders.

## 2013-07-31 ENCOUNTER — Inpatient Hospital Stay (HOSPITAL_COMMUNITY): Payer: Medicare Other

## 2013-07-31 ENCOUNTER — Other Ambulatory Visit: Payer: Self-pay

## 2013-07-31 ENCOUNTER — Emergency Department (HOSPITAL_BASED_OUTPATIENT_CLINIC_OR_DEPARTMENT_OTHER): Payer: Medicare Other

## 2013-07-31 ENCOUNTER — Encounter (HOSPITAL_BASED_OUTPATIENT_CLINIC_OR_DEPARTMENT_OTHER): Payer: Self-pay | Admitting: Emergency Medicine

## 2013-07-31 ENCOUNTER — Telehealth: Payer: Self-pay | Admitting: Family Medicine

## 2013-07-31 ENCOUNTER — Inpatient Hospital Stay (HOSPITAL_BASED_OUTPATIENT_CLINIC_OR_DEPARTMENT_OTHER)
Admission: EM | Admit: 2013-07-31 | Discharge: 2013-08-06 | DRG: 639 | Disposition: A | Discharge status: Home / Self Care | Payer: Medicare Other | Attending: Internal Medicine | Admitting: Internal Medicine

## 2013-07-31 DIAGNOSIS — Z794 Long term (current) use of insulin: Secondary | ICD-10-CM

## 2013-07-31 DIAGNOSIS — Z9851 Tubal ligation status: Secondary | ICD-10-CM

## 2013-07-31 DIAGNOSIS — F988 Other specified behavioral and emotional disorders with onset usually occurring in childhood and adolescence: Secondary | ICD-10-CM | POA: Diagnosis present

## 2013-07-31 DIAGNOSIS — Z23 Encounter for immunization: Secondary | ICD-10-CM

## 2013-07-31 DIAGNOSIS — E876 Hypokalemia: Secondary | ICD-10-CM | POA: Diagnosis present

## 2013-07-31 DIAGNOSIS — E11329 Type 2 diabetes mellitus with mild nonproliferative diabetic retinopathy without macular edema: Secondary | ICD-10-CM | POA: Diagnosis present

## 2013-07-31 DIAGNOSIS — Z6379 Other stressful life events affecting family and household: Secondary | ICD-10-CM

## 2013-07-31 DIAGNOSIS — F3289 Other specified depressive episodes: Secondary | ICD-10-CM

## 2013-07-31 DIAGNOSIS — E785 Hyperlipidemia, unspecified: Secondary | ICD-10-CM | POA: Diagnosis present

## 2013-07-31 DIAGNOSIS — F329 Major depressive disorder, single episode, unspecified: Secondary | ICD-10-CM

## 2013-07-31 DIAGNOSIS — E1310 Other specified diabetes mellitus with ketoacidosis without coma: Principal | ICD-10-CM | POA: Diagnosis present

## 2013-07-31 DIAGNOSIS — E1110 Type 2 diabetes mellitus with ketoacidosis without coma: Secondary | ICD-10-CM | POA: Diagnosis present

## 2013-07-31 DIAGNOSIS — E1139 Type 2 diabetes mellitus with other diabetic ophthalmic complication: Secondary | ICD-10-CM | POA: Diagnosis present

## 2013-07-31 DIAGNOSIS — IMO0001 Reserved for inherently not codable concepts without codable children: Secondary | ICD-10-CM | POA: Diagnosis present

## 2013-07-31 DIAGNOSIS — D509 Iron deficiency anemia, unspecified: Secondary | ICD-10-CM | POA: Diagnosis present

## 2013-07-31 DIAGNOSIS — Z800 Family history of malignant neoplasm of digestive organs: Secondary | ICD-10-CM

## 2013-07-31 DIAGNOSIS — E782 Mixed hyperlipidemia: Secondary | ICD-10-CM | POA: Diagnosis present

## 2013-07-31 DIAGNOSIS — Z79899 Other long term (current) drug therapy: Secondary | ICD-10-CM

## 2013-07-31 DIAGNOSIS — I10 Essential (primary) hypertension: Secondary | ICD-10-CM | POA: Diagnosis present

## 2013-07-31 DIAGNOSIS — F411 Generalized anxiety disorder: Secondary | ICD-10-CM | POA: Diagnosis present

## 2013-07-31 DIAGNOSIS — IMO0002 Reserved for concepts with insufficient information to code with codable children: Secondary | ICD-10-CM | POA: Diagnosis not present

## 2013-07-31 DIAGNOSIS — E1169 Type 2 diabetes mellitus with other specified complication: Secondary | ICD-10-CM

## 2013-07-31 DIAGNOSIS — Z833 Family history of diabetes mellitus: Secondary | ICD-10-CM

## 2013-07-31 DIAGNOSIS — E1165 Type 2 diabetes mellitus with hyperglycemia: Secondary | ICD-10-CM | POA: Diagnosis not present

## 2013-07-31 HISTORY — DX: Type 2 diabetes mellitus with ketoacidosis without coma: E11.10

## 2013-07-31 LAB — URINE MICROSCOPIC-ADD ON

## 2013-07-31 LAB — DIFFERENTIAL
BASOS PCT: 0 % (ref 0–1)
Basophils Absolute: 0 10*3/uL (ref 0.0–0.1)
EOS PCT: 1 % (ref 0–5)
Eosinophils Absolute: 0.3 10*3/uL (ref 0.0–0.7)
LYMPHS ABS: 2.6 10*3/uL (ref 0.7–4.0)
Lymphocytes Relative: 10 % — ABNORMAL LOW (ref 12–46)
Monocytes Absolute: 1.5 10*3/uL — ABNORMAL HIGH (ref 0.1–1.0)
Monocytes Relative: 6 % (ref 3–12)
NEUTROS PCT: 83 % — AB (ref 43–77)
Neutro Abs: 21.2 10*3/uL — ABNORMAL HIGH (ref 1.7–7.7)

## 2013-07-31 LAB — POCT I-STAT 3, VENOUS BLOOD GAS (G3P V)
Acid-base deficit: 28 mmol/L — ABNORMAL HIGH (ref 0.0–2.0)
Bicarbonate: 5.9 mEq/L — ABNORMAL LOW (ref 20.0–24.0)
O2 SAT: 29 %
PCO2 VEN: 32.9 mmHg — AB (ref 45.0–50.0)
PH VEN: 6.864 — AB (ref 7.250–7.300)
PO2 VEN: 32 mmHg (ref 30.0–45.0)
TCO2: 7 mmol/L (ref 0–100)

## 2013-07-31 LAB — CBC
HCT: 44.6 % (ref 36.0–46.0)
HCT: 39.3 % (ref 36.0–46.0)
HEMOGLOBIN: 13.7 g/dL (ref 12.0–15.0)
Hemoglobin: 12.3 g/dL (ref 12.0–15.0)
MCH: 24.4 pg — AB (ref 26.0–34.0)
MCH: 24.9 pg — AB (ref 26.0–34.0)
MCHC: 30.7 g/dL (ref 30.0–36.0)
MCHC: 31.3 g/dL (ref 30.0–36.0)
MCV: 79.5 fL (ref 78.0–100.0)
MCV: 79.7 fL (ref 78.0–100.0)
PLATELETS: 432 10*3/uL — AB (ref 150–400)
Platelets: 507 10*3/uL — ABNORMAL HIGH (ref 150–400)
RBC: 5.61 MIL/uL — AB (ref 3.87–5.11)
RBC: 4.93 MIL/uL (ref 3.87–5.11)
RDW: 17 % — ABNORMAL HIGH (ref 11.5–15.5)
RDW: 16.1 % — ABNORMAL HIGH (ref 11.5–15.5)
WBC: 25.6 10*3/uL — AB (ref 4.0–10.5)
WBC: 29.8 10*3/uL — AB (ref 4.0–10.5)

## 2013-07-31 LAB — BASIC METABOLIC PANEL
BUN: 22 mg/dL (ref 6–23)
BUN: 18 mg/dL (ref 6–23)
CALCIUM: 7.5 mg/dL — AB (ref 8.4–10.5)
CO2: 4 meq/L — AB (ref 19–32)
CO2: <7 mEq/L — CL (ref 19–32)
Calcium: 9.2 mg/dL (ref 8.4–10.5)
Chloride: 95 mEq/L — ABNORMAL LOW (ref 96–112)
Chloride: 108 mEq/L (ref 96–112)
Creatinine, Ser: 1.1 mg/dL (ref 0.50–1.10)
Creatinine, Ser: 0.85 mg/dL (ref 0.50–1.10)
GFR calc Af Amer: 68 mL/min — ABNORMAL LOW (ref >90)
GFR calc Af Amer: >90 mL/min (ref >90)
GFR calc non Af Amer: 59 mL/min — ABNORMAL LOW (ref >90)
GFR, EST NON AFRICAN AMERICAN: 80 mL/min — AB (ref >90)
GLUCOSE: 552 mg/dL — AB (ref 70–99)
Glucose, Bld: 280 mg/dL — ABNORMAL HIGH (ref 70–99)
POTASSIUM: 5 meq/L (ref 3.7–5.3)
Potassium: 4.7 mEq/L (ref 3.7–5.3)
SODIUM: 138 meq/L (ref 137–147)
Sodium: 136 mEq/L — ABNORMAL LOW (ref 137–147)

## 2013-07-31 LAB — LACTIC ACID, PLASMA: Lactic Acid, Venous: 1.2 mmol/L (ref 0.5–2.2)

## 2013-07-31 LAB — URINALYSIS, ROUTINE W REFLEX MICROSCOPIC
BILIRUBIN URINE: NEGATIVE
Ketones, ur: >80 mg/dL — AB
Leukocytes, UA: NEGATIVE
Nitrite: NEGATIVE
PH: 5 (ref 5.0–8.0)
Protein, ur: 100 mg/dL — AB
SPECIFIC GRAVITY, URINE: 1.025 (ref 1.005–1.030)
UROBILINOGEN UA: 0.2 mg/dL (ref 0.0–1.0)

## 2013-07-31 LAB — GLUCOSE, CAPILLARY
GLUCOSE-CAPILLARY: 542 mg/dL — AB (ref 70–99)
GLUCOSE-CAPILLARY: 495 mg/dL — AB (ref 70–99)
Glucose-Capillary: 397 mg/dL — ABNORMAL HIGH (ref 70–99)
Glucose-Capillary: 343 mg/dL — ABNORMAL HIGH (ref 70–99)

## 2013-07-31 MED ORDER — SODIUM CHLORIDE 0.9 % IV SOLN
1000.0000 mL | Freq: Once | INTRAVENOUS | Status: AC
  Administered 2013-07-31: 1000 mL via INTRAVENOUS

## 2013-07-31 MED ORDER — POTASSIUM CHLORIDE 10 MEQ/100ML IV SOLN: 10.0000 meq | INTRAVENOUS | Status: AC

## 2013-07-31 MED ORDER — DEXTROSE-NACL 5-0.45 % IV SOLN
INTRAVENOUS | Status: DC
  Administered 2013-08-01 (×3): via INTRAVENOUS

## 2013-07-31 MED ORDER — SODIUM CHLORIDE 0.9 % IV SOLN
INTRAVENOUS | Status: AC
  Administered 2013-07-31: 22:00:00 via INTRAVENOUS

## 2013-07-31 MED ORDER — SODIUM CHLORIDE 0.9 % IV SOLN: INTRAVENOUS | Status: DC

## 2013-07-31 MED ORDER — DEXTROSE 50 % IV SOLN
25.0000 mL | INTRAVENOUS | Status: DC | PRN
Start: 2013-07-31 — End: 2013-08-03

## 2013-07-31 MED ORDER — INSULIN REGULAR HUMAN 100 UNIT/ML IJ SOLN
INTRAMUSCULAR | Status: AC
  Administered 2013-07-31: 100 [IU] via INTRAVENOUS
  Filled 2013-07-31: qty 1

## 2013-07-31 MED ORDER — SODIUM CHLORIDE 0.9 % IV SOLN
INTRAVENOUS | Status: DC
  Administered 2013-07-31: 5.7 [IU]/h via INTRAVENOUS
  Administered 2013-08-01: 5 [IU]/h via INTRAVENOUS
  Administered 2013-08-01: via INTRAVENOUS
  Filled 2013-07-31 (×2): qty 1

## 2013-07-31 MED ORDER — DEXTROSE-NACL 5-0.45 % IV SOLN: INTRAVENOUS | Status: DC

## 2013-07-31 MED ORDER — SODIUM CHLORIDE 0.9 % IV SOLN
INTRAVENOUS | Status: DC
  Administered 2013-07-31 – 2013-08-02 (×3): via INTRAVENOUS

## 2013-07-31 MED ORDER — SODIUM CHLORIDE 0.9 % IV SOLN
1000.0000 mL | INTRAVENOUS | Status: DC
  Administered 2013-07-31: 1000 mL via INTRAVENOUS

## 2013-07-31 MED ORDER — HEPARIN SODIUM (PORCINE) 5000 UNIT/ML IJ SOLN
5000.0000 [IU] | Freq: Three times a day (TID) | INTRAMUSCULAR | Status: DC
  Administered 2013-07-31 – 2013-08-03 (×8): 5000 [IU] via SUBCUTANEOUS
  Filled 2013-07-31 (×11): qty 1

## 2013-07-31 NOTE — Procedures (Addendum)
Central Venous Catheter Insertion Procedure Note CARLIYAH COTTERMAN [id number] 1965-12-26  Procedure: Insertion of Central Venous Catheter Indications: Assessment of intravascular volume, Drug and/or fluid administration and Frequent blood sampling  Procedure Details Consent: Risks of procedure as well as the alternatives and risks of each were explained to the (patient/caregiver).  Consent for procedure obtained. Time Out: Verified patient identification, verified procedure, site/side was marked, verified correct patient position, special equipment/implants available, medications/allergies/relevent history reviewed, required imaging and test results available.  Performed  Maximum sterile technique was used including antiseptics, cap, gloves, gown, hand hygiene, mask and sheet. Skin prep: Chlorhexidine; local anesthetic administered A antimicrobial bonded/coated triple lumen catheter was placed in the left internal jugular vein using the Seldinger technique under direct visualization with ultrasound.  Evaluation Blood flow good Complications: No apparent complications Patient did tolerate procedure well. Chest X-ray ordered to verify placement.  CXR: normal.  SIQUEIROS, Hailley Byers 07/31/2013, 11:56 PM
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
## 2013-07-31 NOTE — ED Notes (Signed)
Glucose 552, Co2 4.  Reported to Dr. Audie Pinto.

## 2013-07-31 NOTE — Telephone Encounter (Signed)
Patient Information:  Caller Name: Sorina  Phone: [phone]  Patient: Makhiya, [place]urn  Gender: Female  DOB: [birth year]  Age: 48 Years  PCP: Ricardo Jericho [facility])  Pregnant: No  Office Follow Up:  Does the office need to follow up with this patient?: No  Instructions For The Office: N/A   Symptoms  Reason For Call & Symptoms: 07/31/13 vomiting, dizziness, breathing hard, very thirsty, inside of mouth is dry.   Feels feverish, but hasn't checked temp.   Blood sugar is 450 x past 2 weeks due to having cold.   1550 blood sugar 450.  Spoke with Lattie Haw, CMA in office due to ER disposition, advised to send to ER now.  Advised pt to go to [facility] ER now, she states she will go to [facility] on [address] because it is closest.  Reviewed Health History In EMR: Yes  Reviewed Medications In EMR: Yes  Reviewed Allergies In EMR: Yes  Reviewed Surgeries / Procedures: Yes  Date of Onset of Symptoms: 07/31/2013 OB / GYN:  LMP: Unknown  Guideline(s) Used:  Diabetes - High Blood Sugar  Disposition Per Guideline:   Go to ED Now  Reason For Disposition Reached:   Vomiting and signs of dehydration (e.g., very dry mouth, lightheaded, etc.)  Advice Given:  N/A  Patient Will Follow Care Advice:  YES

## 2013-07-31 NOTE — ED Notes (Signed)
Pt reports emesis since this am, pt known diabetic. BG 400's this am. Pt reports shortness of breath. Pain to bilateral abdomen.
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
## 2013-08-01 ENCOUNTER — Other Ambulatory Visit: Payer: Self-pay

## 2013-08-01 DIAGNOSIS — E1110 Type 2 diabetes mellitus with ketoacidosis without coma: Secondary | ICD-10-CM

## 2013-08-01 DIAGNOSIS — E1165 Type 2 diabetes mellitus with hyperglycemia: Secondary | ICD-10-CM

## 2013-08-01 DIAGNOSIS — IMO0001 Reserved for inherently not codable concepts without codable children: Secondary | ICD-10-CM

## 2013-08-01 LAB — BASIC METABOLIC PANEL
BUN: 15 mg/dL (ref 6–23)
BUN: 14 mg/dL (ref 6–23)
BUN: 14 mg/dL (ref 6–23)
BUN: 13 mg/dL (ref 6–23)
BUN: 13 mg/dL (ref 6–23)
BUN: 12 mg/dL (ref 6–23)
BUN: 7 mg/dL (ref 6–23)
BUN: 9 mg/dL (ref 6–23)
CALCIUM: 7.8 mg/dL — AB (ref 8.4–10.5)
CALCIUM: 7.8 mg/dL — AB (ref 8.4–10.5)
CALCIUM: 7.7 mg/dL — AB (ref 8.4–10.5)
CALCIUM: 7.6 mg/dL — AB (ref 8.4–10.5)
CO2: 7 mEq/L — CL (ref 19–32)
CO2: 8 mEq/L — CL (ref 19–32)
CO2: 9 meq/L — AB (ref 19–32)
CO2: 10 mEq/L — CL (ref 19–32)
CO2: 11 mEq/L — ABNORMAL LOW (ref 19–32)
CO2: 10 mEq/L — CL (ref 19–32)
CO2: 11 mEq/L — ABNORMAL LOW (ref 19–32)
CO2: 12 mEq/L — ABNORMAL LOW (ref 19–32)
CREATININE: 0.78 mg/dL (ref 0.50–1.10)
CREATININE: 0.69 mg/dL (ref 0.50–1.10)
CREATININE: 0.66 mg/dL (ref 0.50–1.10)
CREATININE: 0.69 mg/dL (ref 0.50–1.10)
Calcium: 7.6 mg/dL — ABNORMAL LOW (ref 8.4–10.5)
Calcium: 7.4 mg/dL — ABNORMAL LOW (ref 8.4–10.5)
Calcium: 7.3 mg/dL — ABNORMAL LOW (ref 8.4–10.5)
Calcium: 7.5 mg/dL — ABNORMAL LOW (ref 8.4–10.5)
Chloride: 113 mEq/L — ABNORMAL HIGH (ref 96–112)
Chloride: 114 mEq/L — ABNORMAL HIGH (ref 96–112)
Chloride: 115 mEq/L — ABNORMAL HIGH (ref 96–112)
Chloride: 110 mEq/L (ref 96–112)
Chloride: 109 mEq/L (ref 96–112)
Chloride: 108 mEq/L (ref 96–112)
Chloride: 109 mEq/L (ref 96–112)
Chloride: 109 mEq/L (ref 96–112)
Creatinine, Ser: 0.69 mg/dL (ref 0.50–1.10)
Creatinine, Ser: 0.68 mg/dL (ref 0.50–1.10)
Creatinine, Ser: 0.68 mg/dL (ref 0.50–1.10)
Creatinine, Ser: 0.67 mg/dL (ref 0.50–1.10)
GFR calc Af Amer: >90 mL/min (ref >90)
GFR calc Af Amer: >90 mL/min (ref >90)
GFR calc non Af Amer: >90 mL/min (ref >90)
GFR calc non Af Amer: >90 mL/min (ref >90)
GFR calc non Af Amer: >90 mL/min (ref >90)
GLUCOSE: 179 mg/dL — AB (ref 70–99)
GLUCOSE: 186 mg/dL — AB (ref 70–99)
Glucose, Bld: 178 mg/dL — ABNORMAL HIGH (ref 70–99)
Glucose, Bld: 165 mg/dL — ABNORMAL HIGH (ref 70–99)
Glucose, Bld: 157 mg/dL — ABNORMAL HIGH (ref 70–99)
Glucose, Bld: 150 mg/dL — ABNORMAL HIGH (ref 70–99)
Glucose, Bld: 164 mg/dL — ABNORMAL HIGH (ref 70–99)
Glucose, Bld: 195 mg/dL — ABNORMAL HIGH (ref 70–99)
POTASSIUM: 3 meq/L — AB (ref 3.7–5.3)
Potassium: 3.9 mEq/L (ref 3.7–5.3)
Potassium: 3.7 mEq/L (ref 3.7–5.3)
Potassium: 3.5 mEq/L — ABNORMAL LOW (ref 3.7–5.3)
Potassium: 3.3 mEq/L — ABNORMAL LOW (ref 3.7–5.3)
Potassium: 3.1 mEq/L — ABNORMAL LOW (ref 3.7–5.3)
Potassium: 2.7 mEq/L — CL (ref 3.7–5.3)
Potassium: 3.1 mEq/L — ABNORMAL LOW (ref 3.7–5.3)
SODIUM: 140 meq/L (ref 137–147)
SODIUM: 135 meq/L — AB (ref 137–147)
SODIUM: 134 meq/L — AB (ref 137–147)
Sodium: 139 mEq/L (ref 137–147)
Sodium: 139 mEq/L (ref 137–147)
Sodium: 136 mEq/L — ABNORMAL LOW (ref 137–147)
Sodium: 135 mEq/L — ABNORMAL LOW (ref 137–147)
Sodium: 136 mEq/L — ABNORMAL LOW (ref 137–147)

## 2013-08-01 LAB — COMPREHENSIVE METABOLIC PANEL
ALBUMIN: 3.6 g/dL (ref 3.5–5.2)
ALK PHOS: 190 U/L — AB (ref 39–117)
ALT: 28 U/L (ref 0–35)
ALT: 30 U/L (ref 0–35)
AST: 10 U/L (ref 0–37)
AST: 8 U/L (ref 0–37)
Albumin: 3.4 g/dL — ABNORMAL LOW (ref 3.5–5.2)
Alkaline Phosphatase: 178 U/L — ABNORMAL HIGH (ref 39–117)
BUN: 16 mg/dL (ref 6–23)
BUN: 18 mg/dL (ref 6–23)
CO2: <7 mEq/L — CL (ref 19–32)
CO2: <7 mEq/L — CL (ref 19–32)
Calcium: 7.7 mg/dL — ABNORMAL LOW (ref 8.4–10.5)
Calcium: 7.8 mg/dL — ABNORMAL LOW (ref 8.4–10.5)
Chloride: 111 mEq/L (ref 96–112)
Chloride: 113 mEq/L — ABNORMAL HIGH (ref 96–112)
Creatinine, Ser: 0.82 mg/dL (ref 0.50–1.10)
Creatinine, Ser: 0.84 mg/dL (ref 0.50–1.10)
GFR calc Af Amer: >90 mL/min (ref >90)
GFR calc Af Amer: >90 mL/min (ref >90)
GFR calc non Af Amer: 81 mL/min — ABNORMAL LOW (ref >90)
GFR calc non Af Amer: 84 mL/min — ABNORMAL LOW (ref >90)
Glucose, Bld: 161 mg/dL — ABNORMAL HIGH (ref 70–99)
Glucose, Bld: 247 mg/dL — ABNORMAL HIGH (ref 70–99)
POTASSIUM: 4.1 meq/L (ref 3.7–5.3)
POTASSIUM: 4.6 meq/L (ref 3.7–5.3)
SODIUM: 140 meq/L (ref 137–147)
SODIUM: 141 meq/L (ref 137–147)
TOTAL PROTEIN: 7.4 g/dL (ref 6.0–8.3)
TOTAL PROTEIN: 7.7 g/dL (ref 6.0–8.3)
Total Bilirubin: 0.3 mg/dL (ref 0.3–1.2)
Total Bilirubin: 0.3 mg/dL (ref 0.3–1.2)

## 2013-08-01 LAB — MRSA PCR SCREENING: MRSA by PCR: POSITIVE — AB

## 2013-08-01 LAB — POCT I-STAT 3, VENOUS BLOOD GAS (G3P V)
Acid-base deficit: 26 mmol/L — ABNORMAL HIGH (ref 0.0–2.0)
BICARBONATE: 4 meq/L — AB (ref 20.0–24.0)
O2 Saturation: 65 %
PH VEN: 6.993 — AB (ref 7.250–7.300)
pCO2, Ven: 16.5 mmHg — ABNORMAL LOW (ref 45.0–50.0)
pO2, Ven: 50 mmHg — ABNORMAL HIGH (ref 30.0–45.0)

## 2013-08-01 LAB — GLUCOSE, CAPILLARY
GLUCOSE-CAPILLARY: 147 mg/dL — AB (ref 70–99)
GLUCOSE-CAPILLARY: 156 mg/dL — AB (ref 70–99)
GLUCOSE-CAPILLARY: 157 mg/dL — AB (ref 70–99)
GLUCOSE-CAPILLARY: 161 mg/dL — AB (ref 70–99)
GLUCOSE-CAPILLARY: 164 mg/dL — AB (ref 70–99)
GLUCOSE-CAPILLARY: 174 mg/dL — AB (ref 70–99)
GLUCOSE-CAPILLARY: 214 mg/dL — AB (ref 70–99)
GLUCOSE-CAPILLARY: 284 mg/dL — AB (ref 70–99)
Glucose-Capillary: 254 mg/dL — ABNORMAL HIGH (ref 70–99)
Glucose-Capillary: 184 mg/dL — ABNORMAL HIGH (ref 70–99)
Glucose-Capillary: 158 mg/dL — ABNORMAL HIGH (ref 70–99)
Glucose-Capillary: 161 mg/dL — ABNORMAL HIGH (ref 70–99)
Glucose-Capillary: 168 mg/dL — ABNORMAL HIGH (ref 70–99)
Glucose-Capillary: 163 mg/dL — ABNORMAL HIGH (ref 70–99)
Glucose-Capillary: 153 mg/dL — ABNORMAL HIGH (ref 70–99)
Glucose-Capillary: 144 mg/dL — ABNORMAL HIGH (ref 70–99)
Glucose-Capillary: 146 mg/dL — ABNORMAL HIGH (ref 70–99)
Glucose-Capillary: 138 mg/dL — ABNORMAL HIGH (ref 70–99)
Glucose-Capillary: 133 mg/dL — ABNORMAL HIGH (ref 70–99)
Glucose-Capillary: 157 mg/dL — ABNORMAL HIGH (ref 70–99)
Glucose-Capillary: 147 mg/dL — ABNORMAL HIGH (ref 70–99)

## 2013-08-01 LAB — TROPONIN I: Troponin I: <0.3 ng/mL (ref <0.30)

## 2013-08-01 MED ORDER — POTASSIUM CHLORIDE 10 MEQ/50ML IV SOLN
10.0000 meq | INTRAVENOUS | Status: AC
  Administered 2013-08-02 (×2): 10 meq via INTRAVENOUS
  Filled 2013-08-01 (×2): qty 50

## 2013-08-01 MED ORDER — POTASSIUM CHLORIDE 10 MEQ/50ML IV SOLN: 10.0000 meq | INTRAVENOUS | Status: DC

## 2013-08-01 MED ORDER — CHLORHEXIDINE GLUCONATE CLOTH 2 % EX PADS
6.0000 | MEDICATED_PAD | Freq: Every day | CUTANEOUS | Status: AC
  Administered 2013-08-01 – 2013-08-05 (×5): 6 via TOPICAL

## 2013-08-01 MED ORDER — PNEUMOCOCCAL VAC POLYVALENT 25 MCG/0.5ML IJ INJ
0.5000 mL | INJECTION | INTRAMUSCULAR | Status: AC
  Administered 2013-08-02: 0.5 mL via INTRAMUSCULAR
  Filled 2013-08-01: qty 0.5

## 2013-08-01 MED ORDER — MUPIROCIN 2 % EX OINT
1.0000 "application " | TOPICAL_OINTMENT | Freq: Two times a day (BID) | CUTANEOUS | Status: AC
  Administered 2013-08-01 – 2013-08-05 (×10): 1 via NASAL
  Filled 2013-08-01: qty 22

## 2013-08-01 MED ORDER — POTASSIUM CHLORIDE 10 MEQ/50ML IV SOLN
10.0000 meq | INTRAVENOUS | Status: AC
  Administered 2013-08-01 – 2013-08-02 (×6): 10 meq via INTRAVENOUS
  Filled 2013-08-01 (×6): qty 50

## 2013-08-01 NOTE — Progress Notes (Addendum)
CRITICAL VALUE ALERT  Critical value received:  CO2 10  Date of notification:  08/01/13  Time of notification:  1000  Critical value read back:yes  Nurse who received alert:  Olevia Perches   MD notified (1st page):  Asencion Noble  Time of first page:    MD notified (2nd page):  Time of second page:  Responding MD:     Time MD responded:  1000

## 2013-08-01 NOTE — Progress Notes (Signed)
Name: Kristie [name] MRN: [id number] DOB: [birth year]    ADMISSION DATE:  07/31/2013  PRIMARY SERVICE: PCCM  CHIEF COMPLAINT:  DKA  BRIEF PATIENT DESCRIPTION:  48 years old female with PMH for insulin dependent DM, HTN, dyslipidemia. Transferred from [facility] with severe DKA.  SIGNIFICANT EVENTS / STUDIES:    LINES / TUBES: - Left IJ CVC 1/16 - Foley catheter   CULTURES: - Pending  ANTIBIOTICS: No antibiotics    SUBJECTIVE:  Wants ice chips VITAL SIGNS: Temp:  [97.5 F (36.4 C)-98.6 F (37 C)] 97.8 F (36.6 C) (01/17 0809) Pulse Rate:  [105-125] 105 (01/17 0800) Resp:  [13-35] 18 (01/17 0800) BP: (86-182)/(52-97) 148/76 mmHg (01/17 0800) SpO2:  [100 %] 100 % (01/17 0800) Weight:  [104.327 kg (230 lb)] 104.327 kg (230 lb) (01/16 1723) HEMODYNAMICS: cv stable   ON RA    INTAKE / OUTPUT: Intake/Output     01/16 0701 - 01/17 0700 01/17 0701 - 01/18 0700   I.V. (mL/kg) 4995.6 (47.9) 281.2 (2.7)   Total Intake(mL/kg) 4995.6 (47.9) 281.2 (2.7)   Urine (mL/kg/hr) 3050    Total Output 3050     Net +1945.6 +281.2          PHYSICAL EXAMINATION: General: Pleasant female patient in no distress. Eyes: Anicteric sclerae. ENT: Oropharynx clear. Drymucous membranes. No thrush Lymph: No cervical, supraclavicular, or axillary lymphadenopathy. Heart: Normal S1, S2. Tachycardic. No murmurs, rubs, or gallops appreciated. No bruits, equal pulses. Lungs: Normal excursion, no dullness to percussion. Good air movement bilaterally, without wheezes or crackles. Normal upper airway sounds without evidence of stridor. Abdomen: Abdomen soft, non-tender and not distended, normoactive bowel sounds. No hepatosplenomegaly or masses. Musculoskeletal: No clubbing or synovitis. Skin: No rashes or lesions Neuro: No focal neurologic deficits.  LABS:  CBC  Recent Labs Lab 07/31/13 1758 07/31/13 2143  WBC 25.6* 29.8*  HGB 13.7 12.3  HCT 44.6 39.3  PLT 507*  432*   Coag's No results found for this basename: APTT, INR,  in the last 168 hours BMET  Recent Labs Lab 07/31/13 2352 08/01/13 0356 08/01/13 0556  NA 140 139 139  K 4.6 3.9 3.7  CL 111 113* 114*  CO2 <7* 7* 8*  BUN 18 15 14   CREATININE 0.84 0.78 0.69  GLUCOSE 247* 179* 178*   Electrolytes  Recent Labs Lab 07/31/13 2352 08/01/13 0356 08/01/13 0556  CALCIUM 7.7* 7.8* 7.8*   Sepsis Markers  Recent Labs Lab 07/31/13 2254  LATICACIDVEN 1.2   ABG No results found for this basename: PHART, PCO2ART, PO2ART,  in the last 168 hours Liver Enzymes  Recent Labs Lab 07/31/13 2352  AST 10  ALT 30  ALKPHOS 190*  BILITOT 0.3  ALBUMIN 3.6   Cardiac Enzymes  Recent Labs Lab 07/31/13 2331  TROPONINI <0.30   Glucose  Recent Labs Lab 08/01/13 0119 08/01/13 0222 08/01/13 0324 08/01/13 0428 08/01/13 0548 08/01/13 0648  GLUCAP 184* 158* 161* 174* 168* 163*    Imaging Dg Chest Port 1 View  08/01/2013   CLINICAL DATA:  Central venous line placement  EXAM: PORTABLE CHEST - 1 VIEW  COMPARISON:  07/31/2013 at 1709 hr  FINDINGS: Lungs are clear. No pleural effusion or pneumothorax.  The heart is normal in size.  Left IJ venous catheter terminates at the cavoatrial junction.  IMPRESSION: Left IJ venous catheter terminates at the cavoatrial junction.  No pneumothorax.   Electronically Signed   By: Henderson Newcomer.D.  On: 08/01/2013 00:04   Dg Chest Portable 1 View  07/31/2013   CLINICAL DATA:  Emesis.  Shortness of breath.  EXAM: PORTABLE CHEST - 1 VIEW  COMPARISON:  No priors.  FINDINGS: Lung volumes are normal. No consolidative airspace disease. No pleural effusions. No pneumothorax. No pulmonary nodule or mass noted. Pulmonary vasculature and the cardiomediastinal silhouette are within normal limits. Axial No radiographic evidence of acute cardiopulmonary disease.  IMPRESSION: No active disease.   Electronically Signed   By: Vinnie Langton M.D.   On: 07/31/2013  17:41     CXR:  No film 1/17.  ASSESSMENT / PLAN:  PULMONARY A:  1) No issues, respiratory distress secondary to severe metabolic acidosis P:   - No need for supplemental oxygen  CARDIOVASCULAR A:  1) Normotensive, tachycardic. Severe intravascular volume depletion. Trop normal, ecg normal P:  - - Continue IVF's NS at 150 cc/hr per DKA protocol plus D5 1/2 at 150   RENAL A:   1) Severe anion gap metabolic acidosis secondary to DKA P:   - IVF resuscitation to cont  per DKA protocol - Will cont follow chemistries per DKA protocol   GASTROINTESTINAL A:   1) Diarrhea resolved P:   - ice chips HEMATOLOGIC A:   1) No issues P:  - Will follow CBC  INFECTIOUS A:   1) No evidence of acute infection P:   - no abx  ENDOCRINE A:   1) Severe DKA   P:   -DKA protocol -isotonic saline until euvolemic -insulin gtt per protocol,  transition to sq when anion gap is normal, bicarb is >18  -add D51/2 NS after blood glucose is <250, -close observation of K, replace as indicated.  -close observation of PO4 and replace as indicated  NEUROLOGIC A:   1) No issues P:   - Will monitor mental status closely   I have personally obtained a history, examined the patient, evaluated laboratory and imaging results, formulated the assessment and plan and placed orders. CRITICAL CARE: The patient is critically ill with multiple organ systems failure and requires high complexity decision making for assessment and support, frequent evaluation and titration of therapies, application of advanced monitoring technologies and extensive interpretation of multiple databases. Critical Care Time devoted to patient care services described in this note is 60 minutes.   Mariel Sleet Beeper  [phone]  Cell  [phone]  If no response or cell goes to voicemail, call beeper [phone]  Pulmonary and [place] Pager: [phone]  08/01/2013, 8:54  AM

## 2013-08-01 NOTE — Progress Notes (Signed)
CRITICAL VALUE ALERT  Critical value received: Co2 <7 Multiple calls. Date of notification:  08/01/2013  Time of notification:  0300  Critical value read back: yes  Nurse who received alert: Felipa Evener  MD notified (1st page): Dr Antony Odea  Time of first page:  multiple pages  MD notified (2nd page):  Time of second page:  Responding MD:  Dr. Antony Odea.  Time MD responded:  Multiple responds.

## 2013-08-01 NOTE — Progress Notes (Signed)
08/01/13 1940  CRITICAL VALUE ALERT  Critical value received:  K - 2.7  Date of notification:  08/01/13  Time of notification:  1940  Critical value read back: YES  Nurse who received alert:  Wyn Quaker RN   MD notified (1st page):  eLink MD Melvyn Novas   I: q2 bmets, 8 runs of K  Wyn Quaker RN

## 2013-08-01 NOTE — Progress Notes (Signed)
Critical value: CO2 10  Dr. Joya Gaskins is aware of patient's multiple low CO2 labs. Patient is on DKA protocol. Will continue to monitor Anion Gap.  Olevia Perches, RN

## 2013-08-01 NOTE — Progress Notes (Signed)
CRITICAL VALUE ALERT  Critical value received:  CO2 9   Date of notification:  08/01/2013  Time of notification:  0800  Critical value read back:yes  Nurse who received alert:  Olevia Perches, RN  MD notified (1st page):  Asencion Noble  Time of first page: verbal  MD notified (2nd page):  Time of second page:  Responding MD:  Asencion Noble  Time MD responded:  0800

## 2013-08-02 ENCOUNTER — Other Ambulatory Visit: Payer: Self-pay

## 2013-08-02 ENCOUNTER — Encounter (HOSPITAL_COMMUNITY): Payer: Self-pay | Admitting: Internal Medicine

## 2013-08-02 ENCOUNTER — Inpatient Hospital Stay (HOSPITAL_COMMUNITY): Payer: Medicare Other

## 2013-08-02 LAB — GLUCOSE, CAPILLARY
GLUCOSE-CAPILLARY: 163 mg/dL — AB (ref 70–99)
GLUCOSE-CAPILLARY: 200 mg/dL — AB (ref 70–99)
GLUCOSE-CAPILLARY: 120 mg/dL — AB (ref 70–99)
GLUCOSE-CAPILLARY: 160 mg/dL — AB (ref 70–99)
GLUCOSE-CAPILLARY: 175 mg/dL — AB (ref 70–99)
GLUCOSE-CAPILLARY: 183 mg/dL — AB (ref 70–99)
GLUCOSE-CAPILLARY: 99 mg/dL (ref 70–99)
Glucose-Capillary: 121 mg/dL — ABNORMAL HIGH (ref 70–99)
Glucose-Capillary: 146 mg/dL — ABNORMAL HIGH (ref 70–99)
Glucose-Capillary: 155 mg/dL — ABNORMAL HIGH (ref 70–99)
Glucose-Capillary: 159 mg/dL — ABNORMAL HIGH (ref 70–99)
Glucose-Capillary: 163 mg/dL — ABNORMAL HIGH (ref 70–99)
Glucose-Capillary: 165 mg/dL — ABNORMAL HIGH (ref 70–99)
Glucose-Capillary: 168 mg/dL — ABNORMAL HIGH (ref 70–99)
Glucose-Capillary: 170 mg/dL — ABNORMAL HIGH (ref 70–99)
Glucose-Capillary: 171 mg/dL — ABNORMAL HIGH (ref 70–99)
Glucose-Capillary: 173 mg/dL — ABNORMAL HIGH (ref 70–99)
Glucose-Capillary: 175 mg/dL — ABNORMAL HIGH (ref 70–99)
Glucose-Capillary: 176 mg/dL — ABNORMAL HIGH (ref 70–99)
Glucose-Capillary: 181 mg/dL — ABNORMAL HIGH (ref 70–99)
Glucose-Capillary: 181 mg/dL — ABNORMAL HIGH (ref 70–99)
Glucose-Capillary: 356 mg/dL — ABNORMAL HIGH (ref 70–99)

## 2013-08-02 LAB — CBC
HCT: 30.5 % — ABNORMAL LOW (ref 36.0–46.0)
HEMOGLOBIN: 10.2 g/dL — AB (ref 12.0–15.0)
MCH: 24.3 pg — ABNORMAL LOW (ref 26.0–34.0)
MCHC: 33.4 g/dL (ref 30.0–36.0)
MCV: 72.8 fL — ABNORMAL LOW (ref 78.0–100.0)
PLATELETS: 282 10*3/uL (ref 150–400)
RBC: 4.19 MIL/uL (ref 3.87–5.11)
RDW: 16.4 % — ABNORMAL HIGH (ref 11.5–15.5)
WBC: 9.8 10*3/uL (ref 4.0–10.5)

## 2013-08-02 LAB — BASIC METABOLIC PANEL
BUN: 5 mg/dL — ABNORMAL LOW (ref 6–23)
BUN: 5 mg/dL — AB (ref 6–23)
BUN: 5 mg/dL — ABNORMAL LOW (ref 6–23)
BUN: 5 mg/dL — ABNORMAL LOW (ref 6–23)
BUN: 6 mg/dL (ref 6–23)
CALCIUM: 7.7 mg/dL — AB (ref 8.4–10.5)
CALCIUM: 7.8 mg/dL — AB (ref 8.4–10.5)
CALCIUM: 7.7 mg/dL — AB (ref 8.4–10.5)
CHLORIDE: 109 meq/L (ref 96–112)
CHLORIDE: 100 meq/L (ref 96–112)
CO2: 12 mEq/L — ABNORMAL LOW (ref 19–32)
CO2: 13 meq/L — AB (ref 19–32)
CO2: 14 mEq/L — ABNORMAL LOW (ref 19–32)
CO2: 14 mEq/L — ABNORMAL LOW (ref 19–32)
CO2: 15 meq/L — AB (ref 19–32)
CREATININE: 0.67 mg/dL (ref 0.50–1.10)
CREATININE: 0.68 mg/dL (ref 0.50–1.10)
CREATININE: 0.7 mg/dL (ref 0.50–1.10)
Calcium: 7.4 mg/dL — ABNORMAL LOW (ref 8.4–10.5)
Calcium: 7.5 mg/dL — ABNORMAL LOW (ref 8.4–10.5)
Chloride: 111 mEq/L (ref 96–112)
Chloride: 109 mEq/L (ref 96–112)
Chloride: 106 mEq/L (ref 96–112)
Creatinine, Ser: 0.66 mg/dL (ref 0.50–1.10)
Creatinine, Ser: 0.75 mg/dL (ref 0.50–1.10)
GFR calc Af Amer: >90 mL/min (ref >90)
GFR calc Af Amer: >90 mL/min (ref >90)
GFR calc non Af Amer: >90 mL/min (ref >90)
GFR calc non Af Amer: >90 mL/min (ref >90)
GFR calc non Af Amer: >90 mL/min (ref >90)
GLUCOSE: 129 mg/dL — AB (ref 70–99)
GLUCOSE: 181 mg/dL — AB (ref 70–99)
GLUCOSE: 376 mg/dL — AB (ref 70–99)
Glucose, Bld: 191 mg/dL — ABNORMAL HIGH (ref 70–99)
Glucose, Bld: 208 mg/dL — ABNORMAL HIGH (ref 70–99)
POTASSIUM: 3.3 meq/L — AB (ref 3.7–5.3)
Potassium: 3 mEq/L — ABNORMAL LOW (ref 3.7–5.3)
Potassium: 3.6 mEq/L — ABNORMAL LOW (ref 3.7–5.3)
Potassium: 3.6 mEq/L — ABNORMAL LOW (ref 3.7–5.3)
Potassium: 3.7 mEq/L (ref 3.7–5.3)
SODIUM: 139 meq/L (ref 137–147)
SODIUM: 132 meq/L — AB (ref 137–147)
Sodium: 135 mEq/L — ABNORMAL LOW (ref 137–147)
Sodium: 137 mEq/L (ref 137–147)
Sodium: 136 mEq/L — ABNORMAL LOW (ref 137–147)

## 2013-08-02 LAB — MAGNESIUM: MAGNESIUM: 1.6 mg/dL (ref 1.5–2.5)

## 2013-08-02 LAB — HEPATIC FUNCTION PANEL
ALBUMIN: 2.5 g/dL — AB (ref 3.5–5.2)
ALT: 16 U/L (ref 0–35)
AST: 8 U/L (ref 0–37)
Alkaline Phosphatase: 123 U/L — ABNORMAL HIGH (ref 39–117)
BILIRUBIN TOTAL: 0.9 mg/dL (ref 0.3–1.2)
Bilirubin, Direct: <0.2 mg/dL (ref 0.0–0.3)
Total Protein: 5.5 g/dL — ABNORMAL LOW (ref 6.0–8.3)

## 2013-08-02 LAB — PHOSPHORUS: Phosphorus: <0.5 mg/dL — CL (ref 2.3–4.6)

## 2013-08-02 MED ORDER — INSULIN ASPART 100 UNIT/ML ~~LOC~~ SOLN
0.0000 [IU] | Freq: Three times a day (TID) | SUBCUTANEOUS | Status: DC
  Administered 2013-08-02: 2 [IU] via SUBCUTANEOUS
  Administered 2013-08-03: 8 [IU] via SUBCUTANEOUS

## 2013-08-02 MED ORDER — INSULIN GLARGINE 100 UNIT/ML ~~LOC~~ SOLN
25.0000 [IU] | Freq: Every day | SUBCUTANEOUS | Status: DC
  Administered 2013-08-02: 25 [IU] via SUBCUTANEOUS
  Filled 2013-08-02 (×2): qty 0.25

## 2013-08-02 MED ORDER — POTASSIUM CHLORIDE 10 MEQ/50ML IV SOLN
10.0000 meq | INTRAVENOUS | Status: AC
  Administered 2013-08-02 (×3): 10 meq via INTRAVENOUS
  Filled 2013-08-02 (×2): qty 50

## 2013-08-02 MED ORDER — POTASSIUM CHLORIDE 10 MEQ/50ML IV SOLN
10.0000 meq | INTRAVENOUS | Status: AC
  Administered 2013-08-02 (×4): 10 meq via INTRAVENOUS
  Filled 2013-08-02 (×4): qty 50

## 2013-08-02 MED ORDER — POTASSIUM CHLORIDE IN NACL 20-0.45 MEQ/L-% IV SOLN
INTRAVENOUS | Status: DC
  Administered 2013-08-02 (×2): via INTRAVENOUS
  Filled 2013-08-02 (×4): qty 1000

## 2013-08-02 MED ORDER — INSULIN ASPART 100 UNIT/ML ~~LOC~~ SOLN
0.0000 [IU] | Freq: Every day | SUBCUTANEOUS | Status: DC
  Administered 2013-08-02: 5 [IU] via SUBCUTANEOUS

## 2013-08-02 MED ORDER — MAGNESIUM SULFATE 40 MG/ML IJ SOLN
4.0000 g | Freq: Once | INTRAMUSCULAR | Status: AC
  Administered 2013-08-02: 4 g via INTRAVENOUS
  Filled 2013-08-02: qty 100

## 2013-08-02 MED ORDER — POTASSIUM PHOSPHATE DIBASIC 3 MMOLE/ML IV SOLN
30.0000 mmol | Freq: Once | INTRAVENOUS | Status: AC
  Administered 2013-08-02: 30 mmol via INTRAVENOUS
  Filled 2013-08-02: qty 10

## 2013-08-02 NOTE — Progress Notes (Signed)
PULMONARY/ CRITICAL CARE MEDICINE  Name: Kristie [name] MRN: [id number] DOB: May 21, 1966    ADMISSION DATE:  07/31/2013  PRIMARY SERVICE: PCCM  CHIEF COMPLAINT:  DKA  BRIEF PATIENT DESCRIPTION:  48 years old female with PMH for insulin dependent DM, HTN, dyslipidemia. Transferred from [facility] with severe DKA.  SIGNIFICANT EVENTS / STUDIES:    LINES / TUBES: - Left IJ CVC 1/16 - Foley catheter 1/16 >>> 1/18   CULTURES: - Pending  ANTIBIOTICS: No antibiotics  SUBJECTIVE:  Reports she feels much better.  No complaints.  VITAL SIGNS: Temp:  [97.6 F (36.4 C)-98.7 F (37.1 C)] 98.7 F (37.1 C) (01/18 0419) Pulse Rate:  [94-110] 101 (01/18 0600) Resp:  [0-27] 19 (01/18 0600) BP: (104-154)/(59-83) 126/72 mmHg (01/18 0600) SpO2:  [98 %-100 %] 100 % (01/18 0600) HEMODYNAMICS: cv stable   ON RA    INTAKE / OUTPUT: Intake/Output     01/17 0701 - 01/18 0700 01/18 0701 - 01/19 0700   P.O. 100    I.V. (mL/kg) 6379 (61.1)    IV Piggyback 400    Total Intake(mL/kg) 6879 (65.9)    Urine (mL/kg/hr) 3085 (1.2)    Total Output 3085     Net +3794            PHYSICAL EXAMINATION: General: sitting in chair, NAD HEENT: [place]/AT EOMI Cardiac: RRR, no rubs, murmurs or gallops Pulm: clear to auscultation bilaterally, moving normal volumes of air Abd: soft, nontender, nondistended, BS present Ext: warm and well perfused, no pedal edema Neuro: alert and oriented X4   LABS:  CBC  Recent Labs Lab 07/31/13 1758 07/31/13 2143 08/02/13 0522  WBC 25.6* 29.8* 9.8  HGB 13.7 12.3 10.2*  HCT 44.6 39.3 30.5*  PLT 507* 432* 282   Coag's No results found for this basename: APTT, INR,  in the last 168 hours BMET  Recent Labs Lab 08/01/13 1850 08/02/13 0230 08/02/13 0522  NA 135* 135* 139  K 2.7* 3.3* 3.0*  CL 109 109 111  CO2 11* 12* 13*  BUN 9 5* 5*  CREATININE 0.69 0.67 0.68  GLUCOSE 186* 191* 129*   Electrolytes  Recent Labs Lab  08/01/13 1850 08/02/13 0230 08/02/13 0522  CALCIUM 7.3* 7.4* 7.7*  MG  --   --  1.6  PHOS  --   --  <0.5*   Sepsis Markers  Recent Labs Lab 07/31/13 2254  LATICACIDVEN 1.2   ABG No results found for this basename: PHART, PCO2ART, PO2ART,  in the last 168 hours Liver Enzymes  Recent Labs Lab 07/31/13 2352 08/02/13 0522  AST 10 8  ALT 30 16  ALKPHOS 190* 123*  BILITOT 0.3 0.9  ALBUMIN 3.6 2.5*   Cardiac Enzymes  Recent Labs Lab 07/31/13 2331  TROPONINI <0.30   Glucose  Recent Labs Lab 08/01/13 2353 08/02/13 0059 08/02/13 0203 08/02/13 0309 08/02/13 0338 08/02/13 0518  GLUCAP 163* 176* 173* 175* 200* 121*    Imaging Dg Chest Port 1 View  08/01/2013   CLINICAL DATA:  Central venous line placement  EXAM: PORTABLE CHEST - 1 VIEW  COMPARISON:  07/31/2013 at 1709 hr  FINDINGS: Lungs are clear. No pleural effusion or pneumothorax.  The heart is normal in size.  Left IJ venous catheter terminates at the cavoatrial junction.  IMPRESSION: Left IJ venous catheter terminates at the cavoatrial junction.  No pneumothorax.   Electronically Signed   By: Julian Hy M.D.   On: 08/01/2013 00:04  Dg Chest Portable 1 View  07/31/2013   CLINICAL DATA:  Emesis.  Shortness of breath.  EXAM: PORTABLE CHEST - 1 VIEW  COMPARISON:  No priors.  FINDINGS: Lung volumes are normal. No consolidative airspace disease. No pleural effusions. No pneumothorax. No pulmonary nodule or mass noted. Pulmonary vasculature and the cardiomediastinal silhouette are within normal limits. Axial No radiographic evidence of acute cardiopulmonary disease.  IMPRESSION: No active disease.   Electronically Signed   By: Vinnie Langton M.D.   On: 07/31/2013 17:41     CXR: 1/18 >>>  Left IJ in good position, no pulmonary infiltrate appreciated.    ASSESSMENT / PLAN:  PULMONARY A:  No issues, respiratory distress secondary to severe metabolic acidosis P:   No need for supplemental  oxygen  CARDIOVASCULAR A:  Normotensive. intravascular volume depletion. P:  IVF   RENAL A:   Severe anion gap metabolic acidosis secondary to DKA- improving Hypokalemia Hypophosphatemia Hypomagnesemia P:   IVF 1/2 NS with KCL BMP q2 until off insulin drip Mag 4g K Phos 31mmol Repleating K   GASTROINTESTINAL A:   Diarrhea resolved P:   NPO w ice chips Advance diet once AG closes  HEMATOLOGIC A:   No issues DVT PPx P:  CBC Heparin [place]  INFECTIOUS A:    No evidence of acute infection P:   no abx  ENDOCRINE A:   Severe DKA-  Resolving P:   -DKA protocol -insulin gtt per protocol,  transition to sq when anion gap is normal, bicarb is >18  -close observation of K, replace as indicated.  -close observation of PO4 and replace as indicated  NEUROLOGIC A:   No issues P:   - Will monitor mental status closely  Joni Reining, DO PGY-1  Patient is stable for transfer to SDU.   Pt seen and examined with Joni Reining ,  I agree with the above note with edits.  Plan to stop insulin drip when gap closes, hopefully later this PM. Glyn Ade  [phone]  Cell  [phone]  If no response or cell goes to voicemail, call beeper [phone]

## 2013-08-02 NOTE — Progress Notes (Signed)
[facility] Progress Note Patient Name: SOLIANA KITKO DOB: 27-Dec-1965 MRN: [id number]  Date of Service  08/02/2013   HPI/Events of Note   DKA resolving Hypokalemia despite copious replacement hypophosphatemia  eICU Interventions  kphos Change IVF to 1/2NS with KCl   Intervention Category Minor Interventions: Electrolytes abnormality - evaluation and management  Jazion Atteberry 08/02/2013, 6:19 AM

## 2013-08-02 NOTE — Progress Notes (Signed)
08/02/13 0345  Pt potassium 3.3 after 8 runs of K. eLink MD McQuaid made aware.  I: 4 more runs of K ordered.   Wyn Quaker RN

## 2013-08-02 NOTE — Progress Notes (Signed)
08/01/13 2230    Pt had Bmet sent at 08/01/13 2200. Bmet ran in [facility] Lab incorrectly under the time 08/01/13 1830.    Wyn Quaker RN

## 2013-08-03 ENCOUNTER — Ambulatory Visit: Payer: Medicare Other | Admitting: Family Medicine

## 2013-08-03 LAB — BASIC METABOLIC PANEL
BUN: 7 mg/dL (ref 6–23)
BUN: 8 mg/dL (ref 6–23)
BUN: 10 mg/dL (ref 6–23)
BUN: 7 mg/dL (ref 6–23)
BUN: 10 mg/dL (ref 6–23)
CALCIUM: 8 mg/dL — AB (ref 8.4–10.5)
CALCIUM: 8 mg/dL — AB (ref 8.4–10.5)
CALCIUM: 8.2 mg/dL — AB (ref 8.4–10.5)
CO2: 16 mEq/L — ABNORMAL LOW (ref 19–32)
CO2: 16 meq/L — AB (ref 19–32)
CO2: 14 meq/L — AB (ref 19–32)
CO2: 19 mEq/L (ref 19–32)
CO2: 20 mEq/L (ref 19–32)
CREATININE: 0.7 mg/dL (ref 0.50–1.10)
CREATININE: 0.69 mg/dL (ref 0.50–1.10)
Calcium: 7.7 mg/dL — ABNORMAL LOW (ref 8.4–10.5)
Calcium: 8.3 mg/dL — ABNORMAL LOW (ref 8.4–10.5)
Chloride: 105 mEq/L (ref 96–112)
Chloride: 104 mEq/L (ref 96–112)
Chloride: 102 mEq/L (ref 96–112)
Chloride: 104 mEq/L (ref 96–112)
Chloride: 104 mEq/L (ref 96–112)
Creatinine, Ser: 0.72 mg/dL (ref 0.50–1.10)
Creatinine, Ser: 0.68 mg/dL (ref 0.50–1.10)
Creatinine, Ser: 0.59 mg/dL (ref 0.50–1.10)
GFR calc Af Amer: >90 mL/min (ref >90)
GFR calc Af Amer: >90 mL/min (ref >90)
GFR calc Af Amer: >90 mL/min (ref >90)
GFR calc non Af Amer: >90 mL/min (ref >90)
GLUCOSE: 283 mg/dL — AB (ref 70–99)
GLUCOSE: 303 mg/dL — AB (ref 70–99)
Glucose, Bld: 352 mg/dL — ABNORMAL HIGH (ref 70–99)
Glucose, Bld: 170 mg/dL — ABNORMAL HIGH (ref 70–99)
Glucose, Bld: 133 mg/dL — ABNORMAL HIGH (ref 70–99)
POTASSIUM: 3.6 meq/L — AB (ref 3.7–5.3)
Potassium: 3.7 mEq/L (ref 3.7–5.3)
Potassium: 3.7 mEq/L (ref 3.7–5.3)
Potassium: 3.4 mEq/L — ABNORMAL LOW (ref 3.7–5.3)
Potassium: 3.7 mEq/L (ref 3.7–5.3)
SODIUM: 137 meq/L (ref 137–147)
SODIUM: 137 meq/L (ref 137–147)
Sodium: 136 mEq/L — ABNORMAL LOW (ref 137–147)
Sodium: 136 mEq/L — ABNORMAL LOW (ref 137–147)
Sodium: 136 mEq/L — ABNORMAL LOW (ref 137–147)

## 2013-08-03 LAB — MAGNESIUM: Magnesium: 2.1 mg/dL (ref 1.5–2.5)

## 2013-08-03 LAB — CBC
HCT: 28.5 % — ABNORMAL LOW (ref 36.0–46.0)
HEMOGLOBIN: 9.5 g/dL — AB (ref 12.0–15.0)
MCH: 24.4 pg — ABNORMAL LOW (ref 26.0–34.0)
MCHC: 33.3 g/dL (ref 30.0–36.0)
MCV: 73.1 fL — ABNORMAL LOW (ref 78.0–100.0)
Platelets: 253 10*3/uL (ref 150–400)
RBC: 3.9 MIL/uL (ref 3.87–5.11)
RDW: 16.5 % — AB (ref 11.5–15.5)
WBC: 8 10*3/uL (ref 4.0–10.5)

## 2013-08-03 LAB — GLUCOSE, CAPILLARY
GLUCOSE-CAPILLARY: 284 mg/dL — AB (ref 70–99)
GLUCOSE-CAPILLARY: 234 mg/dL — AB (ref 70–99)
GLUCOSE-CAPILLARY: 169 mg/dL — AB (ref 70–99)
GLUCOSE-CAPILLARY: 195 mg/dL — AB (ref 70–99)
GLUCOSE-CAPILLARY: 146 mg/dL — AB (ref 70–99)
Glucose-Capillary: 314 mg/dL — ABNORMAL HIGH (ref 70–99)
Glucose-Capillary: 269 mg/dL — ABNORMAL HIGH (ref 70–99)
Glucose-Capillary: 177 mg/dL — ABNORMAL HIGH (ref 70–99)
Glucose-Capillary: 188 mg/dL — ABNORMAL HIGH (ref 70–99)
Glucose-Capillary: 130 mg/dL — ABNORMAL HIGH (ref 70–99)

## 2013-08-03 LAB — PHOSPHORUS: Phosphorus: 1.6 mg/dL — ABNORMAL LOW (ref 2.3–4.6)

## 2013-08-03 MED ORDER — VENLAFAXINE HCL ER 150 MG PO CP24
300.0000 mg | ORAL_CAPSULE | Freq: Every day | ORAL | Status: DC
  Administered 2013-08-04 – 2013-08-06 (×3): 300 mg via ORAL
  Filled 2013-08-03 (×4): qty 2

## 2013-08-03 MED ORDER — SODIUM CHLORIDE 0.9 % IV SOLN: INTRAVENOUS | Status: DC

## 2013-08-03 MED ORDER — DEXTROSE-NACL 5-0.45 % IV SOLN
INTRAVENOUS | Status: DC
  Administered 2013-08-03: 15:00:00 via INTRAVENOUS

## 2013-08-03 MED ORDER — INSULIN REGULAR BOLUS VIA INFUSION
0.0000 [IU] | Freq: Three times a day (TID) | INTRAVENOUS | Status: DC
  Filled 2013-08-03: qty 10

## 2013-08-03 MED ORDER — DEXTROSE 50 % IV SOLN: 25.0000 mL | INTRAVENOUS | Status: DC | PRN

## 2013-08-03 MED ORDER — DEXTROSE-NACL 5-0.45 % IV SOLN: INTRAVENOUS | Status: DC

## 2013-08-03 MED ORDER — SODIUM CHLORIDE 0.9 % IV SOLN
INTRAVENOUS | Status: DC
  Administered 2013-08-03: 2.8 [IU]/h via INTRAVENOUS
  Administered 2013-08-03: 2.5 [IU]/h via INTRAVENOUS
  Administered 2013-08-03: 5.1 [IU]/h via INTRAVENOUS
  Administered 2013-08-03: 3 [IU]/h via INTRAVENOUS
  Filled 2013-08-03 (×2): qty 1

## 2013-08-03 MED ORDER — HEPARIN SODIUM (PORCINE) 5000 UNIT/ML IJ SOLN
5000.0000 [IU] | Freq: Three times a day (TID) | INTRAMUSCULAR | Status: DC
  Administered 2013-08-03 – 2013-08-06 (×10): 5000 [IU] via SUBCUTANEOUS
  Filled 2013-08-03 (×12): qty 1

## 2013-08-03 MED ORDER — CLONAZEPAM 1 MG PO TABS
1.0000 mg | ORAL_TABLET | Freq: Three times a day (TID) | ORAL | Status: DC | PRN
  Administered 2013-08-04 – 2013-08-05 (×2): 1 mg via ORAL
  Filled 2013-08-03: qty 2
  Filled 2013-08-03: qty 1

## 2013-08-03 MED ORDER — SODIUM CHLORIDE 0.9 % IV SOLN
INTRAVENOUS | Status: DC
  Administered 2013-08-03: 11:00:00 via INTRAVENOUS

## 2013-08-03 NOTE — Progress Notes (Signed)
Inpatient Diabetes Program Recommendations  AACE/ADA: New Consensus Statement on Inpatient Glycemic Control (2013)  Target Ranges:  Prepandial:   less than 140 mg/dL      Peak postprandial:   less than 180 mg/dL (1-2 hours)      Critically ill patients:  140 - 180 mg/dL   Pt's home insulin regimen documented as 70/30 86 units in the am and 36 units pm (ac dinner) Total basal insulin calculates as 81 units NPH and 40 units Regular (?Novolog) Per below: while hospitalized, might want to consider using Lantus starting at 40 units (1/2 home dose) and Meal coverage of 10 units tidwc  Correction using moderate tidwc and the HS scale.  Inpatient Diabetes Program Recommendations Insulin - Basal: Start with 40 units (or 20 units bid) Correction (SSI): moderate and HS Insulin - Meal Coverage: 10 units tidwc  Will see patient today to assess need/recommendations for home therapy.  Thank you, Rosita Kea, RN, CNS, Diabetes Coordinator [phone])

## 2013-08-03 NOTE — Progress Notes (Signed)
CSW consulted because pt has concerns with managing diabetes. CSW called RNCM, and RNCM will place diabetes education consult. CSW signing off.   Ky Barban, MSW, [facility] Clinical Social Worker [phone]

## 2013-08-03 NOTE — Progress Notes (Signed)
[facility] [place] TEAM 1 - Stepdown/ICU TEAM  Kristie [name] KGU:[id number] DOB: 12/26/1965 DOA: 07/31/2013 PCP: Tammi Sou, MD  Brief narrative: 48 year old female patient with non-insulin-dependent diabetes, hypertension and dyslipidemia. [place] [facility] with severe DKA. Her glucose at presentation was 495 with white cells 25,600. Her anion gap was 37. She was admitted to the ICU where she was started on a insulin drip. On 08/02/2013 she was transitioned from insulin infusion to subcutaneous insulin with sliding scale. She was deemed appropriate to transfer to step down.  Assessment/Plan:    DKA (diabetic ketoacidoses) -Anion gap back up and therefore resuming insulin drip -Will allow car modified diet IV insulin meal coverage but anion gap or sugars remain elevated or increase further we'll need to back off to sugar-free clears -Patient and mother both endorses patient with a gestational of diabetes at later transitioned to chronic diagnosis -Patient has had difficulty with labile sugars most recently did not do well on Lantus nor with the addition of Victoza -Most recent change was to 70/30 insulin and initially did well but had noticed increasing difficulties with hyperglycemia -Patient would like to see if appropriate for an insulin pump -Previously saw endocrinologist with [facility] and she will need to return to endocrinologist to be evaluated for insulin pump    HYPERTENSION -Not on medications at home    ADD (attention deficit disorder)/depression/anxiety -on Effexor and Klonopin    HYPERLIPIDEMIA -Apparently not on medications prior to admission    ANEMIA, IRON DEFICIENCY -Baseline hemoglobin once hydrated between 9.5 and 10.2   DVT prophylaxis: Subcutaneous heparin Code Status: Full Family Communication: Patient's mother at bedside Disposition Plan/Expected LOS: Remain in step  down  Consultants: None  Procedures: None  Antibiotics: None  HPI/Subjective: Patient alert and continues to endorse generalized malaise. Had multiple questions regarding management of her diabetes. His questions were answered by the attending physician. No chest pain or shortness of breath.  Objective: Blood pressure 133/104, pulse 108, temperature 98 F (36.7 C), temperature source Oral, resp. rate 16, height 5\' 7"  (1.702 m), weight 108.7 kg (239 lb 10.2 oz), last menstrual period 07/16/2013, SpO2 100.00%.  Intake/Output Summary (Last 24 hours) at 08/03/13 1858 Last data filed at 08/03/13 1800  Gross per 24 hour  Intake 1998.11 ml  Output   3351 ml  Net -1352.89 ml   Exam: General: No acute respiratory distress Lungs: Clear to auscultation bilaterally without wheezes or crackles, RA Cardiovascular: Regular rate and rhythm without murmur gallop or rub normal S1 and S2, no peripheral edema or JVD Abdomen: Nontender, nondistended, soft, bowel sounds positive, no rebound, no ascites, no appreciable mass Musculoskeletal: No significant cyanosis, clubbing of bilateral lower extremities Neurological: Alert and oriented x 3, moves all extremities x 4 without focal neurological deficits, CN 2-12 intact  Scheduled Meds:  Scheduled Meds: . Chlorhexidine Gluconate Cloth  6 each Topical Q0600  . heparin  5,000 Units Subcutaneous Q8H  . insulin regular  0-10 Units Intravenous TID WC  . mupirocin ointment  1 application Nasal BID  . venlafaxine XR  300 mg Oral Daily   Continuous Infusions: . sodium chloride Stopped (08/03/13 1529)  . dextrose 5 % and 0.45% NaCl 75 mL/hr at 08/03/13 1529  . insulin (NOVOLIN-R) infusion 2.8 Units/hr (08/03/13 1843)   Data Reviewed: Basic Metabolic Panel:  Recent Labs Lab 08/02/13 0230 08/02/13 0522  08/02/13 2000 08/03/13 08/03/13 0605 08/03/13 0944 08/03/13 1344  NA 135* 139  < > 132*  136* 136* 136* 137  K 3.3* 3.0*  < > 3.7 3.6* 3.7 3.7  3.4*  CL 109 111  < > 100 105 104 102 104  CO2 12* 13*  < > 15* 16* 14* 16* 19  GLUCOSE 191* 129*  < > 376* 283* 303* 352* 170*  BUN 5* 5*  < > 6 7 7 8 10   CREATININE 0.67 0.68  < > 0.75 0.72 0.70 0.69 0.68  CALCIUM 7.4* 7.7*  < > 7.5* 7.7* 8.0* 8.0* 8.2*  MG  --  1.6  --   --   --  2.1  --   --   PHOS  --  <0.5*  --   --   --  1.6*  --   --   < > = values in this interval not displayed. Liver Function Tests:  Recent Labs Lab 07/31/13 2352 08/01/13 0226 08/02/13 0522  AST 10 8 8   ALT 30 28 16   ALKPHOS 190* 178* 123*  BILITOT 0.3 0.3 0.9  PROT 7.7 7.4 5.5*  ALBUMIN 3.6 3.4* 2.5*   CBC:  Recent Labs Lab 07/31/13 1758 07/31/13 2143 08/02/13 0522 08/03/13 1040  WBC 25.6* 29.8* 9.8 8.0  NEUTROABS 21.2*  --   --   --   HGB 13.7 12.3 10.2* 9.5*  HCT 44.6 39.3 30.5* 28.5*  MCV 79.5 79.7 72.8* 73.1*  PLT 507* 432* 282 253   CBG:  Recent Labs Lab 08/02/13 1732 08/02/13 2307 08/03/13 0758 08/03/13 1101 08/03/13 1200  GLUCAP 181* 356* 284* 314* 269*    Recent Results (from the past 240 hour(s))  MRSA PCR SCREENING     Status: Abnormal   Collection Time    07/31/13  9:23 PM      Result Value Range Status   MRSA by PCR POSITIVE (*) NEGATIVE Final   Comment:            The GeneXpert MRSA Assay (FDA     approved for NASAL specimens     only), is one component of a     comprehensive MRSA colonization     surveillance program. It is not     intended to diagnose MRSA     infection nor to guide or     monitor treatment for     MRSA infections.     RESULT CALLED TO, READ BACK BY AND VERIFIED WITH:     C SORCHA,RN 08/01/13 0448 SHIPMANM     Studies:  Recent x-ray studies have been reviewed in detail by the Attending Physician  Time spent : 35 minutes     Erin Hearing, ANP [facility] Office  [phone] Pager [phone]  **If unable to reach the above provider after paging please contact the [place] @ [phone]  On-Call/Text Page:       [email or web address]      password [email or web address]  If 7PM-7AM, please contact night-coverage [email or web address] Password [email or web address] 08/03/2013, 6:58 PM   LOS: 3 days   I have personally examined this patient and reviewed the entire database. I have reviewed the above note, made any necessary editorial changes, and agree with its content.  Cherene Altes, MD [facility]

## 2013-08-04 LAB — BASIC METABOLIC PANEL
BUN: 11 mg/dL (ref 6–23)
BUN: 8 mg/dL (ref 6–23)
CALCIUM: 8.2 mg/dL — AB (ref 8.4–10.5)
CO2: 19 mEq/L (ref 19–32)
CO2: 21 mEq/L (ref 19–32)
Calcium: 8 mg/dL — ABNORMAL LOW (ref 8.4–10.5)
Chloride: 103 mEq/L (ref 96–112)
Chloride: 107 mEq/L (ref 96–112)
Creatinine, Ser: 0.57 mg/dL (ref 0.50–1.10)
Creatinine, Ser: 0.56 mg/dL (ref 0.50–1.10)
GFR calc Af Amer: >90 mL/min (ref >90)
GLUCOSE: 129 mg/dL — AB (ref 70–99)
Glucose, Bld: 217 mg/dL — ABNORMAL HIGH (ref 70–99)
POTASSIUM: 3.1 meq/L — AB (ref 3.7–5.3)
Potassium: 3.1 mEq/L — ABNORMAL LOW (ref 3.7–5.3)
Sodium: 136 mEq/L — ABNORMAL LOW (ref 137–147)
Sodium: 139 mEq/L (ref 137–147)

## 2013-08-04 LAB — GLUCOSE, CAPILLARY
GLUCOSE-CAPILLARY: 129 mg/dL — AB (ref 70–99)
GLUCOSE-CAPILLARY: 145 mg/dL — AB (ref 70–99)
GLUCOSE-CAPILLARY: 153 mg/dL — AB (ref 70–99)
GLUCOSE-CAPILLARY: 174 mg/dL — AB (ref 70–99)
GLUCOSE-CAPILLARY: 212 mg/dL — AB (ref 70–99)
GLUCOSE-CAPILLARY: 77 mg/dL (ref 70–99)
Glucose-Capillary: 134 mg/dL — ABNORMAL HIGH (ref 70–99)
Glucose-Capillary: 156 mg/dL — ABNORMAL HIGH (ref 70–99)
Glucose-Capillary: 166 mg/dL — ABNORMAL HIGH (ref 70–99)
Glucose-Capillary: 250 mg/dL — ABNORMAL HIGH (ref 70–99)
Glucose-Capillary: 228 mg/dL — ABNORMAL HIGH (ref 70–99)
Glucose-Capillary: 39 mg/dL — CL (ref 70–99)
Glucose-Capillary: 83 mg/dL (ref 70–99)
Glucose-Capillary: 93 mg/dL (ref 70–99)
Glucose-Capillary: 52 mg/dL — ABNORMAL LOW (ref 70–99)

## 2013-08-04 MED ORDER — GLUCOSE 40 % PO GEL: 1.0000 | ORAL | Status: DC | PRN

## 2013-08-04 MED ORDER — DEXTROSE 50 % IV SOLN: 25.0000 mL | INTRAVENOUS | Status: DC | PRN

## 2013-08-04 MED ORDER — POTASSIUM CHLORIDE CRYS ER 20 MEQ PO TBCR: 20.0000 meq | EXTENDED_RELEASE_TABLET | ORAL | Status: DC

## 2013-08-04 MED ORDER — INSULIN ASPART PROT & ASPART (70-30 MIX) 100 UNIT/ML ~~LOC~~ SUSP
28.0000 [IU] | Freq: Every day | SUBCUTANEOUS | Status: DC
  Administered 2013-08-04: 28 [IU] via SUBCUTANEOUS

## 2013-08-04 MED ORDER — INSULIN ASPART PROT & ASPART (70-30 MIX) 100 UNIT/ML ~~LOC~~ SUSP: 36.0000 [IU] | Freq: Every day | SUBCUTANEOUS | Status: DC

## 2013-08-04 MED ORDER — INSULIN GLARGINE 100 UNIT/ML ~~LOC~~ SOLN
20.0000 [IU] | Freq: Every day | SUBCUTANEOUS | Status: DC
  Filled 2013-08-04: qty 0.2

## 2013-08-04 MED ORDER — INSULIN NPH (HUMAN) (ISOPHANE) 100 UNIT/ML ~~LOC~~ SUSP
15.0000 [IU] | Freq: Once | SUBCUTANEOUS | Status: AC
  Administered 2013-08-04: 15 [IU] via SUBCUTANEOUS
  Filled 2013-08-04 (×3): qty 10

## 2013-08-04 MED ORDER — DEXTROSE 50 % IV SOLN: 50.0000 mL | INTRAVENOUS | Status: DC | PRN

## 2013-08-04 MED ORDER — INSULIN ASPART PROT & ASPART (70-30 MIX) 100 UNIT/ML ~~LOC~~ SUSP: 36.0000 [IU] | Freq: Two times a day (BID) | SUBCUTANEOUS | Status: DC

## 2013-08-04 MED ORDER — METFORMIN HCL 500 MG PO TABS
500.0000 mg | ORAL_TABLET | Freq: Two times a day (BID) | ORAL | Status: DC
  Administered 2013-08-04 – 2013-08-06 (×5): 500 mg via ORAL
  Filled 2013-08-04 (×7): qty 1

## 2013-08-04 MED ORDER — INSULIN ASPART PROT & ASPART (70-30 MIX) 100 UNIT/ML ~~LOC~~ SUSP
75.0000 [IU] | Freq: Every day | SUBCUTANEOUS | Status: DC
  Administered 2013-08-05: 75 [IU] via SUBCUTANEOUS

## 2013-08-04 MED ORDER — POTASSIUM CHLORIDE CRYS ER 20 MEQ PO TBCR
40.0000 meq | EXTENDED_RELEASE_TABLET | Freq: Once | ORAL | Status: AC
  Administered 2013-08-04: 40 meq via ORAL
  Filled 2013-08-04: qty 2

## 2013-08-04 MED ORDER — INSULIN ASPART 100 UNIT/ML ~~LOC~~ SOLN: 0.0000 [IU] | Freq: Every day | SUBCUTANEOUS | Status: DC

## 2013-08-04 MED ORDER — INSULIN ASPART PROT & ASPART (70-30 MIX) 100 UNIT/ML ~~LOC~~ SUSP
86.0000 [IU] | Freq: Every day | SUBCUTANEOUS | Status: DC
  Administered 2013-08-04: 86 [IU] via SUBCUTANEOUS
  Filled 2013-08-04 (×4): qty 10

## 2013-08-04 MED ORDER — INSULIN GLARGINE 100 UNIT/ML ~~LOC~~ SOLN
20.0000 [IU] | Freq: Every day | SUBCUTANEOUS | Status: DC
  Administered 2013-08-04: 20 [IU] via SUBCUTANEOUS
  Filled 2013-08-04: qty 0.2

## 2013-08-04 MED ORDER — INSULIN ASPART 100 UNIT/ML ~~LOC~~ SOLN
0.0000 [IU] | Freq: Three times a day (TID) | SUBCUTANEOUS | Status: DC
  Administered 2013-08-04 (×2): 5 [IU] via SUBCUTANEOUS

## 2013-08-04 NOTE — Plan of Care (Cosign Needed)
K 3.1 so Kdur x 1 dose ordered.   Erin Hearing, ANP

## 2013-08-04 NOTE — Progress Notes (Signed)
Inpatient Diabetes Program Recommendations  AACE/ADA: New Consensus Statement on Inpatient Glycemic Control (2013)  Target Ranges:  Prepandial:   less than 140 mg/dL      Peak postprandial:   less than 180 mg/dL (1-2 hours)      Critically ill patients:  140 - 180 mg/dL   This coordinator met with patient to answer questions.  Patient is interested in an insulin pump therefore recommend seeing an Endocrinologist to discuss if she is a candidate. Also, patient has financial constraints that limit the insulin she is prescribed.  She has difficulty affording her insulin therefore recommend [facility] ReliOn Novolin 70/30 at discharge.  It is approximately $25 a vial at [facility]  No further questions/concerns.  Patient is interested in attending DM classes at [facility]-order entered. Thank you  Raoul Pitch BSN, RN,CDE Inpatient Diabetes Coordinator [phone] (team pager)

## 2013-08-04 NOTE — Plan of Care (Addendum)
Problem: Limited Adherence to Nutrition-Related Recommendations (NB-1.6) Goal: Nutrition education Formal process to instruct or train a patient/client in a skill or to impart knowledge to help patients/clients voluntarily manage or modify food choices and eating behavior to maintain or improve health. Outcome: Completed/Met Date Met:  08/04/13  RD consulted for nutrition education regarding diabetes.     Lab Results  Component Value Date    HGBA1C 11.5* 04/02/2013    RD provided "Carbohydrate Counting for People with Diabetes" handout from the Academy of Nutrition and Dietetics. Discussed different food groups and their effects on blood sugar, emphasizing carbohydrate-containing foods. Provided list of carbohydrates and recommended serving sizes of common foods.  Discussed importance of controlled and consistent carbohydrate intake throughout the day. Provided examples of ways to balance meals/snacks and encouraged intake of high-fiber, whole grain complex carbohydrates. Teach back method used.  Expect fair compliance.  Body mass index is 37.52 kg/(m^2). Pt meets criteria for Obesity Class II based on current BMI.  Current diet order is Carbohydrate Modified Medium Calorie, patient is consuming approximately 100% of meals at this time. Labs and medications reviewed. No further nutrition interventions warranted at this time.  If additional nutrition issues arise, please re-consult RD.  Patient with questions regarding DM medication regimen, how IV insulin works.  RD contacted Diabetes Coordinator for consultation.   Arthur Holms, RD, LDN Pager #: [phone] After-Hours Pager #: [phone]

## 2013-08-04 NOTE — Progress Notes (Signed)
Patient noted to be hypoglycemic but asymptomatic. Received 5 U Novolog with lunch and 15 U NPH this AM. Will cut back on home insulin doses and d/c sliding scales.   Debbe Odea, MD

## 2013-08-04 NOTE — Progress Notes (Signed)
Hypoglycemic Event  CBG: 40 Treatment: 15 GM carbohydrate snack  Symptoms: None  Follow-up CBG: Time:40 CBG Result:83  Possible Reasons for Event: Medication regimen: inadequate insulin  Comments/MD notified:Dr. Delight Stare, Tyrion Glaude L  Remember to initiate Hypoglycemia Order Set & complete

## 2013-08-04 NOTE — Progress Notes (Signed)
[facility] [place] TEAM 1 - Stepdown/ICU TEAM  Kristie [name] HDQ:[id number] DOB: 09/06/1965 DOA: 07/31/2013 PCP: Tammi Sou, MD  Brief narrative: 48 year old female patient with non-insulin-dependent diabetes, hypertension and dyslipidemia. [place] [facility] with severe DKA. Her glucose at presentation was 495 with white cells 25,600. Her anion gap was 37. She was admitted to the ICU where she was started on a insulin drip. On 08/02/2013 she was transitioned from insulin infusion to subcutaneous insulin with sliding scale. She was deemed appropriate to transfer to step down.  Assessment/Plan:    DKA (diabetic ketoacidoses) -gap closed and given Lantus last night although she takes 70/30 at home -h/o gestational of diabetes  -Patient has had difficulty with labile sugars most recently did not do well on Lantus nor with the addition of Victoza (significant rapid weight loss and weakness with near syncope) and therefore changed to 70/30 insulin and initially did well but had noticed increasing difficulties with hyperglycemia -PCP stopped Metformin when 70/30 started but we have resumed it for insulin resistance -pt endorsed only eats one meal daily at lunch- d/w this not appropriate for diabetic esp if on mixed insulin- will ask Nutrition to see -OP Diabetes Clinic follow up for continued education -Patient would like to see if appropriate for an insulin pump -Previously saw endocrinologist with [facility] and she will need to return to endocrinologist     HYPERTENSION -Not on medications at home   Hypokalemia -oral replete    ADD (attention deficit disorder)/depression/anxiety -on Effexor and Klonopin    HYPERLIPIDEMIA -Apparently not on medications prior to admission    ANEMIA, IRON DEFICIENCY -Baseline hemoglobin once hydrated between 9.5 and 10.2   DVT prophylaxis: Subcutaneous heparin Code Status: Full Family Communication:  Patient's mother at bedside Disposition Plan/Expected LOS: Transfer to Floor  Consultants: None  Procedures: None  Antibiotics: None  HPI/Subjective: Patient alert and endorsed significant improvement in sx's-OK to resume Metformin since no major side effects  Objective: Blood pressure 109/66, pulse 109, temperature 97.9 F (36.6 C), temperature source Oral, resp. rate 27, height 5\' 7"  (1.702 m), weight 239 lb 10.2 oz (108.7 kg), last menstrual period 07/16/2013, SpO2 97.00%.  Intake/Output Summary (Last 24 hours) at 08/04/13 1250 Last data filed at 08/04/13 0900  Gross per 24 hour  Intake 877.69 ml  Output   2601 ml  Net -1723.31 ml   Exam: General: No acute respiratory distress Lungs: Clear to auscultation bilaterally without wheezes or crackles, RA Cardiovascular: Regular rate and rhythm without murmur gallop or rub normal S1 and S2, no peripheral edema or JVD Abdomen: Nontender, nondistended, soft, bowel sounds positive, no rebound, no ascites, no appreciable mass Musculoskeletal: No significant cyanosis, clubbing of bilateral lower extremities Neurological: Alert and oriented x 3, moves all extremities x 4 without focal neurological deficits, CN 2-12 intact  Scheduled Meds:  Scheduled Meds: . Chlorhexidine Gluconate Cloth  6 each Topical Q0600  . heparin  5,000 Units Subcutaneous Q8H  . insulin aspart  0-15 Units Subcutaneous TID WC  . insulin aspart  0-5 Units Subcutaneous QHS  . insulin aspart protamine- aspart  36 Units Subcutaneous Q supper  . insulin aspart protamine- aspart  86 Units Subcutaneous Q breakfast  . metFORMIN  500 mg Oral BID WC  . mupirocin ointment  1 application Nasal BID  . venlafaxine XR  300 mg Oral Daily   Continuous Infusions:   Data Reviewed: Basic Metabolic Panel:  Recent Labs Lab 08/02/13 0230 08/02/13 0522  08/03/13 0605 08/03/13 0944 08/03/13 1344 08/03/13 1820 08/03/13 2230 08/04/13 0338  NA 135* 139  < > 136* 136* 137  137 136* 139  K 3.3* 3.0*  < > 3.7 3.7 3.4* 3.7 3.1* 3.1*  CL 109 111  < > 104 102 104 104 103 107  CO2 12* 13*  < > 14* 16* 19 20 19 21   GLUCOSE 191* 129*  < > 303* 352* 170* 133* 217* 129*  BUN 5* 5*  < > 7 8 10 10 11 8   CREATININE 0.67 0.68  < > 0.70 0.69 0.68 0.59 0.57 0.56  CALCIUM 7.4* 7.7*  < > 8.0* 8.0* 8.2* 8.3* 8.0* 8.2*  MG  --  1.6  --  2.1  --   --   --   --   --   PHOS  --  <0.5*  --  1.6*  --   --   --   --   --   < > = values in this interval not displayed. Liver Function Tests:  Recent Labs Lab 07/31/13 2352 08/01/13 0226 08/02/13 0522  AST 10 8 8   ALT 30 28 16   ALKPHOS 190* 178* 123*  BILITOT 0.3 0.3 0.9  PROT 7.7 7.4 5.5*  ALBUMIN 3.6 3.4* 2.5*   CBC:  Recent Labs Lab 07/31/13 1758 07/31/13 2143 08/02/13 0522 08/03/13 1040  WBC 25.6* 29.8* 9.8 8.0  NEUTROABS 21.2*  --   --   --   HGB 13.7 12.3 10.2* 9.5*  HCT 44.6 39.3 30.5* 28.5*  MCV 79.5 79.7 72.8* 73.1*  PLT 507* 432* 282 253   CBG:  Recent Labs Lab 08/04/13 0413 08/04/13 0518 08/04/13 0622 08/04/13 0737 08/04/13 1243  GLUCAP 129* 156* 166* 250* 228*    Recent Results (from the past 240 hour(s))  MRSA PCR SCREENING     Status: Abnormal   Collection Time    07/31/13  9:23 PM      Result Value Range Status   MRSA by PCR POSITIVE (*) NEGATIVE Final   Comment:            The GeneXpert MRSA Assay (FDA     approved for NASAL specimens     only), is one component of a     comprehensive MRSA colonization     surveillance program. It is not     intended to diagnose MRSA     infection nor to guide or     monitor treatment for     MRSA infections.     RESULT CALLED TO, READ BACK BY AND VERIFIED WITH:     C SORCHA,RN 08/01/13 0448 SHIPMANM     Studies:  Recent x-ray studies have been reviewed in detail by the Attending Physician  Time spent : 20 minutes     Erin Hearing, ANP [facility] Office  [phone] Pager [phone]  **If unable to reach the above  provider after paging please contact the [place] @ [phone]  On-Call/Text Page:      [email or web address]      password [email or web address]  If 7PM-7AM, please contact night-coverage [email or web address] Password [facility] 08/04/2013, 12:50 PM   LOS: 4 days    I have examined the patient, reviewed the chart and modified the above note which I agree with.   Sivan Cuello,MD [phone] 08/04/2013, 5:26 PM

## 2013-08-05 LAB — BASIC METABOLIC PANEL
BUN: 12 mg/dL (ref 6–23)
CHLORIDE: 107 meq/L (ref 96–112)
CO2: 22 mEq/L (ref 19–32)
Calcium: 8 mg/dL — ABNORMAL LOW (ref 8.4–10.5)
Creatinine, Ser: 0.67 mg/dL (ref 0.50–1.10)
Glucose, Bld: 70 mg/dL (ref 70–99)
POTASSIUM: 3.4 meq/L — AB (ref 3.7–5.3)
Sodium: 141 mEq/L (ref 137–147)

## 2013-08-05 LAB — GLUCOSE, CAPILLARY
GLUCOSE-CAPILLARY: 154 mg/dL — AB (ref 70–99)
GLUCOSE-CAPILLARY: 58 mg/dL — AB (ref 70–99)
GLUCOSE-CAPILLARY: 60 mg/dL — AB (ref 70–99)
Glucose-Capillary: 81 mg/dL (ref 70–99)
Glucose-Capillary: 59 mg/dL — ABNORMAL LOW (ref 70–99)
Glucose-Capillary: 194 mg/dL — ABNORMAL HIGH (ref 70–99)
Glucose-Capillary: 112 mg/dL — ABNORMAL HIGH (ref 70–99)
Glucose-Capillary: 77 mg/dL (ref 70–99)

## 2013-08-05 LAB — HEMOGLOBIN A1C
Hgb A1c MFr Bld: 10.4 % — ABNORMAL HIGH (ref <5.7)
MEAN PLASMA GLUCOSE: 252 mg/dL — AB (ref <117)

## 2013-08-05 MED ORDER — INSULIN ASPART PROT & ASPART (70-30 MIX) 100 UNIT/ML ~~LOC~~ SUSP
35.0000 [IU] | Freq: Every day | SUBCUTANEOUS | Status: DC
  Administered 2013-08-06: 35 [IU] via SUBCUTANEOUS
  Filled 2013-08-05: qty 10

## 2013-08-05 MED ORDER — INSULIN ASPART 100 UNIT/ML ~~LOC~~ SOLN
0.0000 [IU] | Freq: Three times a day (TID) | SUBCUTANEOUS | Status: DC
Start: 2013-08-05 — End: 2013-08-05
  Administered 2013-08-05: 4 [IU] via SUBCUTANEOUS

## 2013-08-05 MED ORDER — INSULIN ASPART PROT & ASPART (70-30 MIX) 100 UNIT/ML ~~LOC~~ SUSP: 14.0000 [IU] | Freq: Every day | SUBCUTANEOUS | Status: DC

## 2013-08-05 NOTE — Progress Notes (Signed)
Attempt to call report to unit 5W nurse is unable to take report currently in isolation room starting IV. Nurse to return call for report

## 2013-08-05 NOTE — Progress Notes (Signed)
Patient to be transferred to 5W.Currently treating for hypoglycemic event

## 2013-08-05 NOTE — Progress Notes (Signed)
Report called to RN on unit 5W. Peripheral iv HAS BEEN INSERTED PER IV TEAM. I WILL D/C CENTRAL LINEN  PRIOR TO TRANSFER NURSE CAN NOT D/C LINE ON 5w

## 2013-08-05 NOTE — Progress Notes (Addendum)
[facility] [place] TEAM 1 - Stepdown/ICU TEAM  Kristie [name] QQI:[id number] DOB: October 03, 1965 DOA: 07/31/2013 PCP: Tammi Sou, MD  Brief narrative: 48 year old female patient with non-insulin-dependent diabetes, hypertension and dyslipidemia. Transferred from [facility] with severe DKA. Her glucose at presentation was 495. Her anion gap was 37. She was admitted to the ICU where she was started on an insulin drip. On 08/02/2013 she was transitioned from insulin infusion to subcutaneous insulin with sliding scale. She was deemed appropriate to transfer to step down.  Assessment/Plan:  DKA in severely uncontrolled DM  -was given Lantus 1/19 pm (although she takes 70/30 at home) -Patient has had difficulty with labile sugars - most recently "did not do well" on Lantus as OP nor with the addition of Victoza (significant rapid weight loss and weakness with near syncope) and therefore changed to 70/30 insulin and initially did well but had noticed increasing difficulties with hyperglycemia -PCP stopped Metformin when 70/30 started but we have resumed it for insulin resistance -pt endorsed only eats one meal daily at lunch - d/w this not appropriate for diabetic esp if on mixed insulin - Nutrition following - pt encouraged about future management of diabetes -needs script for Relion brand 70/30 at dc -OP Diabetes Clinic follow up for continued education -Patient would like to see if appropriate for an insulin pump -Previously saw endocrinologist with [facility] and she will need to return to endocrinologist after d/c  - CBG proving to be quite labile, now w/ recurring episodes of hypoglycemia - transfer to med bed but keep inpt until CBG stable enough to allow d/c home  HYPERTENSION -Not on medications at home -BP now controlled  Hypokalemia -oral replete  ADD/depression/anxiety -on Effexor and Klonopin  HYPERLIPIDEMIA -Apparently not on medications  prior to admission -given poorly controlled diabetes will re check here in event may need meds  ANEMIA, IRON DEFICIENCY -Baseline hemoglobin once hydrated between 9.5 and 10.2  DVT prophylaxis: Subcutaneous heparin Code Status: Full Family Communication: no family present at time of exam today  Disposition Plan/Expected LOS: Transfer to [place] on Team 1  Consultants: None  Procedures: None  Antibiotics: None  HPI/Subjective: Patient alert and endorsed better understanding of disease process and nutritional requirements. No other complaints verbalized.  Objective: Blood pressure 88/55, pulse 82, temperature 98 F (36.7 C), temperature source Oral, resp. rate 13, height 5\' 7"  (1.702 m), weight 239 lb 10.2 oz (108.7 kg), last menstrual period 07/16/2013, SpO2 97.00%.  Intake/Output Summary (Last 24 hours) at 08/05/13 1107 Last data filed at 08/04/13 1432  Gross per 24 hour  Intake    240 ml  Output      0 ml  Net    240 ml   Exam: General: No acute respiratory distress Lungs: Clear to auscultation bilaterally without wheezes or crackles Cardiovascular: Regular rate and rhythm without murmur gallop or rub normal S1 and S2, no peripheral edema  Abdomen: Nontender, nondistended, soft, bowel sounds positive, no rebound, no ascites, no appreciable mass Musculoskeletal: No significant cyanosis, clubbing of bilateral lower extremities Neurological: Alert and oriented x 3, moves all extremities x 4 without focal neurological deficits  Scheduled Meds:  Scheduled Meds: . heparin  5,000 Units Subcutaneous Q8H  . insulin aspart  0-20 Units Subcutaneous TID WC  . insulin aspart protamine- aspart  28 Units Subcutaneous Q supper  . insulin aspart protamine- aspart  75 Units Subcutaneous Q breakfast  . metFORMIN  500 mg Oral BID WC  .  mupirocin ointment  1 application Nasal BID  . venlafaxine XR  300 mg Oral Daily     Data Reviewed: Basic Metabolic Panel:  Recent Labs Lab  08/02/13 0230 08/02/13 0522  08/03/13 0605  08/03/13 1344 08/03/13 1820 08/03/13 2230 08/04/13 0338 08/05/13 0256  NA 135* 139  < > 136*  < > 137 137 136* 139 141  K 3.3* 3.0*  < > 3.7  < > 3.4* 3.7 3.1* 3.1* 3.4*  CL 109 111  < > 104  < > 104 104 103 107 107  CO2 12* 13*  < > 14*  < > 19 20 19 21 22   GLUCOSE 191* 129*  < > 303*  < > 170* 133* 217* 129* 70  BUN 5* 5*  < > 7  < > 10 10 11 8 12   CREATININE 0.67 0.68  < > 0.70  < > 0.68 0.59 0.57 0.56 0.67  CALCIUM 7.4* 7.7*  < > 8.0*  < > 8.2* 8.3* 8.0* 8.2* 8.0*  MG  --  1.6  --  2.1  --   --   --   --   --   --   PHOS  --  <0.5*  --  1.6*  --   --   --   --   --   --   < > = values in this interval not displayed. Liver Function Tests:  Recent Labs Lab 07/31/13 2352 08/01/13 0226 08/02/13 0522  AST 10 8 8   ALT 30 28 16   ALKPHOS 190* 178* 123*  BILITOT 0.3 0.3 0.9  PROT 7.7 7.4 5.5*  ALBUMIN 3.6 3.4* 2.5*   CBC:  Recent Labs Lab 07/31/13 1758 07/31/13 2143 08/02/13 0522 08/03/13 1040  WBC 25.6* 29.8* 9.8 8.0  NEUTROABS 21.2*  --   --   --   HGB 13.7 12.3 10.2* 9.5*  HCT 44.6 39.3 30.5* 28.5*  MCV 79.5 79.7 72.8* 73.1*  PLT 507* 432* 282 253   CBG:  Recent Labs Lab 08/04/13 2155 08/04/13 2242 08/05/13 0050 08/05/13 0806 08/05/13 0931  GLUCAP 52* 77 81 59* 194*    Recent Results (from the past 240 hour(s))  MRSA PCR SCREENING     Status: Abnormal   Collection Time    07/31/13  9:23 PM      Result Value Range Status   MRSA by PCR POSITIVE (*) NEGATIVE Final   Comment:            The GeneXpert MRSA Assay (FDA     approved for NASAL specimens     only), is one component of a     comprehensive MRSA colonization     surveillance program. It is not     intended to diagnose MRSA     infection nor to guide or     monitor treatment for     MRSA infections.     RESULT CALLED TO, READ BACK BY AND VERIFIED WITH:     C SORCHA,RN 08/01/13 0448 SHIPMANM     Studies:  Recent x-ray studies have been  reviewed in detail by the Attending Physician  Time spent : >35 minutes     Erin Hearing, ANP [facility] Office  [phone] Pager [phone]  **If unable to reach the above provider after paging please contact the [place] @ [phone]  On-Call/Text Page:      [email or web address]      password [email or web address]  If 7PM-7AM, please contact night-coverage [email or web address] Password  [email or web address] 08/05/2013, 11:07 AM   LOS: 5 days    I have personally examined this patient and reviewed the entire database. I have reviewed the above note, made any necessary editorial changes, and agree with its content.  Cherene Altes, MD [facility]

## 2013-08-05 NOTE — Progress Notes (Signed)
Hypoglycemic Event  CBG: 58 Treatment: 15 GM carbohydrate snack  Symptoms: None  Follow-up CBG: Time:1740CBG Result:112  Possible Reasons for Event: Medication regimen: unknown  Comments/MD notifiedDr. Era Skeen, Amery Minasyan L  Remember to initiate Hypoglycemia Order Set & complete

## 2013-08-06 DIAGNOSIS — F3289 Other specified depressive episodes: Secondary | ICD-10-CM

## 2013-08-06 DIAGNOSIS — F329 Major depressive disorder, single episode, unspecified: Secondary | ICD-10-CM

## 2013-08-06 DIAGNOSIS — F988 Other specified behavioral and emotional disorders with onset usually occurring in childhood and adolescence: Secondary | ICD-10-CM

## 2013-08-06 LAB — LIPID PANEL
CHOL/HDL RATIO: 4.7 ratio
CHOLESTEROL: 173 mg/dL (ref 0–200)
HDL: 37 mg/dL — ABNORMAL LOW (ref >39)
LDL CALC: 102 mg/dL — AB (ref 0–99)
Triglycerides: 169 mg/dL — ABNORMAL HIGH (ref <150)
VLDL: 34 mg/dL (ref 0–40)

## 2013-08-06 LAB — BASIC METABOLIC PANEL
BUN: 13 mg/dL (ref 6–23)
CALCIUM: 8.3 mg/dL — AB (ref 8.4–10.5)
CO2: 26 meq/L (ref 19–32)
CREATININE: 0.72 mg/dL (ref 0.50–1.10)
Chloride: 105 mEq/L (ref 96–112)
GFR calc Af Amer: >90 mL/min (ref >90)
GLUCOSE: 144 mg/dL — AB (ref 70–99)
Potassium: 3.7 mEq/L (ref 3.7–5.3)
Sodium: 142 mEq/L (ref 137–147)

## 2013-08-06 LAB — GLUCOSE, CAPILLARY
GLUCOSE-CAPILLARY: 247 mg/dL — AB (ref 70–99)
GLUCOSE-CAPILLARY: 211 mg/dL — AB (ref 70–99)
Glucose-Capillary: 40 mg/dL — CL (ref 70–99)
Glucose-Capillary: 140 mg/dL — ABNORMAL HIGH (ref 70–99)

## 2013-08-06 MED ORDER — INSULIN NPH ISOPHANE & REGULAR (70-30) 100 UNIT/ML ~~LOC~~ SUSP: 35.0000 [IU] | Freq: Every day | SUBCUTANEOUS | Status: DC

## 2013-08-06 MED ORDER — INSULIN ASPART PROT & ASPART (70-30 MIX) 100 UNIT/ML ~~LOC~~ SUSP
18.0000 [IU] | Freq: Every day | SUBCUTANEOUS | Status: DC
  Administered 2013-08-06: 18 [IU] via SUBCUTANEOUS

## 2013-08-06 MED ORDER — INSULIN NPH ISOPHANE & REGULAR (70-30) 100 UNIT/ML ~~LOC~~ SUSP: 18.0000 [IU] | Freq: Every day | SUBCUTANEOUS | Status: DC

## 2013-08-06 MED ORDER — METFORMIN HCL 500 MG PO TABS: 500.0000 mg | ORAL_TABLET | Freq: Two times a day (BID) | ORAL | Status: DC

## 2013-08-06 MED ORDER — INSULIN ASPART PROT & ASPART (70-30 MIX) 100 UNIT/ML ~~LOC~~ SUSP: 16.0000 [IU] | Freq: Every day | SUBCUTANEOUS | Status: DC

## 2013-08-06 NOTE — Progress Notes (Signed)
Pt admitted to the unit. Pt is alert and oriented. Pt oriented to room, staff, and call bell. Bed in lowest position. Full assessment to Epic. Call bell with in reach. Told to call for assists. Will continue to monitor.  Bennetta Rudden E  

## 2013-08-06 NOTE — Discharge Summary (Signed)
Physician Discharge Summary  Kristie [name] WUJ:[id number] DOB: [birth year] DOA: 07/31/2013  PCP: Tammi Sou, MD  Admit date: 07/31/2013 Discharge date: 08/06/2013  Time spent: >30 minutes  Recommendations for Outpatient Follow-up:  1. Followup with primary care provider within one week of discharge 2. Followup with [place] diabetes outpatient clinic as scheduled 3. It is recommended to reestablish with your previous endocrinologist at [facility]  Discharge Diagnoses:  Active Problems:   DKA (diabetic ketoacidoses)-resolved   HYPERTENSION   ADD (attention deficit disorder)   HYPERLIPIDEMIA   ANEMIA, IRON DEFICIENCY   Discharge Condition: stable  Diet recommendation: Carbohydrate modified  Filed Weights   08/05/13 0400 08/05/13 2100 08/06/13 0535  Weight: 239 lb 10.2 oz (108.7 kg) 236 lb 3.2 oz (107.14 kg) 234 lb 14.4 oz (106.55 kg)    History of present illness:  48 year old female patient with non-insulin-dependent diabetes, hypertension and dyslipidemia. Transferred from [facility] with severe DKA. Her glucose at presentation was 495. Her anion gap was 37. She was admitted to the ICU where she was started on an insulin drip. On 08/02/2013 she was transitioned from insulin infusion to subcutaneous insulin with sliding scale. She was deemed appropriate to transfer to step down.   Hospital Course:  DKA in severely uncontrolled DM  -Required insulin drip for ~48 hours due to recurrence of AG acidosis -Patient has had difficulty with labile sugars at home - most recently "did not do well" on Lantus as OP nor with the addition of Victoza (significant rapid weight loss and weakness with near syncope) and therefore changed to 70/30 insulin and initially did well but had noticed increasing difficulties with hyperglycemia  -PCP stopped Metformin when 70/30 started but we have resumed it for insulin resistance  -pt endorsed only ate one meal daily  at lunch prior to admission - discussed with her this not appropriate for diabetic esp if on mixed insulin - Nutrition following  -OP Diabetes Clinic follow up for continued education has been scheduled -Previously saw endocrinologist with [place] and she will need to return to endocrinologist after d/c  -HgbA1c =10.4  HYPERTENSION  -Not on medications at home  -BP now controlled   Hypokalemia  -orally repleted   ADD/depression/anxiety  -on Effexor and Klonopin   HYPERLIPIDEMIA  -Apparently not on medications prior to admission  -given poorly controlled diabetes will re check here in event may need meds  -TG 169, HDL 39 and LDL 102 so suspect will need statin after dc- defer to PCP  ANEMIA, IRON DEFICIENCY  -Baseline hemoglobin once hydrated between 9.5 and 10.2   Procedures:  None  Consultations:  None  Discharge Exam: Filed Vitals:   08/06/13 0641  BP: 117/76  Pulse:   Temp:   Resp:    General: No acute respiratory distress  Lungs: Clear to auscultation bilaterally without wheezes or crackles  Cardiovascular: Regular rate and rhythm without murmur gallop or rub normal S1 and S2, no peripheral edema  Abdomen: Nontender, nondistended, soft, bowel sounds positive, no rebound, no ascites, no appreciable mass  Musculoskeletal: No significant cyanosis, clubbing of bilateral lower extremities  Neurological: Alert and oriented x 3, moves all extremities x 4 without focal neurological deficits    Discharge Instructions      Discharge Orders   Future Appointments Provider Department Dept Phone   09/15/2013 5:30 PM [facility] Core Class 1 [facility] [phone]   09/22/2013 5:30 PM [facility] Core  Class 2 [facility] [phone]   09/29/2013 5:30 PM [facility] Core Class 3 [facility] [phone]   Future Orders Complete By  Expires   Ambulatory referral to Nutrition and Diabetic Education  As directed        Medication List    STOP taking these medications       insulin aspart protamine- aspart (70-30) 100 UNIT/ML injection  Commonly known as:  NOVOLOG MIX 70/30      TAKE these medications       clonazePAM 1 MG tablet  Commonly known as:  KLONOPIN  Take 1-2 mg by mouth 3 (three) times daily. 1 tab a.m. 1 tab at 2p and 2 tabs at bedtime     EPINEPHrine 0.3 mg/0.3 mL Devi  Commonly known as:  EPI-PEN  Inject 0.3mg  into thigh muscle once in case of severe allergic reaction, then proceed to nearest ER.     insulin NPH-regular Human (70-30) 100 UNIT/ML injection  Commonly known as:  NOVOLIN 70/30  Inject 18 Units into the skin at bedtime.     insulin NPH-regular Human (70-30) 100 UNIT/ML injection  Commonly known as:  NOVOLIN 70/30  Inject 35 Units into the skin daily with breakfast.     metFORMIN 500 MG tablet  Commonly known as:  GLUCOPHAGE  Take 1 tablet (500 mg total) by mouth 2 (two) times daily with a meal.     venlafaxine XR 150 MG 24 hr capsule  Commonly known as:  EFFEXOR-XR  Take 2 capsules (300 mg total) by mouth daily.       No Known Allergies Follow-up Information   Follow up with MCGOWEN,PHILIP H, MD. Schedule an appointment as soon as possible for a visit in 1 week.   Specialty:  Family Medicine   Contact information:   [address] [place] [address] [phone]       Follow up with [facility]. (tHEY WILL CALL WITH APPOINTMENT TIME)        The results of significant diagnostics from this hospitalization (including imaging, microbiology, ancillary and laboratory) are listed below for reference.    Significant Diagnostic Studies: Dg Chest Port 1 View  08/02/2013   CLINICAL DATA:  Diabetic ketoacidosis  EXAM: PORTABLE CHEST - 1 VIEW  COMPARISON:  07/31/2013  FINDINGS: No change position of left central line. Heart  size and vascular pattern unchanged in within normal limits. Lungs clear.  IMPRESSION: No active disease.   Electronically Signed   By: Skipper Cliche M.D.   On: 08/02/2013 08:11   Dg Chest Port 1 View  08/01/2013   CLINICAL DATA:  Central venous line placement  EXAM: PORTABLE CHEST - 1 VIEW  COMPARISON:  07/31/2013 at 1709 hr  FINDINGS: Lungs are clear. No pleural effusion or pneumothorax.  The heart is normal in size.  Left IJ venous catheter terminates at the cavoatrial junction.  IMPRESSION: Left IJ venous catheter terminates at the cavoatrial junction.  No pneumothorax.   Electronically Signed   By: Julian Hy M.D.   On: 08/01/2013 00:04   Dg Chest Portable 1 View  07/31/2013   CLINICAL DATA:  Emesis.  Shortness of breath.  EXAM: PORTABLE CHEST - 1 VIEW  COMPARISON:  No priors.  FINDINGS: Lung volumes are normal. No consolidative airspace disease. No pleural effusions. No pneumothorax. No pulmonary nodule or mass noted. Pulmonary vasculature and the cardiomediastinal silhouette  are within normal limits. Axial No radiographic evidence of acute cardiopulmonary disease.  IMPRESSION: No active disease.   Electronically Signed   By: Vinnie Langton M.D.   On: 07/31/2013 17:41    Microbiology: Recent Results (from the past 240 hour(s))  MRSA PCR SCREENING     Status: Abnormal   Collection Time    07/31/13  9:23 PM      Result Value Range Status   MRSA by PCR POSITIVE (*) NEGATIVE Final   Comment:            The GeneXpert MRSA Assay (FDA     approved for NASAL specimens     only), is one component of a     comprehensive MRSA colonization     surveillance program. It is not     intended to diagnose MRSA     infection nor to guide or     monitor treatment for     MRSA infections.     RESULT CALLED TO, READ BACK BY AND VERIFIED WITH:     C SORCHA,RN 08/01/13 0448 SHIPMANM     Labs: Basic Metabolic Panel:  Recent Labs Lab 08/02/13 0230 08/02/13 0522  08/03/13 [id number]   08/03/13 1820 08/03/13 2230 08/04/13 0338 08/05/13 0256 08/06/13 0425  NA 135* 139  < > 136*  < > 137 136* 139 141 142  K 3.3* 3.0*  < > 3.7  < > 3.7 3.1* 3.1* 3.4* 3.7  CL 109 111  < > 104  < > 104 103 107 107 105  CO2 12* 13*  < > 14*  < > 20 19 21 22 26   GLUCOSE 191* 129*  < > 303*  < > 133* 217* 129* 70 144*  BUN 5* 5*  < > 7  < > 10 11 8 12 13   CREATININE 0.67 0.68  < > 0.70  < > 0.59 0.57 0.56 0.67 0.72  CALCIUM 7.4* 7.7*  < > 8.0*  < > 8.3* 8.0* 8.2* 8.0* 8.3*  MG  --  1.6  --  2.1  --   --   --   --   --   --   PHOS  --  <0.5*  --  1.6*  --   --   --   --   --   --   < > = values in this interval not displayed. Liver Function Tests:  Recent Labs Lab 07/31/13 2352 08/01/13 0226 08/02/13 0522  AST 10 8 8   ALT 30 28 16   ALKPHOS 190* 178* 123*  BILITOT 0.3 0.3 0.9  PROT 7.7 7.4 5.5*  ALBUMIN 3.6 3.4* 2.5*   No results found for this basename: LIPASE, AMYLASE,  in the last 168 hours No results found for this basename: AMMONIA,  in the last 168 hours CBC:  Recent Labs Lab 07/31/13 1758 07/31/13 2143 08/02/13 0522 08/03/13 1040  WBC 25.6* 29.8* 9.8 8.0  NEUTROABS 21.2*  --   --   --   HGB 13.7 12.3 10.2* 9.5*  HCT 44.6 39.3 30.5* 28.5*  MCV 79.5 79.7 72.8* 73.1*  PLT 507* 432* 282 253   Cardiac Enzymes:  Recent Labs Lab 07/31/13 2331  TROPONINI <0.30   BNP: BNP (last 3 results) No results found for this basename: PROBNP,  in the last 8760 hours CBG:  Recent Labs Lab 08/05/13 1712 08/05/13 1750 08/05/13 2229 08/06/13 0843 08/06/13 1247  GLUCAP 60* 112* 77 247* 140*  Signed:  ELLIS,ALLISON L. ANP  [facility] 08/06/2013, 3:35 PM   I have examined the patient, reviewed the chart and modified the above note which I agree with.   Tressie Ragin,MD [id number] 08/06/2013, 6:18 PM

## 2013-08-06 NOTE — Discharge Instructions (Signed)
Blood Glucose Monitoring, Adult Monitoring your blood glucose (also know as blood sugar) helps you to manage your diabetes. It also helps you and your health care provider monitor your diabetes and determine how well your treatment plan is working. WHY SHOULD YOU MONITOR YOUR BLOOD GLUCOSE?  It can help you understand how food, exercise, and medicine affect your blood glucose.  It allows you to know what your blood glucose is at any given moment. You can quickly tell if you are having low blood glucose (hypoglycemia) or high blood glucose (hyperglycemia).  It can help you and your health care provider know how to adjust your medicines.  It can help you understand how to manage an illness or adjust medicine for exercise. WHEN SHOULD YOU TEST? Your health care provider will help you decide how often you should check your blood glucose. This may depend on the type of diabetes you have, your diabetes control, or the types of medicines you are taking. Be sure to write down all of your blood glucose readings so that this information can be reviewed with your health care provider. See below for examples of testing times that your health care provider may suggest. Type 1 Diabetes  Test 4 times a day if you are in good control, using an insulin pump, or perform multiple daily injections.  If your diabetes is not well-controlled or if you are sick, you may need to monitor more often.  It is a good idea to also monitor:  Before and after exercise.  Between meals and 2 hours after a meal.  Occasionally between 2:00 to 3:00 am. Type 2 Diabetes  It can vary with each person, but generally, if you are on insulin, test 4 times a day.  If you take medicines by mouth (orally), test 2 times a day.  If you are on a controlled diet, test once a day.  If your diabetes is not well controlled or if you are sick, you may need to monitor more often. HOW TO MONITOR YOUR BLOOD GLUCOSE Supplies  Needed  Blood glucose meter.  Test strips for your meter. Each meter has its own strips. You must use the strips that go with your own meter.  A pricking needle (lancet).  A device that holds the lancet (lancing device).  A journal or log book to write down your results. Procedure  Wash your hands with soap and water. Alcohol is not preferred.  Prick the side of your finger (not the tip) with the lancet.  Gently milk the finger until a small drop of blood appears.  Follow the instructions that come with your meter for inserting the test strip, applying blood to the strip, and using your blood glucose meter. Other Areas to Get Blood for Testing Some meters allow you to use other areas of your body (other than your finger) to test your blood. These areas are called alternative sites. The most common alternative sites are:  The forearm.  The thigh.  The back area of the lower leg.  The palm of the hand. The blood flow in these areas is slower. Therefore, the blood glucose values you get may be delayed, and the numbers are different from what you would get from your fingers. Do not use alternative sites if you think you are having hypoglycemia. Your reading will not be accurate. Always use a finger if you are having hypoglycemia. Also, if you cannot feel your lows (hypoglycemia unawareness), always use your fingers for your blood  glucose checks. ADDITIONAL TIPS FOR GLUCOSE MONITORING  Do not reuse lancets.  Always carry your supplies with you.  All blood glucose meters have a 24-hour "hotline" number to call if you have questions or need help.  Adjust (calibrate) your blood glucose meter with a control solution after finishing a few boxes of strips. BLOOD GLUCOSE RECORD KEEPING It is a good idea to keep a daily record or log of your blood glucose readings. Most glucose meters, if not all, keep your glucose records stored in the meter. Some meters come with the ability to download  your records to your home computer. Keeping a record of your blood glucose readings is especially helpful if you are wanting to look for patterns. Make notes to go along with the blood glucose readings because you might forget what happened at that exact time. Keeping good records helps you and your health care provider to work together to achieve good diabetes management.  Document Released: 07/05/2003 Document Revised: 03/04/2013 Document Reviewed: 11/24/2012 [facility] Patient Information 2014 [place].  Correction Insulin Your health care provider has decided you need to take insulin regularly. You have been given a correction scale (also called a sliding scale) in case you need extra insulin when your blood sugar is too high (hyperglycemia). The following instructions will assist you in how to use that correction scale.  WHAT IS A CORRECTION SCALE?  When you check your blood sugar, sometimes it will be higher than your health care provider has told you it should be. You may need an extra dose of insulin to bring your blood sugar to the recommended level (also known as your goal, target, or normal level). The correction scale is prescribed by your health care provider based on your specific needs.  Your correction scale has two parts:   The first shows you a blood sugar range.   The second part tells you how much extra insulin to give yourself if your blood sugar falls within this range. You will not need an extra dose of insulin if your blood glucose is in the desired range. You should simply give yourself the normal amount of insulin that your health care provider has ordered for you.  WHY IS IT IMPORTANT TO KEEP YOUR BLOOD SUGAR LEVELS AT YOUR DESIRED LEVEL?  Keeping your blood sugar at the desired level helps to prevent long-term complications of diabetes, such as eye disease, kidney failure, nerve damage, and other serious complications. WHAT TYPE OF INSULIN WILL YOU USE?  To  help bring down blood sugar levels that are too high, your health care provider will prescribe a short-acting or a rapid-acting insulin. An example of a short-acting insulin would be regular insulin. Remember, you may also have a longer-acting insulin prescribed for you.  WHAT DO YOU NEED TO DO?   Check your blood sugar with your home blood glucose meter as recommended by your health care provider.   Using your correction scale, find the range that your blood sugar lies in.   Look for the units of insulin that match that blood sugar range. Give yourself the dose of correction insulin your health care provider has prescribed. Always make sure you are using the right type of insulin.   Prior to the injection, make sure you have food available that you can eat in the next 15 30 minutes.   If your correction insulin is rapid acting, start eating your meal within 15 minutes after you have given yourself the insulin injection.  If you wait longer than 15 minutes to eat, your blood sugar might get too low.   If your correction insulin is short acting(regular), start eating your meal within 30 minutes after you have given yourself the insulin injection. If you wait longer than 30 minutes to eat, your blood sugar might get too low. Symptoms of low blood sugar (hypoglycemia) may include feeling shaky or weak, sweating, feeling confused, difficulty seeing, agitation, crankiness, or numbness of the lips or tongue. Check your blood sugar immediately and treat your results as directed by your health care provider.   Keep a log of your blood sugar results with the time you took the test and the amount of insulin that you injected. This information will help your health care provider manage your medicines.   Note on your log anything that may affect your blood sugar level, such as:   Changes in normal exercise or activity.   Changes in your normal schedule, such as staying up late, going on vacation,  changing your diet, or holidays.   New medicines. This includes prescription and over-the-counter medicines. Some medicines may cause high blood sugar.   Sickness, stress, or anxiety.   Changes in the time you took your medicine.   Changes in your meals, such as skipping a meal, having a late meal, or dining out.   Eating things that may affect blood glucose, such as snacks, meal portions that are larger than normal, drinks with sugar, or eating less than usual.   Ask your health care provider any questions you have.  Be aware of "stacking" your insulin doses. This happens when you correct a high blood sugar level by giving yourself extra insulin too soon after a previous correction dose or mealtime dose. You may then have too much insulin still active in your body and may be at risk for hypoglycemia. WHY DO YOU NEED A CORRECTION SCALE IF YOU HAVE NEVER BEEN DIAGNOSED WITH DIABETES?   Keeping your blood glucose in the target range is important for your overall health.   You may have been prescribed medicines that cause your blood glucose to be higher than normal. WHEN SHOULD YOU SEEK MEDICAL CARE? Contact your health care provider if:   You have experienced hypoglycemia that you are unable to treat with your usual routine.   You have a high blood sugar level that is not coming down with the correction dose.  Your blood sugar is often too low or does not come up even if you eat a fast-acting carbohydrate. Someone who lives with you should seek immediate medical care if you become unresponsive. Document Released: 11/23/2010 Document Revised: 03/04/2013 Document Reviewed: 12/12/2012 [facility] Patient Information 2014 [place].  Diabetes and Exercise Diabetes mellitus is a common, chronic disease, in which the pancreas is unable to adequately control blood glucose (sugar) levels. There are 2 types of diabetes. Type 1 diabetes patients are unable to produce insulin, a  hormone that causes sugar in the blood to be stored in the body. People with type 1 diabetes may compensate by giving themselves injections of insulin. Type 2 diabetes involves not producing adequate amounts of insulin to control blood glucose levels. People with type 2 diabetes control their blood glucose by monitoring their food intake or by taking medicine. Exercise is an important part of diabetes treatment. During exercise, the muscles use a greater amount of glucose from the blood for energy. This lowers your blood glucose, which is the same effect you would get  from taking insulin. It has been shown that endurance athletes are more sensitive to insulin than inactive people. SYMPTOMS  Many people with a mild case of diabetes have no symptoms. However, if left uncontrolled, diabetes can lead to several complications that could be prevented with treatment of the disease. General symptoms of diabetes include:   Frequent urination (polyuria).  Frequent thirst and drinking (polydipsia).  Increased food consumption (polyphagia).  Fatigue.  Poor exercise performance.  Blurred vision.  Inflammation of the vagina (vaginitis) caused by fungal infections.  Skin infections (uncommon).  Numbness in the feet,caused by nerve injury.  Kidney disease. CAUSES  The cause of most cases of diabetes is unknown. In children, diabetes is often due to an autoimmune response to the cells in the pancreas that make insulin. It is also linked with other diseases, such as cystic fibrosis. Diabetes may have a genetic link. PREVENTION  Athletes should strive to begin exercise with blood glucose in a well-controlled state.  Feet should always be kept clean and dry.  Activities in which low blood sugar levels cannot be treated easily (scuba diving, rock climbing, swimming) should be avoided.  Anticipate alterations in diet or training to avoid low blood sugar (hypoglycemia) and high blood sugar  (hyperglycemia).  Athletes should try to increase sugar consumption after strenuous exercise to avoid hypoglycemia.  Short-acting insulin should not be injected into an actively exercising muscle. The athlete should rest the injection site for about 1 hour after exercise.  Patients with diabetes should get routine checkups of the feet to prevent complications. PROGNOSIS  Exercise provides many benefits to the person with diabetes:   Reduced body fat.  Lower blood pressure.  Often, reduced need for medicines.  Improved exercise tolerance.  Lower insulin levels.  Weight loss.  Improved lipid profile (decreased cholesterol and low-density lipoproteins). RELATED COMPLICATIONS  If performed incorrectly, exercise can result in complications of diabetes:   Poor control of blood sugar, when exercise is performed at the wrong time.  Increase in renal disease, from loss of body fluids (dehydration).  Increased risk of nerve injury (neuropathy) when performing exercises that increase foot injury.  Increased risk of eye problems when performingactivities that involve breath holding or lowering or jarring the head.  Increased risk of sudden death from exercise in patients with heart disease.  Worsening of hypertension with heavy lifting (more than 10 lb/4.5 kg). Altered blood glucose and insulin dose as a result of mild illness that produces loss of appetite.  Altered uptake of insulin after injection when insulin injection site is changed. NOTE: Exercise can lower blood glucose effectively, but the effects are short-lasting (no more than a couple of days). Exercise has been shown to improve your sensitivity to insulin. This may alter how your body responds to a given dose of injected insulin. It is important for every patient with diabetes to know how his or her body may react to exercise, and to adjust insulin dosages accordingly. TREATMENT  Eat about 1 to 3 hours before  exercise.  Check blood glucose immediately before and after exercise.  Stop exercise if blood glucose is more than 250 mg/dL.  Stop exercise if blood glucose is less than 100 mg/dL.  Do not exercise within 1 hour of an insulin injection.  Be prepared to treat low blood glucose while exercising. Keep some sugar product with you, such as a candy bar.  For prolonged exercise, use a sports drink to maintain your glucose level.  Replace used up glucose  in the body after exercise.  Consume fluids during and after exercise to avoid dehydration. SEEK MEDICAL CARE IF:  You have vision changes after a run.  You notice a loss of sensation in your feet after exercise.  You have increased numbness, tingling, or pins and needles sensations after exercise.  You have chest pain during or after exercise.  You have a fast, irregular heartbeat (palpitations) during or after exercise.  Your exercise tolerance gets worse.  You have fainting or dizzy spells for brief periods during or after exercise. Document Released: 07/02/2005 Document Revised: 09/24/2011 Document Reviewed: 10/14/2008 [facility] Patient Information 2014 [place], [place].  Diabetes and Sick Day Management Blood sugar (glucose) can be more difficult to control when you are sick. Colds, fever, flu, nausea, vomiting, and diarrhea are all examples of common illnesses that can cause problems for people with diabetes. Loss of body fluids (dehydration) from fever, vomiting, diarrhea, infection, and the stress of a sickness can all cause blood glucose levels to increase. Because of this, it is very important to take your diabetes medicines and to eat some form of carbohydrate food when you are sick. Liquid or soft foods are often tolerated, and they help to replace fluids. HOME CARE INSTRUCTIONS These main guidelines are intended for managing a short-term (24 hours or less) sickness:  Take your usual dose of insulin or oral diabetes  medicine. An exception would be if you take any form of metformin. If you cannot eat or drink, you can become dehydrated and should not take this medicine.  Continue to take your insulin even if you are unable to eat solid foods or are vomiting. Your insulin dose may stay the same, or it may need to be increased when you are sick.  You will need to test your blood glucose more often, generally every 2 4 hours. If you have type 1 diabetes, test your urine for ketones every 4 hours. If you have type 2 diabetes, test your urine for ketones as directed by your health care provider.  Eat some form of food that contains carbohydrates. The carbohydrates can be in solid or liquid form. You should eat 45 50 g of carbohydrates every 3 4 hours.  Replace fluids if you have a fever, vomit, or have diarrhea. Ask your health care provider for specific rehydration instructions.  Watch carefully for the signs of ketoacidosis if you have type 1 diabetes. Call your health care provider if any of the following symptoms are present, especially in children:  Moderate to large ketones in the urine along with a high blood glucose level.  Severe nausea.  Vomiting.  Diarrhea.  Abdominal pain.  Rapid breathing.  Drink extra liquids that do not contain sugar, such as water or sugar-free liquids, or caffeine.  Be careful with over-the-counter medicines. Read the labels. They may contain sugar or types of sugars that can increase your blood glucose level. Food Choices for Illness All of the food choices below contain about 15 g of carbohydrates. Plan ahead and keep some of these foods around.    to  cup carbonated beverage containing sugar. Carbonated beverages will usually be better tolerated if they are opened and left at room temperature for a few minutes.   of a twin frozen ice pop.   cup regular gelatin.   cup juice.   cup ice cream or frozen yogurt.   cup cooked cereal.   cup sherbet.  1  cup clear broth or soup.  1 cup cream  soup.   cup regular custard.   cup regular pudding.  1 cup sports drink.  1 cup plain yogurt.  1 slice toast.  6 squares saltine crackers.  5 vanilla wafers.   cup sports drink. SEEK MEDICAL CARE IF:   You are unable to drink fluids, even small amounts.  You have nausea and vomiting for more than 6 hours.  You have diarrhea for more than 6 hours.  Your blood glucose level is more than 240 mg/dL, even with additional insulin.  There is a change in mental status.  You develop an additional serious sickness.  You have been sick for 2 days and are not getting better.  You have had a fever for 2 days. SEEK IMMEDIATE MEDICAL CARE IF:  You have difficulty breathing.  You have moderate to large ketone levels. MAKE SURE YOU:  Understand these instructions.  Will watch your condition.  Will get help right away if you are not doing well or get worse. Document Released: 07/05/2003 Document Revised: 03/04/2013 Document Reviewed: 12/09/2012 [facility] Patient Information 2014 [place].  Diabetes, Eating Away From Home Sometimes, you might eat in a restaurant or have meals that are prepared by someone else. You can enjoy eating out. However, the portions in restaurants may be much larger than needed. Listed below are some ideas to help you choose foods that will keep your blood glucose (sugar) in better control.  TIPS FOR EATING OUT  Know your meal plan and how many carbohydrate servings you should have at each meal. You may wish to carry a copy of your meal plan in your purse or wallet. Learn the foods included in each food group.  Make a list of restaurants near you that offer healthy choices. Take a copy of the carry-out menus to see what they offer. Then, you can plan what you will order ahead of time.  Become familiar with serving sizes by practicing them at home using measuring cups and spoons. Once you learn to recognize  portion sizes, you will be able to correctly estimate the amount of total carbohydrate you are allowed to eat at the restaurant. Ask for a takeout box if the portion is more than you should have. When your food comes, leave the amount you should have on the plate, and put the rest in the takeout box before you start eating.  Plan ahead if your mealtime will be different from usual. Check with your caregiver to find out how to time meals and medicine if you are taking insulin.  Avoid high-fat foods, such as fried foods, cream sauces, high-fat salad dressings, or any added butter or margarine.  Do not be afraid to ask questions. Ask your server about the portion size, cooking methods, ingredients and if items can be substituted. Restaurants do not list all available items on the menu. You can ask for your main entree to be prepared using skim milk, oil instead of butter or margarine, and without gravy or sauces. Ask your waiter or waitress to serve salad dressings, gravy, sauces, margarine, and sour cream on the side. You can then add the amount your meal plan suggests.  Add more vegetables whenever possible.  Avoid items that are labeled "jumbo," "giant," "deluxe," or "supersized."  You may want to split an entre with someone and order an extra side salad.  Watch for hidden calories in foods like croutons, bacon, or cheese.  Ask your server to take away the bread basket or chips from your table.  Order a dinner salad as an appetizer. You can eat most foods served in a restaurant. Some foods are better choices than others. Breads and Starches  Recommended: All kinds of bread (wheat, rye, white, oatmeal, [place], [place], raisin), hard or soft dinner rolls, frankfurter or hamburger buns, small bagels, small corn or whole-wheat flour tortillas.  Avoid: Frosted or glazed breads, butter rolls, egg or cheese breads, croissants, sweet rolls, pastries, coffee cake, glazed or frosted doughnuts,  muffins. Crackers  Recommended: Animal crackers, graham, rye, saltine, oyster, and matzoth crackers. Bread sticks, melba toast, rusks, pretzels, popcorn (without fat), zwieback toast.  Avoid: High-fat snack crackers or chips. Buttered popcorn. Cereals  Recommended: Hot and cold cereals. Whole grains such as oatmeal or shredded wheat are good choices.  Avoid: Sugar-coated or granola type cereals. Potatoes/Pasta/Rice/Beans  Recommended: Order baked, boiled, or mashed potatoes, rice or noodles without added fat, whole beans. Order gravies, butter, margarine, or sauces on the side so you can control the amount you add.  Avoid: Hash browns or fried potatoes. Potatoes, pasta, or rice prepared with cream or cheese sauce. Potato or pasta salads prepared with large amounts of dressing. Fried beans or fried rice. Vegetables  Recommended: Order steamed, baked, boiled, or stewed vegetables without sauces or extra fat. Ask that sauce be served on the side. If vegetables are not listed on the menu, ask what is available.  Avoid: Vegetables prepared with cream, butter, or cheese sauce. Fried vegetables. Salad Bars  Recommended: Many of the vegetables at a salad bar are considered "free." Use lemon juice, vinegar, or low-calorie salad dressing (fewer than 20 calories per serving) as "free" dressings for your salad. Look for salad bar ingredients that have no added fat or sugar such as tomatoes, lettuce, cucumbers, broccoli, carrots, onions, and mushrooms.  Avoid: Prepared salads with large amounts of dressing, such as coleslaw, caesar salad, macaroni salad, bean salad, or carrot salad. Fruit  Recommended: Eat fresh fruit or fresh fruit salad without added dressing. A salad bar often offers fresh fruit choices, but canned fruit at a restaurant is usually packed in sugar or syrup.  Avoid: Sweetened canned or frozen fruits, plain or sweetened fruit juice. Fruit salads with dressing, sour cream, or sugar  added to them. Meat and Meat Substitutes  Recommended: Order broiled, baked, roasted, or grilled meat, poultry, or fish. Trim off all visible fat. Do not eat the skin of poultry. The size stated on the menu is the raw weight. Meat shrinks by  in cooking (for example, 4 oz raw equals 3 oz cooked meat).  Avoid: Deep-fat fried meat, poultry, or fish. Breaded meats. Eggs  Recommended: Order soft, hard-cooked, poached, or scrambled eggs. Omelets may be okay, depending on what ingredients are added. Egg substitutes are also a good choice.  Avoid: Fried eggs, eggs prepared with cream or cheese sauce. Milk  Recommended: Order low-fat or fat-free milk according to your meal plan. Plain, nonfat yogurt or flavored yogurt with no sugar added may be used as a substitute for milk. Soy milk may also be used.  Avoid: Milk shakes or sweetened milk beverages. Soups and Combination Foods  Recommended: Clear broth or consomm are "free" foods and may be used as an appetizer. Broth-based soups with fat removed count as a starch serving and are preferred over cream soups. Soups made with beans or split peas may be eaten but count as a starch.  Avoid: Fatty soups, soup made with cream, cheese soup. Combination foods prepared with excessive amounts  of fat or with cream or cheese sauces. Desserts and Sweets  Recommended: Ask for fresh fruit. Sponge or angel food cake without icing, ice milk, no sugar added ice cream, sherbet, or frozen yogurt may fit into your meal plan occasionally.  Avoid: Pastries, puddings, pies, cakes with icing, custard, gelatin desserts. Fats and Oils  Recommended: Choose healthy fats such as olive oil, canola oil, or tub margarine, reduced fat or fat-free sour cream, cream cheese, avocado, or nuts.  Avoid: Any fats in excess of your allowed portion. Deep-fried foods or any food with a large amount of fat. Note: Ask for all fats to be served on the side, and limit your portion sizes  according to your meal plan. Document Released: 07/02/2005 Document Revised: 09/24/2011 Document Reviewed: 01/20/2009 [facility] Patient Information 2014 [place], [place].  Diabetes Meal Planning Guide The diabetes meal planning guide is a tool to help you plan your meals and snacks. It is important for people with diabetes to manage their blood glucose (sugar) levels. Choosing the right foods and the right amounts throughout your day will help control your blood glucose. Eating right can even help you improve your blood pressure and reach or maintain a healthy weight. CARBOHYDRATE COUNTING MADE EASY When you eat carbohydrates, they turn to sugar. This raises your blood glucose level. Counting carbohydrates can help you control this level so you feel better. When you plan your meals by counting carbohydrates, you can have more flexibility in what you eat and balance your medicine with your food intake. Carbohydrate counting simply means adding up the total amount of carbohydrate grams in your meals and snacks. Try to eat about the same amount at each meal. Foods with carbohydrates are listed below. Each portion below is 1 carbohydrate serving or 15 grams of carbohydrates. Ask your dietician how many grams of carbohydrates you should eat at each meal or snack. Grains and Starches  1 slice bread.   English muffin or hotdog/hamburger bun.   cup cold cereal (unsweetened).   cup cooked pasta or rice.   cup starchy vegetables (corn, potatoes, peas, beans, winter squash).  1 tortilla (6 inches).   bagel.  1 waffle or pancake (size of a CD).   cup cooked cereal.  4 to 6 small crackers. *Whole grain is recommended. Fruit  1 cup fresh unsweetened berries, melon, papaya, pineapple.  1 small fresh fruit.   banana or mango.   cup fruit juice (4 oz unsweetened).   cup canned fruit in natural juice or water.  2 tbs dried fruit.  12 to 15 grapes or cherries. Milk and Yogurt  1  cup fat-free or 1% milk.  1 cup soy milk.  6 oz light yogurt with sugar-free sweetener.  6 oz low-fat soy yogurt.  6 oz plain yogurt. Vegetables  1 cup raw or  cup cooked is counted as 0 carbohydrates or a "free" food.  If you eat 3 or more servings at 1 meal, count them as 1 carbohydrate serving. Other Carbohydrates   oz chips or pretzels.   cup ice cream or frozen yogurt.   cup sherbet or sorbet.  2 inch square cake, no frosting.  1 tbs honey, sugar, jam, jelly, or syrup.  2 small cookies.  3 squares of graham crackers.  3 cups popcorn.  6 crackers.  1 cup broth-based soup.  Count 1 cup casserole or other mixed foods as 2 carbohydrate servings.  Foods with less than 20 calories in a serving may be counted  as 0 carbohydrates or a "free" food. You may want to purchase a book or computer software that lists the carbohydrate gram counts of different foods. In addition, the nutrition facts panel on the labels of the foods you eat are a good source of this information. The label will tell you how big the serving size is and the total number of carbohydrate grams you will be eating per serving. Divide this number by 15 to obtain the number of carbohydrate servings in a portion. Remember, 1 carbohydrate serving equals 15 grams of carbohydrate. SERVING SIZES Measuring foods and serving sizes helps you make sure you are getting the right amount of food. The list below tells how big or small some common serving sizes are.  1 oz.........4 stacked dice.  3 oz........Marland KitchenDeck of cards.  1 tsp.......Marland KitchenTip of little finger.  1 tbs......Marland KitchenMarland KitchenThumb.  2 tbs.......Marland KitchenGolf ball.   cup......Marland KitchenHalf of a fist.  1 cup.......Marland KitchenA fist. SAMPLE DIABETES MEAL PLAN Below is a sample meal plan that includes foods from the grain and starches, dairy, vegetable, fruit, and meat groups. A dietician can individualize a meal plan to fit your calorie needs and tell you the number of servings needed  from each food group. However, controlling the total amount of carbohydrates in your meal or snack is more important than making sure you include all of the food groups at every meal. You may interchange carbohydrate containing foods (dairy, starches, and fruits). The meal plan below is an example of a 2000 calorie diet using carbohydrate counting. This meal plan has 17 carbohydrate servings. Breakfast  1 cup oatmeal (2 carb servings).   cup light yogurt (1 carb serving).  1 cup blueberries (1 carb serving).   cup almonds. Snack  1 large apple (2 carb servings).  1 low-fat string cheese stick. Lunch  Chicken breast salad.  1 cup spinach.   cup chopped tomatoes.  2 oz chicken breast, sliced.  2 tbs low-fat [place] dressing.  12 whole-wheat crackers (2 carb servings).  12 to 15 grapes (1 carb serving).  1 cup low-fat milk (1 carb serving). Snack  1 cup carrots.   cup hummus (1 carb serving). Dinner  3 oz broiled salmon.  1 cup brown rice (3 carb servings). Snack  1  cups steamed broccoli (1 carb serving) drizzled with 1 tsp olive oil and lemon juice.  1 cup light pudding (2 carb servings). DIABETES MEAL PLANNING WORKSHEET Your dietician can use this worksheet to help you decide how many servings of foods and what types of foods are right for you.  BREAKFAST Food Group and Servings / Carb Servings Grain/Starches __________________________________ Dairy __________________________________________ Vegetable ______________________________________ Fruit ___________________________________________ Meat __________________________________________ Fat ____________________________________________ LUNCH Food Group and Servings / Carb Servings Grain/Starches ___________________________________ Dairy ___________________________________________ Fruit ____________________________________________ Meat ___________________________________________ Fat  _____________________________________________ Kristie [name] Food Group and Servings / Carb Servings Grain/Starches ___________________________________ Dairy ___________________________________________ Fruit ____________________________________________ Meat ___________________________________________ Fat _____________________________________________ SNACKS Food Group and Servings / Carb Servings Grain/Starches ___________________________________ Dairy ___________________________________________ Vegetable _______________________________________ Fruit ____________________________________________ Meat ___________________________________________ Fat _____________________________________________ DAILY TOTALS Starches _________________________ Vegetable ________________________ Fruit ____________________________ Dairy ____________________________ Meat ____________________________ Fat ______________________________ Document Released: 03/29/2005 Document Revised: 09/24/2011 Document Reviewed: 02/07/2009 [employer] Patient Information 2014 [employer].  Diets for Diabetes, Food Labeling Look at food labels to help you decide how much of a product you can eat. You will want to check the amount of total carbohydrate in a serving to see how the food fits into your meal plan. In the list of ingredients, the ingredient present  in the largest amount by weight must be listed first, followed by the other ingredients in descending order. STANDARD OF IDENTITY Most products have a list of ingredients. However, foods that the Food and Drug Administration (FDA) has given a standard of identity do not need a list of ingredients. A standard of identity means that a food must contain certain ingredients if it is called a particular name. Examples are mayonnaise, peanut butter, ketchup, jelly, and cheese. LABELING TERMS There are many terms found on food labels. Some of these terms have specific definitions. Some  terms are regulated by the FDA, and the FDA has clearly specified how they can be used. Others are not regulated or well-defined and can be misleading and confusing. SPECIFICALLY DEFINED TERMS Nutritive Sweetener.  A sweetener that contains calories,such as table sugar or honey. Nonnutritive Sweetener.  A sweetener with few or no calories,such as saccharin, aspartame, sucralose, and cyclamate. LABELING TERMS REGULATED BY THE FDA Free.  The product contains only a tiny or small amount of fat, cholesterol, sodium, sugar, or calories. For example, a "fat-free" product will contain less than 0.5 g of fat per serving. Low.  A food described as "low" in fat, saturated fat, cholesterol, sodium, or calories could be eaten fairly often without exceeding dietary guidelines. For example, "low in fat" means no more than 3 g of fat per serving. Lean.  "Lean" and "extra lean" are U.S. Department of Agriculture Scientist, research (physical sciences)) terms for use on meat and poultry products. "Lean" means the product contains less than 10 g of fat, 4 g of saturated fat, and 95 mg of cholesterol per serving. "Lean" is not as low in fat as a product labeled "low." Extra Lean.  "Extra lean" means the product contains less than 5 g of fat, 2 g of saturated fat, and 95 mg of cholesterol per serving. While "extra lean" has less fat than "lean," it is still higher in fat than a product labeled "low." Reduced, Less, Fewer.  A diet product that contains 25% less of a nutrient or calories than the regular version. For example, hot dogs might be labeled "25% less fat than our regular hot dogs." Light/Lite.  A diet product that contains  fewer calories or  the fat of the original. For example, "light in sodium" means a product with  the usual sodium. More.  One serving contains at least 10% more of the daily value of a vitamin, mineral, or fiber than usual. Good Source Of.  One serving contains 10% to 19% of the daily value for a particular  vitamin, mineral, or fiber. Excellent Source Of.  One serving contains 20% or more of the daily value for a particular nutrient. Other terms used might be "high in" or "rich in." Enriched or Fortified.  The product contains added vitamins, minerals, or protein. Nutrition labeling must be used on enriched or fortified foods. Imitation.  The product has been altered so that it is lower in protein, vitamins, or minerals than the usual food,such as imitation peanut butter. Total Fat.  The number listed is the total of all fat found in a serving of the product. Under total fat, food labels must list saturated fat and trans fat, which are associated with raising bad cholesterol and an increased risk of heart blood vessel disease. Saturated Fat.  Mainly fats from animal-based sources. Some examples are red meat, cheese, cream, whole milk, and coconut oil. Trans Fat.  Found in some fried snack foods, packaged foods, and fried  restaurant foods. It is recommended you eat as close to 0 g of trans fat as possible, since it raises bad cholesterol and lowers good cholesterol. Polyunsaturated and Monounsaturated Fats.  More healthful fats. These fats are from plant sources. Total Carbohydrate.  The number of carbohydrate grams in a serving of the product. Under total carbohydrate are listed the other carbohydrate sources, such as dietary fiber and sugars. Dietary Fiber.  A carbohydrate from plant sources. Sugars.  Sugars listed on the label contain all naturally occurring sugars as well as added sugars. LABELING TERMS NOT REGULATED BY THE FDA Sugarless.  Table sugar (sucrose) has not been added. However, the manufacturer may use another form of sugar in place of sucrose to sweeten the product. For example, sugar alcohols are used to sweeten foods. Sugar alcohols are a form of sugar but are not table sugar. If a product contains sugar alcohols in place of sucrose, it can still be labeled  "sugarless." Low Salt, Salt-Free, Unsalted, No Salt, No Salt Added, Without Added Salt.  Food that is usually processed with salt has been made without salt. However, the food may contain sodium-containing additives, such as preservatives, leavening agents, or flavorings. Natural.  This term has no legal meaning. Organic.  Foods that are certified as organic have been inspected and approved by the USDA to ensure they are produced without pesticides, fertilizers containing synthetic ingredients, bioengineering, or ionizing radiation. Document Released: 07/05/2003 Document Revised: 09/24/2011 Document Reviewed: 01/20/2009 [facility] Patient Information 2014 [place], [place].  Daily Diabetes Record Week of _____________________________ Date: _________  Kristie [name], BG/Medications: ________________ / __________________________________________________________  LUNCH, BG/Medications: ____________________ / __________________________________________________________  Kristie [name], BG/Medications: ___________________ / __________________________________________________________  BEDTIME, BG/Medications: __________________ / __________________________________________________________ Date: _________  Kristie [name], BG/Medications: ________________ / __________________________________________________________  LUNCH, BG/Medications: ____________________ / __________________________________________________________  Kristie [name], BG/Medications: ___________________ / __________________________________________________________  BEDTIME, BG/Medications: __________________ / __________________________________________________________ Date: _________  Kristie [name], BG/Medications: ________________ / __________________________________________________________  LUNCH, BG/Medications: ____________________ / __________________________________________________________  Kristie [name], BG/Medications: ___________________ /  __________________________________________________________  BEDTIME, BG/Medications: __________________ / __________________________________________________________ Date: _________  Kristie [name], BG/Medications: ________________ / __________________________________________________________  LUNCH, BG/Medications: ____________________ / __________________________________________________________  Kristie [name], BG/Medications: ___________________ / __________________________________________________________  BEDTIME, BG/Medications: __________________ / __________________________________________________________ Date: _________  Kristie [name], BG/Medications: ________________ / __________________________________________________________  LUNCH, BG/Medications: ____________________ / __________________________________________________________  Kristie [name], BG/Medications: ___________________ / __________________________________________________________  BEDTIME, BG/Medications: __________________ / __________________________________________________________ Date: _________  Kristie [name], BG/Medications: ________________ / __________________________________________________________  LUNCH, BG/Medications: ____________________ / __________________________________________________________  Kristie [name], BG/Medications: ___________________ / __________________________________________________________  BEDTIME, BG/Medications: __________________ / __________________________________________________________ Date: _________  Kristie [name], BG/Medications: ________________ / __________________________________________________________  LUNCH, BG/Medications: ____________________ / __________________________________________________________  Kristie [name], BG/Medications: ___________________ / __________________________________________________________  BEDTIME, BG/Medications: __________________ /  __________________________________________________________ Notes: __________________________________________________________________________________________________ Document Released: 06/05/2004 Document Revised: 09/24/2011 Document Reviewed: 03/30/2009 [employer] Patient Information 2014 [place].

## 2013-08-06 NOTE — Progress Notes (Signed)
Patient was discharged home by MD order; discharged instructions review and give to patient with care notes and prescriptions; IV DIC; skin intact; patient will be escorted to the car by nurse tech via wheelchair.  

## 2013-08-17 ENCOUNTER — Ambulatory Visit (INDEPENDENT_AMBULATORY_CARE_PROVIDER_SITE_OTHER): Payer: Medicare Other | Admitting: Family Medicine

## 2013-08-17 ENCOUNTER — Encounter: Payer: Self-pay | Admitting: Family Medicine

## 2013-08-17 VITALS — BP 93/66 | HR 98 | Temp 98.5°F | Ht 67.5 in | Wt 232.0 lb

## 2013-08-17 DIAGNOSIS — E1165 Type 2 diabetes mellitus with hyperglycemia: Secondary | ICD-10-CM

## 2013-08-17 DIAGNOSIS — F329 Major depressive disorder, single episode, unspecified: Secondary | ICD-10-CM

## 2013-08-17 DIAGNOSIS — F32A Depression, unspecified: Secondary | ICD-10-CM

## 2013-08-17 DIAGNOSIS — F419 Anxiety disorder, unspecified: Secondary | ICD-10-CM

## 2013-08-17 DIAGNOSIS — D509 Iron deficiency anemia, unspecified: Secondary | ICD-10-CM

## 2013-08-17 DIAGNOSIS — F341 Dysthymic disorder: Secondary | ICD-10-CM

## 2013-08-17 DIAGNOSIS — IMO0001 Reserved for inherently not codable concepts without codable children: Secondary | ICD-10-CM

## 2013-08-17 LAB — CBC WITH DIFFERENTIAL/PLATELET
Basophils Absolute: 0 10*3/uL (ref 0.0–0.1)
Basophils Relative: 0.5 % (ref 0.0–3.0)
Eosinophils Absolute: 0.1 10*3/uL (ref 0.0–0.7)
Eosinophils Relative: 1.2 % (ref 0.0–5.0)
HCT: 34.5 % — ABNORMAL LOW (ref 36.0–46.0)
HEMOGLOBIN: 10.7 g/dL — AB (ref 12.0–15.0)
LYMPHS PCT: 24.2 % (ref 12.0–46.0)
Lymphs Abs: 1.4 10*3/uL (ref 0.7–4.0)
MCHC: 31.1 g/dL (ref 30.0–36.0)
MCV: 77.8 fl — ABNORMAL LOW (ref 78.0–100.0)
MONOS PCT: 9.4 % (ref 3.0–12.0)
Monocytes Absolute: 0.5 10*3/uL (ref 0.1–1.0)
NEUTROS ABS: 3.7 10*3/uL (ref 1.4–7.7)
NEUTROS PCT: 64.7 % (ref 43.0–77.0)
PLATELETS: 428 10*3/uL — AB (ref 150.0–400.0)
RBC: 4.44 Mil/uL (ref 3.87–5.11)
RDW: 17.2 % — ABNORMAL HIGH (ref 11.5–14.6)
WBC: 5.8 10*3/uL (ref 4.5–10.5)

## 2013-08-17 LAB — COMPREHENSIVE METABOLIC PANEL
ALT: 30 U/L (ref 0–35)
AST: 30 U/L (ref 0–37)
Albumin: 3.3 g/dL — ABNORMAL LOW (ref 3.5–5.2)
Alkaline Phosphatase: 163 U/L — ABNORMAL HIGH (ref 39–117)
BUN: 15 mg/dL (ref 6–23)
CALCIUM: 9.1 mg/dL (ref 8.4–10.5)
CHLORIDE: 104 meq/L (ref 96–112)
CO2: 25 meq/L (ref 19–32)
CREATININE: 0.9 mg/dL (ref 0.4–1.2)
GFR: 74.95 mL/min (ref >60.00)
Glucose, Bld: 122 mg/dL — ABNORMAL HIGH (ref 70–99)
POTASSIUM: 4.2 meq/L (ref 3.5–5.1)
SODIUM: 138 meq/L (ref 135–145)
TOTAL PROTEIN: 6.6 g/dL (ref 6.0–8.3)
Total Bilirubin: 0.8 mg/dL (ref 0.3–1.2)

## 2013-08-17 LAB — IBC PANEL
Iron: 18 ug/dL — ABNORMAL LOW (ref 42–145)
Saturation Ratios: 3.5 % — ABNORMAL LOW (ref 20.0–50.0)
Transferrin: 363.6 mg/dL — ABNORMAL HIGH (ref 212.0–360.0)

## 2013-08-17 LAB — FERRITIN: FERRITIN: 10.1 ng/mL (ref 10.0–291.0)

## 2013-08-17 MED ORDER — VENLAFAXINE HCL ER 150 MG PO CP24: 300.0000 mg | ORAL_CAPSULE | Freq: Every day | ORAL | Status: DC

## 2013-08-17 MED ORDER — CLONAZEPAM 1 MG PO TABS: 1.0000 mg | ORAL_TABLET | Freq: Three times a day (TID) | ORAL | Status: DC

## 2013-08-17 MED ORDER — METFORMIN HCL 500 MG PO TABS: ORAL_TABLET | ORAL | Status: DC

## 2013-08-17 MED ORDER — FERROUS SULFATE 325 (65 FE) MG PO TBEC: 325.0000 mg | DELAYED_RELEASE_TABLET | Freq: Two times a day (BID) | ORAL | Status: DC
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
## 2013-08-17 NOTE — Progress Notes (Signed)
Pre-visit discussion using our clinic review tool. No additional management support is needed unless otherwise documented below in the visit note.  

## 2013-08-20 ENCOUNTER — Telehealth: Payer: Self-pay

## 2013-08-20 NOTE — Telephone Encounter (Signed)
Relevant patient education assigned to patient using Emmi. ° °

## 2013-09-09 ENCOUNTER — Other Ambulatory Visit: Payer: Self-pay | Admitting: Family Medicine

## 2013-09-09 ENCOUNTER — Other Ambulatory Visit: Payer: Medicare Other

## 2013-09-09 ENCOUNTER — Other Ambulatory Visit: Payer: Self-pay

## 2013-09-09 DIAGNOSIS — D649 Anemia, unspecified: Secondary | ICD-10-CM

## 2013-09-09 LAB — HEMOCCULT SLIDES (X 3 CARDS)
FECAL OCCULT BLD: NEGATIVE
OCCULT 1: NEGATIVE
OCCULT 2: NEGATIVE
OCCULT 3: NEGATIVE
OCCULT 4: NEGATIVE
OCCULT 5: NEGATIVE

## 2013-09-14 ENCOUNTER — Ambulatory Visit: Payer: Medicare Other | Admitting: Family Medicine

## 2013-09-15 ENCOUNTER — Encounter: Payer: Medicare Other | Attending: Internal Medicine

## 2013-09-15 ENCOUNTER — Ambulatory Visit (INDEPENDENT_AMBULATORY_CARE_PROVIDER_SITE_OTHER): Payer: Medicare Other | Admitting: Family Medicine

## 2013-09-15 ENCOUNTER — Encounter: Payer: Self-pay | Admitting: Family Medicine

## 2013-09-15 VITALS — BP 105/75 | HR 80 | Temp 98.4°F | Resp 18 | Ht 67.5 in | Wt 231.0 lb

## 2013-09-15 VITALS — Ht 67.5 in | Wt 236.7 lb

## 2013-09-15 DIAGNOSIS — D509 Iron deficiency anemia, unspecified: Secondary | ICD-10-CM

## 2013-09-15 DIAGNOSIS — E109 Type 1 diabetes mellitus without complications: Secondary | ICD-10-CM | POA: Insufficient documentation

## 2013-09-15 DIAGNOSIS — Z713 Dietary counseling and surveillance: Secondary | ICD-10-CM | POA: Insufficient documentation

## 2013-09-15 DIAGNOSIS — IMO0001 Reserved for inherently not codable concepts without codable children: Secondary | ICD-10-CM

## 2013-09-15 DIAGNOSIS — Z794 Long term (current) use of insulin: Secondary | ICD-10-CM | POA: Insufficient documentation

## 2013-09-15 DIAGNOSIS — E1165 Type 2 diabetes mellitus with hyperglycemia: Secondary | ICD-10-CM

## 2013-09-15 LAB — CBC WITH DIFFERENTIAL/PLATELET
BASOS ABS: 0 10*3/uL (ref 0.0–0.1)
Basophils Relative: 0.4 % (ref 0.0–3.0)
Eosinophils Absolute: 0.2 10*3/uL (ref 0.0–0.7)
Eosinophils Relative: 2.1 % (ref 0.0–5.0)
HEMATOCRIT: 33.5 % — AB (ref 36.0–46.0)
Hemoglobin: 10.3 g/dL — ABNORMAL LOW (ref 12.0–15.0)
LYMPHS ABS: 2 10*3/uL (ref 0.7–4.0)
Lymphocytes Relative: 25.4 % (ref 12.0–46.0)
MCHC: 30.7 g/dL (ref 30.0–36.0)
MCV: 76.5 fl — AB (ref 78.0–100.0)
Monocytes Absolute: 0.5 10*3/uL (ref 0.1–1.0)
Monocytes Relative: 6.1 % (ref 3.0–12.0)
Neutro Abs: 5.1 10*3/uL (ref 1.4–7.7)
Neutrophils Relative %: 66 % (ref 43.0–77.0)
Platelets: 444 10*3/uL — ABNORMAL HIGH (ref 150.0–400.0)
RBC: 4.39 Mil/uL (ref 3.87–5.11)
RDW: 17.8 % — AB (ref 11.5–14.6)
WBC: 7.7 10*3/uL (ref 4.5–10.5)

## 2013-09-15 MED ORDER — INSULIN NPH ISOPHANE & REGULAR (70-30) 100 UNIT/ML ~~LOC~~ SUSP: SUBCUTANEOUS | Status: DC

## 2013-09-15 NOTE — Progress Notes (Signed)
OFFICE NOTE  09/15/2013  CC:  Chief Complaint  Patient presents with  . Follow-up     HPI: Patient is a 48 y.o. Caucasian female who is here for 1 mo f/u DM 2. Goes to diabetic nutr class this evening. VEry tired all the time--this is a chronic complaint for her. Denies snoring or hx of witnessed apnea during sleep. Has been religiously watching her diet and how it affects her glucoses.  Fasting avg is 130-140. Lunch 155-65 Supper upper 100s to 200.  Taking 70/30 35 qAM and 18 qPM  LMP 08/31/13, very heavy as usual for her. Takes FeSO4 two tabs qAM and 1 tab qhs.   Hemoccults about 1 wk ago were all negative.   Pertinent PMH:  Past medical, surgical, social, and family history reviewed and no changes are noted since last office visit.  MEDS:  Outpatient Prescriptions Prior to Visit  Medication Sig Dispense Refill  . clonazePAM (KLONOPIN) 1 MG tablet Take 1-2 tablets (1-2 mg total) by mouth 3 (three) times daily. 1 tab a.m. 1 tab at 2p and 2 tabs at bedtime  120 tablet  5  . ferrous sulfate 325 (65 FE) MG EC tablet Take 1 tablet (325 mg total) by mouth 2 (two) times daily.      . fluticasone (FLONASE) 50 MCG/ACT nasal spray       . insulin NPH-regular Human (NOVOLIN 70/30) (70-30) 100 UNIT/ML injection Inject 18 Units into the skin at bedtime.  10 mL  11  . insulin NPH-regular Human (NOVOLIN 70/30) (70-30) 100 UNIT/ML injection Inject 35 Units into the skin daily with breakfast.  10 mL  11  . metFORMIN (GLUCOPHAGE) 500 MG tablet 1 tab po tid  90 tablet  6  . venlafaxine XR (EFFEXOR-XR) 150 MG 24 hr capsule Take 2 capsules (300 mg total) by mouth daily.  60 capsule  6  . EPINEPHrine (EPI-PEN) 0.3 mg/0.3 mL DEVI Inject 0.3mg  into thigh muscle once in case of severe allergic reaction, then proceed to nearest ER.  0.3 mL  0   No facility-administered medications prior to visit.    PE: Blood pressure 105/75, pulse 80, temperature 98.4 F (36.9 C), temperature source Temporal,  resp. rate 18, height 5' 7.5" (1.715 m), weight 231 lb (104.781 kg), last menstrual period 08/31/2013, SpO2 97.00%. Gen: Alert, well appearing.  Patient is oriented to person, place, time, and situation. AFFECT: pleasant, lucid thought and speech. No further exam today.  IMPRESSION AND PLAN:  Type II or unspecified type diabetes mellitus without mention of complication, uncontrolled Increase 70/30 insulin to 40 U qAM and keep all other meds/dosing same. Continue to work on diet/exercise.  Iron deficiency anemia Secondary to menorrhagia.  Endometrial bx neg in past.  GYN to do hysterectomy only if HbA1c down to 6.5%. Will recheck CBC today.  May be able to down-titrate FeSO4 from 3 tabs a day to 1 or 2 tabs a day.  Chronic fatigue: related to chronic poor control of DM 2 + chronic mild anemia.  FOLLOW UP: 1 mo

## 2013-09-15 NOTE — Progress Notes (Signed)
Pre visit review using our clinic review tool, if applicable. No additional management support is needed unless otherwise documented below in the visit note. 

## 2013-09-16 DIAGNOSIS — D509 Iron deficiency anemia, unspecified: Secondary | ICD-10-CM | POA: Insufficient documentation

## 2013-09-16 NOTE — Assessment & Plan Note (Signed)
Secondary to menorrhagia.  Endometrial bx neg in past.  GYN to do hysterectomy only if HbA1c down to 6.5%. Will recheck CBC today.  May be able to down-titrate FeSO4 from 3 tabs a day to 1 or 2 tabs a day.

## 2013-09-16 NOTE — Assessment & Plan Note (Signed)
Increase 70/30 insulin to 40 U qAM and keep all other meds/dosing same. Continue to work on diet/exercise.

## 2013-09-21 ENCOUNTER — Telehealth: Payer: Self-pay | Admitting: Family Medicine

## 2013-09-21 ENCOUNTER — Other Ambulatory Visit: Payer: Self-pay | Admitting: Family Medicine

## 2013-09-21 DIAGNOSIS — E1165 Type 2 diabetes mellitus with hyperglycemia: Principal | ICD-10-CM

## 2013-09-21 DIAGNOSIS — IMO0001 Reserved for inherently not codable concepts without codable children: Secondary | ICD-10-CM

## 2013-09-21 NOTE — Telephone Encounter (Signed)
Kristie [name] with [facility] has met with the patient. She would like to talk with Dr. Anitra Lauth. Please call her at [phone]. thanks

## 2013-09-21 NOTE — Progress Notes (Signed)
Patient was seen on 09/15/13 for the first of a series of three diabetes self[facility]management [facility]  Current HbA1c: 10.4%   The following learning objectives were met by the patient during this class:  Describe diabetes  State some common risk factors for diabetes  Defines the role of glucose and insulin  Identifies type of diabetes and pathophysiology  Describe the relationship between diabetes and cardiovascular risk  State the members of the Healthcare Team  States the rationale for glucose monitoring  State when to test glucose  State their individual Target Range  State the importance of logging glucose readings  Describe how to interpret glucose readings  Identifies A1C target  Explain the correlation between A1c and eAG values  State symptoms and treatment of high blood glucose  State symptoms and treatment of low blood glucose  Explain proper technique for glucose testing  Identifies proper sharps disposal  Handouts given during class include:  Living Well with Diabetes book  Carb Counting and Meal Planning book  Meal Plan Card  Carbohydrate guide  Meal planning worksheet  Low Sodium Flavoring Tips  The diabetes portion plate  X5A to eAG Conversion Chart  Diabetes Medications  Diabetes Recommended Care Schedule  Support Group  Diabetes Success Plan  Core Class Satisfaction Survey  Follow-Up Plan:  Attend core 2

## 2013-09-22 DIAGNOSIS — IMO0001 Reserved for inherently not codable concepts without codable children: Secondary | ICD-10-CM

## 2013-09-22 DIAGNOSIS — E1165 Type 2 diabetes mellitus with hyperglycemia: Principal | ICD-10-CM

## 2013-09-23 ENCOUNTER — Encounter: Payer: Self-pay | Admitting: *Deleted

## 2013-09-23 NOTE — Progress Notes (Signed)

## 2013-09-23 NOTE — Telephone Encounter (Signed)
Patient aware.  She scheduled lab visit for tomorrow morning at 8:45.

## 2013-09-23 NOTE — Progress Notes (Signed)
DIABETES: Spoke with patient on 09/15/13 following Core I class at [facility]. Patient shared details of recent visit to hospital due to hyperglycemic event. I discussed with patient the benefits of an insulin pump. She was very interested in consulting with Dr. Williemae Natter and exploring her eligibility. I left a message for Dr. Idelle Leech clinical assistant 09/21/13 @ 4:15 to return my call in order to discuss further.Ulyses Jarred RN, [place]

## 2013-09-23 NOTE — Telephone Encounter (Signed)
I talked to Ulyses Jarred, pt's nutritionist. trying to qualify for insulin pump. Needs to get on lab schedule here to get a couple of labs (I'll enter order). Pls call and get her on lab schedule---pls make sure she understands she must be fasting for at least 8 hours.-thx

## 2013-09-24 ENCOUNTER — Other Ambulatory Visit: Payer: Medicare Other

## 2013-09-24 DIAGNOSIS — E1165 Type 2 diabetes mellitus with hyperglycemia: Principal | ICD-10-CM

## 2013-09-24 DIAGNOSIS — IMO0001 Reserved for inherently not codable concepts without codable children: Secondary | ICD-10-CM

## 2013-09-25 LAB — GLUCOSE, FASTING: Glucose, Fasting: 121 mg/dL — ABNORMAL HIGH (ref 70–99)

## 2013-09-25 LAB — C-PEPTIDE

## 2013-09-29 DIAGNOSIS — IMO0001 Reserved for inherently not codable concepts without codable children: Secondary | ICD-10-CM

## 2013-09-29 DIAGNOSIS — E1165 Type 2 diabetes mellitus with hyperglycemia: Secondary | ICD-10-CM

## 2013-09-29 DIAGNOSIS — E1110 Type 2 diabetes mellitus with ketoacidosis without coma: Secondary | ICD-10-CM

## 2013-09-29 NOTE — Progress Notes (Signed)
Patient was seen on 09/29/13 for the third of a series of three diabetes self-management courses at [facility]. The following learning objectives were met by the patient during this class:    State the amount of activity recommended for healthy living   Describe activities suitable for individual needs   Identify ways to regularly incorporate activity into daily life   Identify barriers to activity and ways to over come these barriers  Identify diabetes medications being personally used and their primary action for lowering glucose and possible side effects   Describe role of stress on blood glucose and develop strategies to address psychosocial issues   Identify diabetes complications and ways to prevent them  Explain how to manage diabetes during illness   Evaluate success in meeting personal goal   Establish 2-3 goals that they will plan to diligently work on until they return for the  20-monthfollow-up visit  Goals:  Follow Diabetes Meal Plan as instructed  Aim for 15-30 mins of physical activity daily as tolerated  Bring food record and glucose log to your follow up visit  Your patient has established the following 4 month goals in their individualized success plan: I will increase my activity level at least 5 days a week I will test my glucose at least 4 times a day, 7 days a week  Your patient has identified these potential barriers to change:  Eat better and try to control my food intake  Your patient has identified their diabetes self-care support plan as  [facility]  Education about an insulin pump

## 2013-10-01 ENCOUNTER — Telehealth: Payer: Self-pay | Admitting: *Deleted

## 2013-10-01 ENCOUNTER — Telehealth: Payer: Self-pay

## 2013-10-01 NOTE — Telephone Encounter (Signed)
Kristie [name] from [facility] called and stated that she reviewed pt's labs and has concluded that she has Type 1 diabetes. She just wanted to let you know they are moving towards her getting an insulin pump.

## 2013-10-01 NOTE — Telephone Encounter (Signed)
Noted  

## 2013-10-13 ENCOUNTER — Encounter: Payer: Medicare Other | Admitting: *Deleted

## 2013-10-13 DIAGNOSIS — IMO0002 Reserved for concepts with insufficient information to code with codable children: Secondary | ICD-10-CM

## 2013-10-13 DIAGNOSIS — E1065 Type 1 diabetes mellitus with hyperglycemia: Secondary | ICD-10-CM

## 2013-10-13 NOTE — Progress Notes (Signed)
Introduction to Insulin Pump Therapy:  Appt start time: 1500 end time:  1600.  Assessment:  This patient has DM 1 and their primary concerns today: to learn more about insulin pumps.  This patient is interested in learning more about insulin pump therapy because her BGs vary so much every day  MEDICATIONS: She is using Novolin 70/30 40 units in AM and 20 units in PM Total of insulin doses per day 60 Other diabetes medications: Metformin  Patient states they are testing 5-8 times per day Patient does not currently have Ketone Strips  This patient is not currently adjusting bolus insulin based BG  This patient is not currently adjusting bolus insulin based on carb intake   Patient states knowledge of Carb Counting is very good  Usual physical activity: limited due to history of hypoglycemia  Last A1c was 10.4 on this date 08/05/13  Patient states they have had hypoglycemia daily in the past month Patient states their biggest barrier with diabetes is variability of BG  Progress Towards Obtaining an Insulin PumpGoal(s):  In progress.  Patient states their expectations of pump therapy include: more stable BGs Patient expresses understanding that for improved outcomes for their diabetes on an insulin pump they will:  Check BG 4-6 times per day  Change out pump infusion set at least every 3 days  Upload pump information to software on a regular basis so provider can assess patterns and make setting adjustments.      Intervention:    Taught difference between delivery of insulin via syringe/pen compared to insulin pump.  Demonstrated improved insulin delivery via pump due to improved accuracy of dose and flexibility of adjusting bolus insulin based on carb intake and BG correction.  Demonstrated pump, insulin reservoir and infusion set options, and button pushing for bolus delivery of insulin through the pump  Explained importance of testing BG at least 4 times per day for  appropriate correction of high BG and prevention of DKA as applicable.  Emphasized importance of follow up after Pump Start for appropriate pump setting adjustments and on-going training on more advanced features.  Handouts given during visit include:  Insulin Pump Packet from [employer]  Monitoring/Evaluation:    Patient does want to continue with pursuit of insulin pump.  Patient provided BG logs at 4x per day for the past 30 days due to insurance company requirements  Patient does not have a computer at home so she cannot go to [employer] pump web-site to complete learning module on insulin pump  Follow up will be to set up pump start when her pump is shipped prn.

## 2013-10-15 ENCOUNTER — Encounter: Payer: Self-pay | Admitting: Family Medicine

## 2013-10-15 ENCOUNTER — Ambulatory Visit (INDEPENDENT_AMBULATORY_CARE_PROVIDER_SITE_OTHER): Payer: Medicare Other | Admitting: Family Medicine

## 2013-10-15 VITALS — BP 115/80 | HR 85 | Temp 98.6°F | Resp 18 | Ht 67.5 in | Wt 227.0 lb

## 2013-10-15 DIAGNOSIS — D509 Iron deficiency anemia, unspecified: Secondary | ICD-10-CM

## 2013-10-15 DIAGNOSIS — IMO0001 Reserved for inherently not codable concepts without codable children: Secondary | ICD-10-CM

## 2013-10-15 DIAGNOSIS — E1165 Type 2 diabetes mellitus with hyperglycemia: Principal | ICD-10-CM

## 2013-10-15 LAB — MICROALBUMIN / CREATININE URINE RATIO
Creatinine,U: 132.7 mg/dL
Microalb Creat Ratio: 3.7 mg/g (ref 0.0–30.0)
Microalb, Ur: 4.9 mg/dL — ABNORMAL HIGH (ref 0.0–1.9)

## 2013-10-15 MED ORDER — CLONAZEPAM 1 MG PO TABS: 1.0000 mg | ORAL_TABLET | Freq: Three times a day (TID) | ORAL | Status: DC

## 2013-10-15 MED ORDER — METFORMIN HCL 500 MG PO TABS: ORAL_TABLET | ORAL | Status: DC

## 2013-10-15 MED ORDER — VENLAFAXINE HCL ER 150 MG PO CP24: 300.0000 mg | ORAL_CAPSULE | Freq: Every day | ORAL | Status: DC
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
## 2013-10-15 NOTE — Progress Notes (Signed)
Pre visit review using our clinic review tool, if applicable. No additional management support is needed unless otherwise documented below in the visit note. 

## 2013-10-16 ENCOUNTER — Ambulatory Visit: Payer: Medicare Other | Admitting: Family Medicine

## 2013-10-20 ENCOUNTER — Other Ambulatory Visit (HOSPITAL_BASED_OUTPATIENT_CLINIC_OR_DEPARTMENT_OTHER): Payer: Self-pay | Admitting: Emergency Medicine

## 2013-10-20 DIAGNOSIS — Z1231 Encounter for screening mammogram for malignant neoplasm of breast: Secondary | ICD-10-CM

## 2013-10-26 ENCOUNTER — Ambulatory Visit: Payer: Medicare Other | Admitting: *Deleted

## 2013-10-29 LAB — HM DIABETES EYE EXAM

## 2013-11-02 ENCOUNTER — Telehealth: Payer: Self-pay | Admitting: Family Medicine

## 2013-11-02 ENCOUNTER — Encounter: Payer: Self-pay | Admitting: Family Medicine

## 2013-11-02 ENCOUNTER — Ambulatory Visit: Payer: Medicare Other | Admitting: *Deleted

## 2013-11-02 NOTE — Telephone Encounter (Signed)
I faxed letter to Roxann per her request.

## 2013-11-02 NOTE — Telephone Encounter (Signed)
Pls call Roxann Ripple with [employer] and tell her that the letter for [place] is ready.  I'll put her business card on your keyboard for phone # info.-thx

## 2013-11-03 NOTE — Telephone Encounter (Signed)
Telephone encounter.

## 2013-11-06 ENCOUNTER — Ambulatory Visit (HOSPITAL_BASED_OUTPATIENT_CLINIC_OR_DEPARTMENT_OTHER)
Admission: RE | Admit: 2013-11-06 | Discharge: 2013-11-06 | Disposition: A | Discharge status: Home / Self Care | Payer: Medicare Other | Source: Ambulatory Visit | Attending: Emergency Medicine | Admitting: Emergency Medicine

## 2013-11-06 DIAGNOSIS — Z1231 Encounter for screening mammogram for malignant neoplasm of breast: Secondary | ICD-10-CM

## 2013-11-17 ENCOUNTER — Telehealth: Payer: Self-pay | Admitting: Family Medicine

## 2013-11-17 NOTE — Telephone Encounter (Signed)
Patient states there is a pharmacy in [facility] that continues to contact her for diabetic supplies. Patient has enough supplies, she does not need any refills. She also mentioned that she is supposed to get a insulin pump so she will probably need different supplies.

## 2013-11-17 NOTE — Telephone Encounter (Signed)
Noted  

## 2013-12-16 ENCOUNTER — Telehealth: Payer: Self-pay | Admitting: Family Medicine

## 2013-12-16 NOTE — Telephone Encounter (Signed)
Patient needs a letter to be sent to [employer] giving the date of her last appointment & what her A1C was. Send to [employer], Medical Review Unit, [address], [place], [place] [id number]. It has to be on our letterhead.

## 2013-12-16 NOTE — Telephone Encounter (Signed)
Before I do the letter, I want you to call the patient and ask a few questions. #1: does she have her insulin pump yet? #2: if yes, who is telling her the insulin dose settings to use? #3: if no, does she know what the hold up is? #4: is she still seeing the nutritionist? #5: has the nutritionist mentioned referral to endocrinologist (b/c I agreed to authorize pt to get insulin pump ONLY if pt went to get this managed by an endocrinologist, but I see no referral in her chart and the nutritionist seems to have made it sound like she would make this referral).  Does she want me to proceed with this referral? Let me know, then I'll write letter. By the way, she is overdue for recheck of her HbA1c (last check was 08/05/13 while in hospital). Just want to make sure things are managed the right way.---thx

## 2013-12-17 NOTE — Telephone Encounter (Signed)
LM with pt's husband.  She is out of town and he will let her know I called when he speaks with her.

## 2013-12-28 NOTE — Telephone Encounter (Signed)
Patient states that she does not have the insulin pump at this time.  They are awaiting "awards letter" from social security to process the order. It has been approved but now it is waiting for processing.  Patient has not been to nutritionist since April but she will continue with them once she gets pump and has plans to attend a 3 hour class informing her about insulin pumps.  She also hasn't heard anything about a referral to Endo but she is okay with referral.  Patient also can come in whenever you would like her to for A1C, she was going to just come into office in the am for lab but I wanted to make sure you didn't need anything else before drawing her blood.  Please advise.

## 2013-12-29 ENCOUNTER — Encounter: Payer: Self-pay | Admitting: Family Medicine

## 2013-12-29 NOTE — Telephone Encounter (Signed)
LMOM for pt to CB.  

## 2013-12-29 NOTE — Telephone Encounter (Signed)
OK, thanks. No A1c needed right now. Have her make a routine diabetes f/u o/v in 2 mo and we'll repeat it then. Send a note back to me reminding me to write her DMV letter and I'll leave it in my in-box and write it when I get back from [place].  thx

## 2013-12-30 ENCOUNTER — Encounter: Payer: Self-pay | Admitting: Family Medicine

## 2013-12-30 NOTE — Telephone Encounter (Signed)
LMOM # 2 for pt to CB.

## 2013-12-30 NOTE — Telephone Encounter (Signed)
Patient aware and scheduled appointment.  Patient is now requesting a letter to get out of jury duty due to her DM.  Please advise.

## 2013-12-31 NOTE — Telephone Encounter (Signed)
Noted. I'll have to think about that letter and get back to her about it.  No need to reply to her about this right now. If she needs the letter now I'm afraid it is not possible.-thx

## 2014-01-07 NOTE — Telephone Encounter (Signed)
Pls let pt know that unfortunately her diabetes is not a reason she can use to get out of jury duty. They will provide accomodations for her so that she has access to snacks in order to avoid hypoglycemia and she'll be able to take her meds appropriately.  Tell her sorry.--thx

## 2014-01-07 NOTE — Telephone Encounter (Signed)
Patient LMOM wanting to know if you wrote letter for [facility] yet.  Please advise.

## 2014-01-09 ENCOUNTER — Encounter: Payer: Self-pay | Admitting: Family Medicine

## 2014-01-09 NOTE — Telephone Encounter (Signed)
DMV letter printed.

## 2014-01-11 ENCOUNTER — Other Ambulatory Visit: Payer: Self-pay | Admitting: Family Medicine

## 2014-01-11 DIAGNOSIS — E1165 Type 2 diabetes mellitus with hyperglycemia: Principal | ICD-10-CM

## 2014-01-11 DIAGNOSIS — IMO0001 Reserved for inherently not codable concepts without codable children: Secondary | ICD-10-CM

## 2014-01-11 NOTE — Telephone Encounter (Signed)
Patient aware DMV letter is in the mail and that she will not be getting a letter to get out of [employer].

## 2014-01-11 NOTE — Telephone Encounter (Signed)
Letter addressed to [facility] per patient request.  Sent out with mail.

## 2014-01-12 ENCOUNTER — Telehealth: Payer: Self-pay | Admitting: Family Medicine

## 2014-01-12 NOTE — Telephone Encounter (Signed)
Please advise medication refill of novolog 2 bottles per month per patient.

## 2014-01-12 NOTE — Telephone Encounter (Signed)
Pt got insulin pump on Friday and she goes next Friday for training on how to use the pump but she needs two vials of novoLOG per month is all they told her.

## 2014-01-12 NOTE — Telephone Encounter (Signed)
Last time I saw pt 10/2013 I had her on Novolin 70/30--not novolog. Is she requesting RF of this (NOVOLIN 70/30)?   Or has she been using novolog (in which case I need to be updated on how many units she is injecting with each meal).. Or has she gotten her insulin pump now?  Let me know-thx

## 2014-01-13 MED ORDER — INSULIN ASPART 100 UNIT/ML ~~LOC~~ SOLN: SUBCUTANEOUS | Status: DC

## 2014-01-13 NOTE — Telephone Encounter (Signed)
OK, novolog vials sent to her pharmacy.

## 2014-01-18 ENCOUNTER — Ambulatory Visit: Payer: Medicare Other

## 2014-01-22 ENCOUNTER — Encounter: Payer: Medicare Other | Attending: Family Medicine | Admitting: *Deleted

## 2014-01-22 DIAGNOSIS — IMO0002 Reserved for concepts with insufficient information to code with codable children: Secondary | ICD-10-CM

## 2014-01-22 DIAGNOSIS — E1065 Type 1 diabetes mellitus with hyperglycemia: Secondary | ICD-10-CM

## 2014-01-22 NOTE — Progress Notes (Signed)
Insulin Pump Start Progress Note: 01/22/2014  Patient appointment start time: 1300  End time 1430  Patient here for insulin pump start on [employer] Revel pump and [place]et infusion set Orders with pump settings received from MD Patient completed Pre- training by   training books, return demonstration  Reviewed Pump Set Up including  Menu Settings  Bolus with Carb Ratio of 1 unit / 12 grams Carb, Correction Factor of 1 unit / 40 mg/dl  Suspend  Basal with initial Basal Rate of 1.00 units/hour  Reservoir Set Up  Utilities Pump Training Checklist completed Used Temp Basal of 3.5 hours duration @ 5 % basal due to patient taking their 70/30 insulin todayday at 7 AM  Patient will sign up for [employer] next Monday and agrees to upload as needed for review of progress and allow for pump setting adjustments  Patient successfully completed pump start and instructed to call Bev [place], RD, CDE  if BG drops below 80 mg/dl or goes above 300 mg/dl or as directed by MD  Follow up plan: 3 days for first change out, [facility] pump upload and evaluation of pump settings.  Patient also has appointment to see Dr. Wardell Heath, endocrinologist on February 04, 2014

## 2014-01-25 ENCOUNTER — Encounter: Payer: Medicare Other | Admitting: *Deleted

## 2014-01-25 DIAGNOSIS — E1165 Type 2 diabetes mellitus with hyperglycemia: Principal | ICD-10-CM

## 2014-01-25 DIAGNOSIS — IMO0001 Reserved for inherently not codable concepts without codable children: Secondary | ICD-10-CM

## 2014-01-25 NOTE — Progress Notes (Signed)
  Pump Follow Up Progress Note  Orders received from MD giving me permission to make insulin pump adjustments for the following patient.  Reviewed blood glucose logs on 01/25/14  via:  [facility] and Log Sheets and found the following:            Hypoglycemia Hyperglycemia Comments  Overnight Period:      Pre-Meal:    Breakfast      Lunch      Supper     Post-Meal: Breakfast  YES ICR   Lunch      Supper     Bedtime:       Comments: Patient states she is already feeling much better. No hypoglycemia since I adjusted Basal Rate down to 1.80 units per hour after the first night and she reported hypoglycemia of 60 mg/dl. She states she is comfortable with use of the Bolus Wizard and appears to be using it appropriately. She was able to change out the reservoir and infusion set successfully in my office today  Pump Settings: Date: Current Date: 01/25/14  Changes in bold print   Basal Rate: Carb Ratio Sensitivity  Basal Rate: Carb Ratio Sensitivity   MN: 0.800 1/12 40 MN: 0.800 MN: 10 (+) 40        11 A: 12                                                 Plan: Change out pump every 3 days  Thanks for picking up Ketone Strips Continue using Bolus Wizard for meal and correction doses Please upload to [facility] on Sunday evening and text me so I can see the reports on Monday You don't need to check BG at mid sleep time anymore Please continue to check before and 2 hours after meals and bedtime for now. Remember we changed your Carb Units from grams to Exchanges per your request    Follow up:  Patient to upload to [facility] within 7 days for further review

## 2014-01-25 NOTE — Patient Instructions (Signed)
Plan: Thanks for picking up Ketone Strips Continue using Bolus Wizard for meal and correction doses Please upload to [facility] on Sunday evening and text me so I can see the reports on Monday You don't need to check BG at mid sleep time anymore Please continue to check before and 2 hours after meals and bedtime for now. Remember we changed your Carb Units from grams to Exchanges per your request

## 2014-01-26 ENCOUNTER — Encounter: Payer: Self-pay | Admitting: Family Medicine

## 2014-02-02 ENCOUNTER — Telehealth: Payer: Self-pay | Admitting: *Deleted

## 2014-02-02 NOTE — Telephone Encounter (Signed)
  Pump Follow Up Progress Note  Orders received from MD giving me permission to make insulin pump adjustments for the following patient.  Reviewed blood glucose logs on 02/02/14  via:  [facility] and Log Sheets and found the following:            Hypoglycemia Hyperglycemia Comments  Overnight Period:   YES BASAL  Pre-Meal:    Breakfast      Lunch  YES BASAL   Supper  YES BASAL  Post-Meal: Breakfast      Lunch  YES ICR   Supper  YES ICR  Bedtime:   YES BASAL   Comments: No hypoglycemia in past 7 days. Average BG up to 214 +/- 78 mg/dl. Post Lunch and Supper BG's more than 40 mg/dl increase, and Correction Bolus is not bringing her back to Target Range. I plan to increase Basal Rate by 10% for 24 hours, increase Carb ratio for Lunch and Supper and increase ISF by 10 %. NOTE: patient counts her carbs by Carb Choices (or Exchanges) so her ICR is 1.5 units per Carb Choice or Exchange which equals 1/10 grams carbohydrate.  Pump Settings: Date: Current Date: 02/02/14  Changes in bold print   Basal Rate: Carb Ratio Sensitivity  Basal Rate: Carb Ratio Sensitivity   MN: 0.800 MN: 1/10 40 MN: 0.900 (+) MN: 10 (+) 35  (+)    11 A: 1/12    11 A: 10 (+)                                                 Plan: Change out pump every 3 days  Continue using Bolus Wizard for meal and correction doses Remember we changed your Carb Units from grams to Exchanges per your request   Follow up: Patient to upload to [facility] within 7 days for further review Patient has first appointment with Dr. Cruzita Lederer this Thursday, 02/04/14

## 2014-02-04 ENCOUNTER — Ambulatory Visit: Payer: Medicare Other | Admitting: Internal Medicine

## 2014-02-16 ENCOUNTER — Encounter: Payer: Self-pay | Admitting: Family Medicine

## 2014-02-16 ENCOUNTER — Ambulatory Visit (INDEPENDENT_AMBULATORY_CARE_PROVIDER_SITE_OTHER): Payer: Medicare Other | Admitting: Family Medicine

## 2014-02-16 VITALS — BP 120/86 | HR 78 | Temp 98.9°F | Resp 18 | Ht 67.5 in | Wt 228.0 lb

## 2014-02-16 DIAGNOSIS — E1165 Type 2 diabetes mellitus with hyperglycemia: Principal | ICD-10-CM

## 2014-02-16 DIAGNOSIS — E785 Hyperlipidemia, unspecified: Secondary | ICD-10-CM

## 2014-02-16 DIAGNOSIS — IMO0001 Reserved for inherently not codable concepts without codable children: Secondary | ICD-10-CM

## 2014-02-16 DIAGNOSIS — N920 Excessive and frequent menstruation with regular cycle: Secondary | ICD-10-CM

## 2014-02-16 DIAGNOSIS — D509 Iron deficiency anemia, unspecified: Secondary | ICD-10-CM

## 2014-02-16 NOTE — Progress Notes (Signed)
OFFICE NOTE  02/16/2014  CC:  Chief Complaint  Patient presents with  . Follow-up   HPI: Patient is a 48 y.o. Caucasian female who is here for DM f/u, last seen here about 4 mo ago. Has first consult visit with endocrinologist 02/19/14, is already on insulin pump now for about a month, changes being made via the dietitian.  She is feeling a lot better and her overall control is improved.  Energy level much improved. She is fasting today.   Vag bleeding is the same as usual.  Still taking iron twice per day.   Pertinent PMH:  Past medical, surgical, social, and family history reviewed and no changes are noted since last office visit.  MEDS:  Outpatient Prescriptions Prior to Visit  Medication Sig Dispense Refill  . clonazePAM (KLONOPIN) 1 MG tablet Take 1-2 tablets (1-2 mg total) by mouth 3 (three) times daily. 1 tab a.m. 1 tab at 2p and 2 tabs at bedtime  120 tablet  5  . EPINEPHrine (EPI-PEN) 0.3 mg/0.3 mL DEVI Inject 0.3mg  into thigh muscle once in case of severe allergic reaction, then proceed to nearest ER.  0.3 mL  0  . ferrous sulfate 325 (65 FE) MG EC tablet Take 1 tablet (325 mg total) by mouth 2 (two) times daily.      . fluticasone (FLONASE) 50 MCG/ACT nasal spray       . insulin aspart (NOVOLOG) 100 UNIT/ML injection Use as instructed via insulin pump  20 mL  2  . metFORMIN (GLUCOPHAGE) 500 MG tablet 1 tab po tid  90 tablet  6  . venlafaxine XR (EFFEXOR-XR) 150 MG 24 hr capsule Take 2 capsules (300 mg total) by mouth daily.  180 capsule  4  . Alcohol Swabs (PHARMACIST CHOICE ALCOHOL) PADS Test 6 times daily  600 each  2  . FREESTYLE LITE test strip Test 6 times daily  550 each  2  . PHARMACIST CHOICE LANCETS MISC Test 6 times daily  600 each  2  . insulin NPH-regular Human (NOVOLIN 70/30) (70-30) 100 UNIT/ML injection 40 U qAM and 18 U qSupper  10 mL  11   No facility-administered medications prior to visit.    PE: Blood pressure 120/86, pulse 78, temperature 98.9 F  (37.2 C), temperature source Temporal, resp. rate 18, height 5' 7.5" (1.715 m), weight 228 lb (103.42 kg), last menstrual period 02/13/2014, SpO2 99.00%. Gen: Alert, well appearing.  Patient is oriented to person, place, time, and situation. AFFECT: pleasant, lucid thought and speech. No further exam today.  LABS:  Lab Results  Component Value Date   HGBA1C 10.4* 08/05/2013   Lab Results  Component Value Date   CHOL 173 08/06/2013   HDL 37* 08/06/2013   LDLCALC 102* 08/06/2013   LDLDIRECT 167.1 04/02/2013   TRIG 169* 08/06/2013   CHOLHDL 4.7 08/06/2013   Lab Results  Component Value Date   WBC 7.7 09/15/2013   HGB 10.3* 09/15/2013   HCT 33.5* 09/15/2013   MCV 76.5* 09/15/2013   PLT 444.0* 09/15/2013     Chemistry      Component Value Date/Time   NA 138 08/17/2013 1448   K 4.2 08/17/2013 1448   CL 104 08/17/2013 1448   CO2 25 08/17/2013 1448   BUN 15 08/17/2013 1448   CREATININE 0.9 08/17/2013 1448      Component Value Date/Time   CALCIUM 9.1 08/17/2013 1448   ALKPHOS 163* 08/17/2013 1448   AST 30 08/17/2013 1448  ALT 30 08/17/2013 1448   BILITOT 0.8 08/17/2013 1448      IMPRESSION AND PLAN:  1) DM 2, on insulin pump and doing better. Recheck A1c today. Has appt with Dr. Cruzita Lederer on 02/19/14.  2) Hyperlipidemia: didn't start statin last cholesterol check b/c of wanting to focus on getting diabetic control improved, then see where cholesterol was after that.  Will check fasting lipids today and will likely get her started on statin.  3) Menorrhagia with iron def anemia, stable.  Continue current iron dosing.  An After Visit Summary was printed and given to the patient.  FOLLOW UP: 6 mo

## 2014-02-16 NOTE — Progress Notes (Signed)
Pre visit review using our clinic review tool, if applicable. No additional management support is needed unless otherwise documented below in the visit note. 

## 2014-02-17 LAB — LIPID PANEL
Cholesterol: 196 mg/dL (ref 0–200)
HDL: 58.1 mg/dL (ref >39.00)
LDL Cholesterol: 128 mg/dL — ABNORMAL HIGH (ref 0–99)
NonHDL: 137.9
TRIGLYCERIDES: 48 mg/dL (ref 0.0–149.0)
Total CHOL/HDL Ratio: 3
VLDL: 9.6 mg/dL (ref 0.0–40.0)

## 2014-02-17 LAB — HEMOGLOBIN A1C: HEMOGLOBIN A1C: 10 % — AB (ref 4.6–6.5)

## 2014-02-19 ENCOUNTER — Ambulatory Visit (INDEPENDENT_AMBULATORY_CARE_PROVIDER_SITE_OTHER): Payer: Medicare Other | Admitting: Internal Medicine

## 2014-02-19 ENCOUNTER — Encounter: Payer: Self-pay | Admitting: Internal Medicine

## 2014-02-19 ENCOUNTER — Other Ambulatory Visit: Payer: Self-pay | Admitting: *Deleted

## 2014-02-19 ENCOUNTER — Other Ambulatory Visit: Payer: Self-pay | Admitting: Family Medicine

## 2014-02-19 VITALS — BP 110/68 | HR 85 | Temp 98.2°F | Resp 12 | Ht 67.0 in | Wt 223.6 lb

## 2014-02-19 DIAGNOSIS — IMO0002 Reserved for concepts with insufficient information to code with codable children: Secondary | ICD-10-CM

## 2014-02-19 DIAGNOSIS — E1065 Type 1 diabetes mellitus with hyperglycemia: Secondary | ICD-10-CM

## 2014-02-19 MED ORDER — GLUCOSE BLOOD VI STRP: ORAL_STRIP | Status: DC

## 2014-02-19 MED ORDER — ATORVASTATIN CALCIUM 40 MG PO TABS: 40.0000 mg | ORAL_TABLET | Freq: Every day | ORAL | Status: DC

## 2014-02-19 MED ORDER — GLUCAGON (RDNA) 1 MG IJ KIT: 1.0000 mg | PACK | Freq: Once | INTRAMUSCULAR | Status: DC | PRN
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
## 2014-02-19 NOTE — Patient Instructions (Addendum)
Please make the following changes in the pump settings: - basal rates:  12 am: 1.0 >> 1.1 units/h 6 am: 1.2 units/h - please increase to 1.3 units/h if sugars in am not improved in 4 days (only if you have no low sugars)  12 pm: 1.0 >> 1.1 units/h - ICR:  12 am: 7.5 5 pm: 10 - target: 100-120 - ISF: 30 - Insulin on Board: 4h Please bolus for snacks, too.  Please return in 1 month with your sugar log.   Basic Rules for Patients with Type I Diabetes Mellitus  1. The American Diabetes Association (ADA) recommended targets: - fasting sugar <130 - after meal sugar <180 - HbA1C <7%  2. Engage in ?150 min moderate exercise per week  3. Make sure you have ?8h of sleep every night as this helps both blood sugars and your weight.  4. Always keep a sugar log (not only record in your meter) and bring it to all appointments with [place].  5. If you are on a pump, know how to access the settings and to modify the parameters.  6.  Remember, you can always call the number on the back of the pump for emergencies related to the pump.  7. "15-15 rule" for hypoglycemia: if sugars are low, take 15 g of carbs** ("fast sugar" - e.g. 4 glucose tablets, 4 oz orange juice), wait 15 min, then check sugars again. If still <80, repeat. Continue  until your sugars >80, then eat a normal meal.   8. Teach family members and coworkers to inject glucagon. Have a glucagon set at home and one at work. They should call 911 after using the set.  9. If you are on a pump, set "insulin on board" time for 5 hours (if your sugars tend to be higher, can use 4 hours).   10. If you are on a pump, use the "dual wave bolus" setting for high fat foods (e.g. pizza). Start with a setting of 50%-50% (50% instant bolus and 50% prolonged bolus over 3h, for e.g.).    11. If you are on a pump, make sure the basal daily insulin dose is approximately equal (not larger) to the daily insulin you get from boluses, otherwise you are at risk  for hypoglycemia.  12. Check sugar before driving. If <100, correct, and only start driving if sugars rise ?100. Check sugar every hour when on a long drive.  13. Check sugar before exercising. If <100, correct, and only start exercising if sugars rise ?100. Check sugar every hour when on a long exercise routine and 1h after you finished exercising.   If >250, check urine for ketones. If you have moderate-large ketones in urine, do not start exercise. Hydrate yourself with clear liquids and correct the high sugar. Recheck sugars and ketones before attempting to exercise.  Be aware that you might need less insulin when exercising.  *intense, short, exercise bursts can increase your sugars, but  *less intense, longer (>1h), exercise routines can decrease your sugars.  If you are on a pump, you might need to decrease your basal rate by 10% or more (or even disconnect your pump) while you exercise to prevent low sugars. Do not disconnect your pump by more than 3 hours at a time! You also might need to decrease your insulin bolus for the meal prior to your exercise time by 20% or more.  14. Make sure you have a MedAlert bracelet or pendant mentioning "Type I Diabetes Mellitus".  If you have a prior episode of severe hypoglycemia or hypoglycemia unawareness, it should also mention this.  15. Please do not walk barefoot. Inspect your feet for sores/cuts and let us know if you have them.  16. Please call [facility] with any questions and concerns [phone]).   **E.g. of "fast carbs":   first choice (15 g):  1 tube glucose gel, GlucoPouch 15, 2 oz glucose liquid   second choice (15-16 g):  3 or 4 glucose tablets (best taken  with water), 15 Dextrose Bits chewable   third choice (15-20 g):   cup fruit juice,  cup regular soda, 1 cup skim milk,  1 cup sports drink   fourth choice (15-20 g):  1 small tube [place] gel (not frosting), 2 tbsp raisins, 1 tbsp table sugar,   candy, jelly beans, gum drops - check package for carb amount   (adapted from: Lenice Pressman. "Insulin therapy and hypoglycemia" Endocrinol Metab Clin N Am 2012, 41: 57-87)

## 2014-03-04 ENCOUNTER — Ambulatory Visit: Payer: Medicare Other | Admitting: *Deleted

## 2014-03-17 ENCOUNTER — Telehealth: Payer: Self-pay | Admitting: *Deleted

## 2014-03-17 NOTE — Telephone Encounter (Signed)
Per patient's schedule of appointments, our last appointment on 03/04/14 was cancelled. Her next appointment with Dr. Cruzita Lederer is scheduled for 04/06/14. I reviewed her [facility] pump reports and feel she would benefit from a visit to review her pump settings and to continue her education on pump features, especially the Extended Bolus. I texted her back suggesting a follow up visit either before her visit with Dr. Cruzita Lederer or a month after that visit.

## 2014-03-18 ENCOUNTER — Telehealth: Payer: Self-pay | Admitting: *Deleted

## 2014-03-18 NOTE — Telephone Encounter (Deleted)
Test note for training only

## 2014-03-25 ENCOUNTER — Encounter: Payer: Medicare Other | Attending: Family Medicine | Admitting: *Deleted

## 2014-03-25 DIAGNOSIS — E1065 Type 1 diabetes mellitus with hyperglycemia: Secondary | ICD-10-CM

## 2014-03-25 DIAGNOSIS — Z9641 Presence of insulin pump (external) (internal): Secondary | ICD-10-CM | POA: Insufficient documentation

## 2014-03-25 DIAGNOSIS — IMO0002 Reserved for concepts with insufficient information to code with codable children: Secondary | ICD-10-CM | POA: Insufficient documentation

## 2014-03-25 NOTE — Progress Notes (Signed)
  Pump Follow Up Progress Note: 03/25/14  Start time 7:30 End time: 8:30  Orders received from MD giving me permission to make insulin pump adjustments for the following patient.  Reviewed blood glucose logs on 03/25/14  via:  [facility] and Log Sheets and found the following:            Hypoglycemia Hyperglycemia Comments  Overnight Period:      Pre-Meal:    Breakfast      Lunch      Supper  YES BASAL  Post-Meal: Breakfast  YES ICR   Lunch      Supper     Bedtime:       Comments: Patient states she continues to feel much better. Very little hypoglycemia and when it occurs she states it is due to increased activity.  Average BG over past 2 weeks @ 194 +/- 93 mg/dl. Post breakfast is climbing more than 40 mg/dl and afternoon BG's continue to be too high. Plan to increase ICR for Breakfast meal and to increase Basal Rate by 10% from noon to 6 PM.She states she is comfortable with use of the Bolus Wizard and appears to be using it appropriately. Taught her to use Temp Basal for times she is more active.  Pump Settings: Date: Current Date: 03/25/14  Changes in bold print   Basal Rate: Carb Ratio Sensitivity  Basal Rate: Carb Ratio Sensitivity   MN: 1.10 MN: 7.5 30 MN: 1.10 MN: 6.5 (+) 30  6 A 1.20 11 A: 7.5  6 A:  1.20 11 A: 7.5   12 N 1.10 5 P: 10  6 P: 1.10 5 P: 10                                        Plan: Continue using Bolus Wizard for meal and correction doses Please continue to check BG before each meal and bedtime. Remember we changed your Carb Units from grams to Exchanges per your request Today: We increased your Carb Ratio at Breakfast today to help improve your mid morning BG's We increased your Basal Rate from noon to 6 PM to improve your afternoon and pre-supper BG's. Consider using Temp Basal when you are more active to prevent hypoglycemia We increased your Max Bolus from 15 to 20 units to make sure you  Consider using Temp Basal for times when you are more physically  active to help prevent low BG      Follow up:  Patient to upload to [facility] as needed for further review. Next pump follow up visit planned in 3 months.

## 2014-03-25 NOTE — Patient Instructions (Signed)
Plan: Continue using Bolus Wizard for meal and correction doses Please continue to check BG before each meal and bedtime. Remember we changed your Carb Units from grams to Exchanges per your request Today: We increased your Carb Ratio at Breakfast today to help improve your mid morning BG's We increased your Basal Rate from noon to 6 PM to improve your afternoon and pre-supper BG's. We increased your Max Bolus from 15 to 20 units to make sure you  Consider using Temp Basal for times when you are more physically active to help prevent low BG

## 2014-04-05 ENCOUNTER — Other Ambulatory Visit: Payer: Self-pay

## 2014-04-05 MED ORDER — ATORVASTATIN CALCIUM 40 MG PO TABS: 40.0000 mg | ORAL_TABLET | Freq: Every day | ORAL | Status: DC

## 2014-04-06 ENCOUNTER — Ambulatory Visit: Payer: Medicare Other | Admitting: Internal Medicine

## 2014-04-13 ENCOUNTER — Ambulatory Visit: Payer: Medicare Other | Admitting: Internal Medicine

## 2014-04-15 ENCOUNTER — Telehealth: Payer: Self-pay | Admitting: Internal Medicine

## 2014-04-15 ENCOUNTER — Other Ambulatory Visit: Payer: Self-pay | Admitting: *Deleted

## 2014-04-15 MED ORDER — INSULIN ASPART 100 UNIT/ML ~~LOC~~ SOLN: SUBCUTANEOUS | Status: DC

## 2014-04-15 NOTE — Telephone Encounter (Signed)
Patient need refill of Novalog need 3 vials, pump is getting low.

## 2014-04-15 NOTE — Telephone Encounter (Signed)
Done

## 2014-04-16 ENCOUNTER — Ambulatory Visit: Payer: Medicare Other | Admitting: Family Medicine

## 2014-04-19 ENCOUNTER — Ambulatory Visit: Payer: Medicare Other

## 2014-05-03 ENCOUNTER — Telehealth: Payer: Self-pay | Admitting: *Deleted

## 2014-05-03 NOTE — Telephone Encounter (Signed)
  Telephone call  Progress Note: 05/03/14    Orders received from MD giving me permission to make insulin pump adjustments for the following patient.  Reviewed blood glucose logs on 05/03/14  via:  [facility] and Log Sheets and found the following:            Hypoglycemia Hyperglycemia Comments  Overnight Period:   YES BASAL  Pre-Meal:    Breakfast  YES BASAL   Lunch  YES BASAL   Supper  YES BASAL  Post-Meal: Breakfast      Lunch      Supper  YES ICR  Bedtime:       Comments: Patient states she is noticing her BG's are too high again. Average over last 2 weeks per [facility] Reports 225 +/- 91 mg/dl. Post supper also going too high. Plan to increase all Basal Rates by 10% and MN to 6 AM even more. Also plan to increase ICR at supper per below  Pump Settings: Date: Current Date: 05/03/14  Changes in bold print   Basal Rate: Carb Ratio Sensitivity  Basal Rate: Carb Ratio Sensitivity   MN: 1.10 MN: 7.5 30 MN: 1.30 (+) MN: 6.5  30  6  A 1.20 11 A: 7.5  6 A:  1.30 (+) 11 A: 7.5   12 N 1.10 5 P: 10  6 P: 1.20 (+) 5 P: 7.5 (+)                                        Plan: Continue using Bolus Wizard for meal and correction doses Remember we changed your Carb Units from grams to Exchanges per your request Today: We increased your Carb Ratio at Supper to help improve your evening BG's We increased your Basal Rate for all 24 hours Consider using Temp Basal for times when you are more physically active to help prevent low BG      Follow up:  Patient to upload to [facility] as needed for further review. Next pump follow up visit planned within 3 months.

## 2014-05-12 ENCOUNTER — Ambulatory Visit: Payer: Medicare Other | Admitting: Internal Medicine

## 2014-05-20 ENCOUNTER — Telehealth: Payer: Self-pay | Admitting: Family Medicine

## 2014-05-20 MED ORDER — CLONAZEPAM 1 MG PO TABS: ORAL_TABLET | ORAL | Status: DC

## 2014-05-20 NOTE — Telephone Encounter (Signed)
rx faxed

## 2014-05-20 NOTE — Telephone Encounter (Signed)
Pt requesting RF of clonazepam.  Last OV was 02/16/14.  Last RX printed 10/15/13 x 5 rfs.  Please advise.

## 2014-05-20 NOTE — Telephone Encounter (Signed)
Clonazepam rx printed. 

## 2014-05-31 ENCOUNTER — Ambulatory Visit (INDEPENDENT_AMBULATORY_CARE_PROVIDER_SITE_OTHER): Payer: Medicare Other | Admitting: Family Medicine

## 2014-05-31 ENCOUNTER — Ambulatory Visit: Payer: Medicare Other

## 2014-05-31 DIAGNOSIS — Z23 Encounter for immunization: Secondary | ICD-10-CM

## 2014-06-03 ENCOUNTER — Other Ambulatory Visit: Payer: Self-pay | Admitting: Internal Medicine

## 2014-06-03 ENCOUNTER — Ambulatory Visit (INDEPENDENT_AMBULATORY_CARE_PROVIDER_SITE_OTHER): Payer: Medicare Other | Admitting: Internal Medicine

## 2014-06-03 ENCOUNTER — Encounter: Payer: Self-pay | Admitting: Internal Medicine

## 2014-06-03 VITALS — BP 108/76 | HR 106 | Temp 98.0°F | Resp 12 | Wt 235.0 lb

## 2014-06-03 DIAGNOSIS — IMO0002 Reserved for concepts with insufficient information to code with codable children: Secondary | ICD-10-CM

## 2014-06-03 DIAGNOSIS — E1065 Type 1 diabetes mellitus with hyperglycemia: Secondary | ICD-10-CM

## 2014-06-03 NOTE — Progress Notes (Signed)
Patient ID: Kristie [name], female   DOB: 01-Dec-1965, 48 y.o.   MRN: [id number]  HPI: Kristie [name] is a 48 y.o.-year-old female, returning for follow-up for DM1, dx 2001 (GDM), uncontrolled, without complications.  She is very hungry >> eats ice.She is anemic.  She changed her eating habits >> eats smaller portions, more salads; She also exercise 30 min a day.  Last hemoglobin A1c was: Lab Results  Component Value Date   HGBA1C 10.0* 02/16/2014   HGBA1C 10.4* 08/05/2013   HGBA1C 11.5* 04/02/2013   Pt is on an insulin pump: Medtronic 723 x 1 mo, without CGM, uses Novolog in the pump.  She is also on Metformin 500 mg tid (diarrhea). Pump settings: - basal rates:  12 am: 1.0 >> 1.1 >> 1.3 6 am: 1.2 >> 1.3  12 pm: 1.0 >> 1.1 >> 1.2 - ICR:  12 am: 7.5 >> 7.0 11 am: 7.5 5 pm: 10 >> 7.5 - target: 100-120 - ISF: 30 - Insulin on Board: 4h - extended bolusing: not using - changes infusion site: q3 days - Meter: Bayer Contour Basal insulin tdd 52% (30.4 units) Bolus insulin tdd 48% (27.8 units)  She was only bolusing for meals, so, at last visit, I advised her to also bolus for snacks. She is now doing this most of the time.  Pt checks her sugars 6-8 a day and they are still high especially in am- ave 217 +/-68:  - am: 133-293 >> 97, 197-251, 296 - 2h after b'fast: 150-395 >> 65, 129-181 - before lunch: 165-240 >> 128-266 - 2h after lunch: 109-337 >> 80, 201-273 - before dinner: 55 x1 (very busy that day), 133-275 >> 78, 172-289 - 2h after dinner: 127, 213-345 >> 96, 163-243 - bedtime: 90, 213, 301 >> 208-220 - nighttime: 261 >> [id number] He does  upload her sugars. No lows. Lowest sugar was 37 >> 65; she has hypoglycemia awareness at 60s. No previous hypoglycemia admission.  Highest sugar was HI >> 350. Had 1x previous DKA admissions: admitted for 10 days (in 07/2013).    She is seeing [name] in DM education.  Pt's meals are: - Breakfast: 2 eggs, toast, cheese -  Lunch: sandwich - Dinner: backed chicken or porkchops - Snacks: yoghurt, fruit Walks for exercise 2x a week  - no CKD, last BUN/creatinine:  Lab Results  Component Value Date   BUN 15 08/17/2013   CREATININE 0.9 08/17/2013  Not on an ACEI. - last set of lipids: Lab Results  Component Value Date   CHOL 196 02/16/2014   HDL 58.10 02/16/2014   LDLCALC 128* 02/16/2014   LDLDIRECT 167.1 04/02/2013   TRIG 48.0 02/16/2014   CHOLHDL 3 02/16/2014  Not on a statin. - last eye exam was in 10/29/2013. No DR.  - + numbness and tingling in her feet (mostly big toes)  Last TSH was normal: Lab Results  Component Value Date   TSH 0.80 04/02/2013   I reviewed pt's medications, allergies, PMH, social hx, family hx and no changes required, except as mentioned above.  ROS: Constitutional: + weight gain, + increased appetite, + fatigue, + subjective hypothermia Eyes: no blurry vision, no xerophthalmia ENT: no sore throat, no nodules palpated in throat, no dysphagia/odynophagia, no hoarseness Cardiovascular: no CP/SOB/palpitations/+ leg swelling Respiratory: no cough/SOB Gastrointestinal: no N/V/D/C Musculoskeletal: no muscle/joint aches Skin: no rashes, + itching Neurological: no tremors/numbness/tingling/dizziness + low libido  PE: BP 108/76 mmHg  Pulse 106  Temp(Src) 98 F (36.7 C) (  Oral)  Resp 12  Wt 235 lb (106.595 kg)  SpO2 95% Body mass index is 36.8 kg/(m^2).  Wt Readings from Last 3 Encounters:  06/03/14 235 lb (106.595 kg)  02/19/14 223 lb 9.6 oz (101.424 kg)  02/16/14 228 lb (103.42 kg)   Constitutional: overweight, in NAD Eyes: PERRLA, EOMI, no exophthalmos ENT: moist mucous membranes, no thyromegaly, no cervical lymphadenopathy Cardiovascular: RRR, No MRG Respiratory: CTA B Gastrointestinal: abdomen soft, NT, ND, BS+ Musculoskeletal: no deformities, strength intact in all 4 Skin: moist, warm, no rashes Neurological: no tremor with outstretched hands, DTR normal  in all 4  ASSESSMENT: 1. DM1, uncontrolled, without complications (?PN) - on pump Component     Latest Ref Rng 09/24/2013  Glucose, Fasting     70 - 99 mg/dL 121 (H)  C-Peptide     0.80 - 3.90 ng/mL <0.10 (L)   PLAN:  1. Patient with long-standing, uncontrolled DM1, on insulin therapy. Sugars higher overnight and in am and remain high all day.  - We discussed about changes to his insulin regimen, as follows >> increase basal rates and decrease CBG target:  Patient Instructions  Please stop at [facility] lab downstairs. Stop Metformin. Please change the pump settings as follows: - basal rates:  12 am: 1.3 >> 1.4 >> please increase to 1.5 if sugars in the middle of the night are still high 6 am: 1.3 >> 1.5  12 pm: 1.2 >> 1.4 - ICR:  12 am: 7.0 11 am: 7.5 5 pm: 7.5 - target: 100-120 >> 100-110 - ISF: 30 - Insulin on Board: 4h Please change your breakfast as we discussed. Please return in 1.5 months.   - continue checking sugars at different times of the day - check at least 4 times a day, rotating checks - advised for yearly eye exams > she is up to date - had flu vaccine this season - already seeing DM education  - check HbA1c today - we discussed about improving her diet, especially to avoid liquid calories for b'fast as she will then be hungry all day >> given specific examples of a healthy b'fast - no signs of other autoimmune disorders - check TSH today - Return to clinic in 1.5 mo with sugar log   - time spent with the patient: 40 min, of which >50% was spent in reviewing her pump download, discussing her hypo- and hyper-glycemic episodes, reviewing her previous labs and pump settings and developing a plan to avoid hypo- an hyper-glycemia.   Orders Only on 06/03/2014  Component Date Value Ref Range Status  . TSH 06/03/2014 1.308  0.350 - 4.500 uIU/mL Final  . Hgb A1c MFr Bld 06/03/2014 9.8* <5.7 % Final   Comment:                                                                         According to the ADA Clinical Practice Recommendations for 2011, when HbA1c is used as a screening test:     >=6.5%   Diagnostic of Diabetes Mellitus            (if abnormal result is confirmed)   5.7-6.4%   Increased risk of developing Diabetes Mellitus   References:Diagnosis and Classification of Diabetes  [place],Diabetes FSFS,2395,32(YEBXI 1):S62-S69 and Standards of Medical Care in         Diabetes - 2011,Diabetes DHWY,6168,37 (Suppl 1):S11-S61.     . Mean Plasma Glucose 06/03/2014 235* <117 mg/dL Final   TSH is normal and HbA1c is a little better.

## 2014-06-03 NOTE — Patient Instructions (Addendum)
Please stop at [facility] lab downstairs. Stop Metformin. Please change the pump settings as follows: - basal rates:  12 am: 1.3 >> 1.4 >> please increase to 1.5 if sugars in the middle of the night are still high 6 am: 1.3 >> 1.5  12 pm: 1.2 >> 1.4 - ICR:  12 am: 7.0 11 am: 7.5 5 pm: 7.5 - target: 100-120 >> 100-110 - ISF: 30 - Insulin on Board: 4h Please change your breakfast as we discussed. Please return in 1.5 months.   Please consider the following ways to cut down carbs and fat and increase fiber and micronutrients in your diet:  - substitute whole grain for white bread or pasta - substitute brown rice for white rice - substitute 90-calorie flat bread pieces for slices of bread when possible - substitute sweet potatoes or yams for white potatoes - substitute humus for margarine - substitute tofu for cheese when possible - substitute almond or rice milk for regular milk (would not drink soy milk daily due to concern for soy estrogen influence on breast cancer risk) - substitute dark chocolate for other sweets when possible - substitute water - can add lemon or orange slices for taste - for diet sodas (artificial sweeteners will trick your body that you can eat sweets without getting calories and will lead you to overeating and weight gain in the long run) - do not skip breakfast or other meals (this will slow down the metabolism and will result in more weight gain over time)  - can try smoothies made from fruit and almond/rice milk in am instead of regular breakfast - can also try old-fashioned (not instant) oatmeal made with almond/rice milk in am - order the dressing on the side when eating salad at a restaurant (pour less than half of the dressing on the salad) - eat as little meat as possible - can try juicing, but should not forget that juicing will get rid of the fiber, so would alternate with eating raw veg./fruits or drinking smoothies - use as little oil as possible,  even when using olive oil - can dress a salad with a mix of balsamic vinegar and lemon juice, for e.g. - use agave nectar, stevia sugar, or regular sugar rather than artificial sweateners - steam or broil/roast veggies  - snack on veggies/fruit/nuts (unsalted, preferably) when possible, rather than processed foods - reduce or eliminate aspartame in diet (it is in diet sodas, chewing gum, etc) Read the labels! Try to read Dr. Janene Harvey book: "Program for Reversing Diabetes" for the vegan concept and other ideas for healthy eating.

## 2014-06-04 LAB — HEMOGLOBIN A1C
HEMOGLOBIN A1C: 9.8 % — AB (ref <5.7)
Mean Plasma Glucose: 235 mg/dL — ABNORMAL HIGH (ref <117)

## 2014-06-04 LAB — TSH: TSH: 1.308 u[IU]/mL (ref 0.350–4.500)

## 2014-06-24 ENCOUNTER — Encounter: Payer: Medicare Other | Attending: Family Medicine | Admitting: *Deleted

## 2014-06-24 VITALS — Ht 67.5 in | Wt 236.9 lb

## 2014-06-24 DIAGNOSIS — Z713 Dietary counseling and surveillance: Secondary | ICD-10-CM | POA: Diagnosis not present

## 2014-06-24 DIAGNOSIS — Z794 Long term (current) use of insulin: Secondary | ICD-10-CM | POA: Insufficient documentation

## 2014-06-24 DIAGNOSIS — E1065 Type 1 diabetes mellitus with hyperglycemia: Secondary | ICD-10-CM | POA: Diagnosis not present

## 2014-06-24 DIAGNOSIS — IMO0002 Reserved for concepts with insufficient information to code with codable children: Secondary | ICD-10-CM

## 2014-06-24 NOTE — Patient Instructions (Signed)
Plan: Continue using Bolus Wizard for meal and correction doses Please continue to check BG before each meal and bedtime. Stop by the [facility] and find out what classes including Water Exercises that would work for you  Consider checking your BG before the class and after so you can see the benefit

## 2014-06-24 NOTE — Progress Notes (Signed)
  Telephone call  Progress Note: 06/24/14    Orders received from MD giving me permission to make insulin pump adjustments for the following patient.  Reviewed blood glucose logs on 06/24/14  via:  [facility] and Log Sheets and found the following:            Hypoglycemia Hyperglycemia Comments  Overnight Period:   YES BASAL  Pre-Meal:    Breakfast  YES BASAL   Lunch  YES BASAL   Supper  YES BASAL  Post-Meal: Breakfast      Lunch      Supper  YES ICR  Bedtime:       Comments: Patient states Dr. Cruzita Lederer is making adjustments to her pump settings at each visit. Her average BG over past 2 weeks is 231 +/- 98 mg/dl. Upon review of her TDD, her ICR and ISF are appropriate but her Bolus insulin is only 35% of her TDD indicating she may not be bolusing for all of her food intake or enough for her BG's. She states she is joining the [employer] so I have strongly encouraged her to increase her activity level as the next method for improving her control. Even though she walks inside her house, we both agree she would benefit from getting out of the house and joining a group exercise class of her choice.   Pump Settings: Date: Current Date: 06/24/14  No Changes today   Basal Rate: Carb Ratio Sensitivity  Basal Rate: Carb Ratio Sensitivity   MN: 1.40 MN: 7.0 30 MN: 1.40 MN: 7.0  30  6 A 1.50 11 A: 7.5  6 A:  1.50 11 A: 7.5   12 N 1.40 5 P: 7.5  12 N 1.40 5 P: 7.5                                         Plan: Continue using Bolus Wizard for meal and correction doses Please continue to check BG before each meal and bedtime. Stop by the [facility] and find out what classes including Water Exercises that would work for you  Consider checking your BG before the class and after so you can see the benefit   Follow up:  Patient to upload to [facility] as needed for further review. Next pump follow up visit planned within 3 months.

## 2014-07-19 ENCOUNTER — Telehealth: Payer: Self-pay | Admitting: Family Medicine

## 2014-07-19 DIAGNOSIS — D509 Iron deficiency anemia, unspecified: Secondary | ICD-10-CM

## 2014-07-19 NOTE — Telephone Encounter (Signed)
This sounds like symptoms from her anemia.  Is she taking her iron pill?  She should be taking ferrous sulfate 325mg  at least once a day (if not twice a day). She has a long history of iron deficiency anemia from her excessive menstrual blood loss. She should come in for lab visit for CBC, IBC panel, and ferritin (dx iron def anemia).-thx

## 2014-07-19 NOTE — Telephone Encounter (Signed)
Patient states that she had lab work and never heard from Endo but pts nutritionist told her to contact Dr. Anitra Lauth because she is freezing cold all the time and she's craving ice and feels hungry all the time.  Please advise what patient should do.

## 2014-07-20 NOTE — Telephone Encounter (Signed)
Patient states that she does take her ferrous sulfate every day.  She scheduled lab appointment 07/21/14 @ 8:30am.  Orders in [place].

## 2014-07-20 NOTE — Telephone Encounter (Signed)
LMOM for patient to CB.

## 2014-07-20 NOTE — Telephone Encounter (Signed)
Noted  

## 2014-07-21 ENCOUNTER — Other Ambulatory Visit (INDEPENDENT_AMBULATORY_CARE_PROVIDER_SITE_OTHER): Payer: Self-pay

## 2014-07-21 DIAGNOSIS — D509 Iron deficiency anemia, unspecified: Secondary | ICD-10-CM

## 2014-07-21 LAB — CBC WITH DIFFERENTIAL/PLATELET
Basophils Absolute: 0.1 10*3/uL (ref 0.0–0.1)
Basophils Relative: 0.7 % (ref 0.0–3.0)
Eosinophils Absolute: 0.2 10*3/uL (ref 0.0–0.7)
Eosinophils Relative: 2.1 % (ref 0.0–5.0)
HCT: 35.9 % — ABNORMAL LOW (ref 36.0–46.0)
Hemoglobin: 10.8 g/dL — ABNORMAL LOW (ref 12.0–15.0)
Lymphocytes Relative: 28.1 % (ref 12.0–46.0)
Lymphs Abs: 2.3 10*3/uL (ref 0.7–4.0)
MCHC: 30 g/dL (ref 30.0–36.0)
MCV: 70.9 fl — ABNORMAL LOW (ref 78.0–100.0)
MONOS PCT: 7.4 % (ref 3.0–12.0)
Monocytes Absolute: 0.6 10*3/uL (ref 0.1–1.0)
NEUTROS ABS: 5.1 10*3/uL (ref 1.4–7.7)
Neutrophils Relative %: 61.7 % (ref 43.0–77.0)
Platelets: 341 10*3/uL (ref 150.0–400.0)
RBC: 5.06 Mil/uL (ref 3.87–5.11)
RDW: 19.6 % — ABNORMAL HIGH (ref 11.5–15.5)
WBC: 8.3 10*3/uL (ref 4.0–10.5)

## 2014-07-21 LAB — IBC PANEL
Iron: 37 ug/dL — ABNORMAL LOW (ref 42–145)
Saturation Ratios: 7.2 % — ABNORMAL LOW (ref 20.0–50.0)
Transferrin: 368.2 mg/dL — ABNORMAL HIGH (ref 212.0–360.0)

## 2014-07-21 LAB — FERRITIN: FERRITIN: 4.6 ng/mL — AB (ref 10.0–291.0)

## 2014-07-22 ENCOUNTER — Telehealth: Payer: Self-pay | Admitting: Family Medicine

## 2014-07-22 DIAGNOSIS — D509 Iron deficiency anemia, unspecified: Secondary | ICD-10-CM

## 2014-07-22 DIAGNOSIS — N920 Excessive and frequent menstruation with regular cycle: Secondary | ICD-10-CM

## 2014-07-22 NOTE — Telephone Encounter (Signed)
Pls call pt and tell her that the GYN I would refer her to for 2nd opinion is Dr. Clovia Cuff in [place].  Let me know if she wants me to order referral.  She can think about it if she wants before she answers.-thx

## 2014-07-23 NOTE — Telephone Encounter (Signed)
Pt agreeable.  Please set up referral.

## 2014-07-23 NOTE — Telephone Encounter (Signed)
LMOM for pt to CB.  

## 2014-07-23 NOTE — Telephone Encounter (Signed)
OK, referral ordered 

## 2014-07-29 ENCOUNTER — Ambulatory Visit: Payer: Medicare Other | Admitting: Internal Medicine

## 2014-08-19 ENCOUNTER — Ambulatory Visit (INDEPENDENT_AMBULATORY_CARE_PROVIDER_SITE_OTHER): Payer: PPO | Admitting: Family Medicine

## 2014-08-19 ENCOUNTER — Encounter: Payer: Self-pay | Admitting: Family Medicine

## 2014-08-19 VITALS — BP 109/75 | HR 92 | Temp 99.2°F | Resp 18 | Ht 67.5 in | Wt 239.0 lb

## 2014-08-19 DIAGNOSIS — F419 Anxiety disorder, unspecified: Secondary | ICD-10-CM

## 2014-08-19 DIAGNOSIS — F32A Depression, unspecified: Secondary | ICD-10-CM

## 2014-08-19 DIAGNOSIS — E118 Type 2 diabetes mellitus with unspecified complications: Secondary | ICD-10-CM

## 2014-08-19 DIAGNOSIS — F329 Major depressive disorder, single episode, unspecified: Secondary | ICD-10-CM

## 2014-08-19 DIAGNOSIS — F418 Other specified anxiety disorders: Secondary | ICD-10-CM

## 2014-08-19 DIAGNOSIS — E039 Hypothyroidism, unspecified: Secondary | ICD-10-CM

## 2014-08-19 DIAGNOSIS — N920 Excessive and frequent menstruation with regular cycle: Secondary | ICD-10-CM

## 2014-08-19 DIAGNOSIS — D509 Iron deficiency anemia, unspecified: Secondary | ICD-10-CM

## 2014-08-19 DIAGNOSIS — E785 Hyperlipidemia, unspecified: Secondary | ICD-10-CM

## 2014-08-19 LAB — COMPREHENSIVE METABOLIC PANEL
ALK PHOS: 169 U/L — AB (ref 39–117)
ALT: 33 U/L (ref 0–35)
AST: 22 U/L (ref 0–37)
Albumin: 3.5 g/dL (ref 3.5–5.2)
BUN: 13 mg/dL (ref 6–23)
CO2: 25 meq/L (ref 19–32)
Calcium: 8.4 mg/dL (ref 8.4–10.5)
Chloride: 106 mEq/L (ref 96–112)
Creatinine, Ser: 0.86 mg/dL (ref 0.40–1.20)
GFR: 74.64 mL/min (ref >60.00)
Glucose, Bld: 315 mg/dL — ABNORMAL HIGH (ref 70–99)
POTASSIUM: 4.2 meq/L (ref 3.5–5.1)
Sodium: 136 mEq/L (ref 135–145)
Total Bilirubin: 0.4 mg/dL (ref 0.2–1.2)
Total Protein: 5.8 g/dL — ABNORMAL LOW (ref 6.0–8.3)

## 2014-08-19 NOTE — Progress Notes (Signed)
Pre visit review using our clinic review tool, if applicable. No additional management support is needed unless otherwise documented below in the visit note. 

## 2014-08-19 NOTE — Progress Notes (Signed)
OFFICE NOTE  08/19/2014  CC:  Chief Complaint  Patient presents with  . Follow-up    not fasting   HPI: Patient is a 49 y.o. Caucasian female who is here for f/u hyperlipidemia, iron def anemia secondary to menorrhagia, anxiety/depression.    Started atorvastatin 40 mg qd started 02/2014.  Tolerating med, compliant with med. She is not fasting today. She has appt 09/13/14 for 2nd opinion with a diff GYN regarding DUB/menorrhagia and the possibility of getting hysterectomy for this.  Still with very heavy, regularly occuring menses.  Taking iron bid.  Doing ok on effexor and klonopin: mood and anxiety level stable.  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   Pertinent PMH:  Past medical, surgical, social, and family history reviewed and no changes are noted since last office visit.  MEDS:  Outpatient Prescriptions Prior to Visit  Medication Sig Dispense Refill  . atorvastatin (LIPITOR) 40 MG tablet Take 1 tablet (40 mg total) by mouth daily. 90 tablet 1  . clonazePAM (KLONOPIN) 1 MG tablet 1 tab a.m. 1 tab at 2p and 2 tabs at bedtime 120 tablet 5  . EPINEPHrine (EPI-PEN) 0.3 mg/0.3 mL DEVI Inject 0.3mg  into thigh muscle once in case of severe allergic reaction, then proceed to nearest ER. 0.3 mL 0  . fluticasone (FLONASE) 50 MCG/ACT nasal spray     . insulin aspart (NOVOLOG) 100 UNIT/ML injection Use as instructed via insulin pump 30 mL 2  . venlafaxine XR (EFFEXOR-XR) 150 MG 24 hr capsule Take 2 capsules (300 mg total) by mouth daily. 180 capsule 4  . Alcohol Swabs (PHARMACIST CHOICE ALCOHOL) PADS Test 6 times daily 600 each 2  . ferrous sulfate 325 (65 FE) MG EC tablet Take 1 tablet (325 mg total) by mouth 2 (two) times daily.    Marland Kitchen FREESTYLE LITE test strip Test 6 times daily 550 each 2  . glucagon (GLUCAGON EMERGENCY) 1 MG injection Inject 1 mg into the muscle once as needed. (Patient not taking: Reported on 08/19/2014) 1 each 12  . glucose  blood (BAYER CONTOUR NEXT TEST) test strip Every 2 hours 300 each 5  . metFORMIN (GLUCOPHAGE) 500 MG tablet 1 tab po tid (Patient not taking: Reported on 08/19/2014) 90 tablet 6  . PHARMACIST CHOICE LANCETS MISC Test 6 times daily 600 each 2   No facility-administered medications prior to visit.    PE: Blood pressure 109/75, pulse 92, temperature 99.2 F (37.3 C), temperature source Temporal, resp. rate 18, height 5' 7.5" (1.715 m), weight 239 lb (108.41 kg), SpO2 98 %. Gen: Alert, well appearing.  Patient is oriented to person, place, time, and situation. AFFECT: pleasant, lucid thought and speech. No further exam today.  LAB: none today RECENT:  Lab Results  Component Value Date   WBC 8.3 07/21/2014   HGB 10.8* 07/21/2014   HCT 35.9* 07/21/2014   MCV 70.9* 07/21/2014   PLT 341.0 07/21/2014     Chemistry      Component Value Date/Time   NA 138 08/17/2013 1448   K 4.2 08/17/2013 1448   CL 104 08/17/2013 1448   CO2 25 08/17/2013 1448   BUN 15 08/17/2013 1448   CREATININE 0.9 08/17/2013 1448      Component Value Date/Time   CALCIUM 9.1 08/17/2013 1448   ALKPHOS 163* 08/17/2013 1448   AST 30 08/17/2013 1448   ALT 30 08/17/2013 1448   BILITOT 0.8 08/17/2013 1448  Lab Results  Component Value Date   TSH 1.308 06/03/2014   Lab Results  Component Value Date   CHOL 196 02/16/2014   HDL 58.10 02/16/2014   LDLCALC 128* 02/16/2014   LDLDIRECT 167.1 04/02/2013   TRIG 48.0 02/16/2014   CHOLHDL 3 02/16/2014   Lab Results  Component Value Date   HGBA1C 9.8* 06/03/2014    IMPRESSION AND PLAN:  1) Hyperlipidemia; The current medical regimen is effective;  continue present plan and medications.  Pt not fasting today. Will recheck FLP at CPE in 1 yr. CMET today.  2) Iron def anemia secondary to menorrhagia: Hb fine 14mo ago, she continues FeSO4 bid. She is getting GYN second opinion re: hysterectomy (her other GYN said no surgery until HbA1c is 6%!).  3) Anxiety and  depression: The current medical regimen is effective;  continue present plan and medications. Effexor XR 150mg , 2 tabs qd + clonaz 1mg  (1-1-2).  4) DM 2, currently managed with insulin pump via Dr. Cruzita Lederer, her endocrinologist.  5) Hypothyroidism: TSH 05/2014 normal.  Will repeat at CPE in 6 mo unless Dr. Cruzita Lederer repeats it before then.  An After Visit Summary was printed and given to the patient.  FOLLOW UP: 6 mo, fasting CPE

## 2014-08-27 ENCOUNTER — Telehealth: Payer: Self-pay | Admitting: Internal Medicine

## 2014-08-27 MED ORDER — INSULIN ASPART 100 UNIT/ML ~~LOC~~ SOLN: SUBCUTANEOUS | Status: DC

## 2014-08-27 NOTE — Telephone Encounter (Signed)
erx done

## 2014-08-27 NOTE — Telephone Encounter (Signed)
Patient refill of Novalog for her insulin pump.

## 2014-09-13 ENCOUNTER — Encounter: Payer: Self-pay | Admitting: Obstetrics & Gynecology

## 2014-09-13 ENCOUNTER — Ambulatory Visit (INDEPENDENT_AMBULATORY_CARE_PROVIDER_SITE_OTHER): Payer: PPO | Admitting: Obstetrics & Gynecology

## 2014-09-13 VITALS — BP 112/80 | HR 98 | Ht 67.0 in | Wt 234.7 lb

## 2014-09-13 DIAGNOSIS — N920 Excessive and frequent menstruation with regular cycle: Secondary | ICD-10-CM

## 2014-09-13 MED ORDER — MEGESTROL ACETATE 40 MG PO TABS: 40.0000 mg | ORAL_TABLET | Freq: Two times a day (BID) | ORAL | Status: DC

## 2014-09-13 NOTE — Patient Instructions (Signed)
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
## 2014-09-13 NOTE — Progress Notes (Signed)
Patient here today for abnormal uterine bleeding. Reports experiencing extremely heavy periods lasting 14 days since 2005-- reports the first 4 days she needs to stay in bed. Also reports having to wear a depends diaper and 2 pads.

## 2014-09-17 ENCOUNTER — Ambulatory Visit (HOSPITAL_COMMUNITY)
Admission: RE | Admit: 2014-09-17 | Discharge: 2014-09-17 | Disposition: A | Discharge status: Home / Self Care | Payer: PPO | Source: Ambulatory Visit | Attending: Obstetrics & Gynecology | Admitting: Obstetrics & Gynecology

## 2014-09-17 DIAGNOSIS — N920 Excessive and frequent menstruation with regular cycle: Secondary | ICD-10-CM | POA: Insufficient documentation

## 2014-09-23 ENCOUNTER — Ambulatory Visit: Payer: Medicare Other | Admitting: *Deleted

## 2014-09-28 ENCOUNTER — Ambulatory Visit (INDEPENDENT_AMBULATORY_CARE_PROVIDER_SITE_OTHER): Payer: PPO | Admitting: Family Medicine

## 2014-09-28 ENCOUNTER — Encounter: Payer: Self-pay | Admitting: Family Medicine

## 2014-09-28 VITALS — BP 106/68 | HR 93 | Temp 98.4°F | Ht 67.0 in | Wt 233.0 lb

## 2014-09-28 DIAGNOSIS — J018 Other acute sinusitis: Secondary | ICD-10-CM

## 2014-09-28 DIAGNOSIS — J180 Bronchopneumonia, unspecified organism: Secondary | ICD-10-CM

## 2014-09-28 MED ORDER — ALBUTEROL SULFATE HFA 108 (90 BASE) MCG/ACT IN AERS: 1.0000 | INHALATION_SPRAY | RESPIRATORY_TRACT | Status: DC | PRN

## 2014-09-28 MED ORDER — PREDNISONE 20 MG PO TABS: ORAL_TABLET | ORAL | Status: DC

## 2014-09-28 MED ORDER — AZITHROMYCIN 250 MG PO TABS: ORAL_TABLET | ORAL | Status: DC

## 2014-09-28 NOTE — Progress Notes (Signed)
Pre visit review using our clinic review tool, if applicable. No additional management support is needed unless otherwise documented below in the visit note. 

## 2014-09-28 NOTE — Progress Notes (Signed)
OFFICE NOTE  09/28/2014  CC:  Chief Complaint  Patient presents with  . Cough  . Nasal Congestion     HPI: Patient is a 49 y.o. Caucasian female who is here for 1 week of cough, nasal congestion, decreased energy, cough worse hs, +chest tight, wheeze+, orthostatic dizziness, lately cough worse and causing HA and chest pain when coughs.  Cough drops, tylenol, ibup all being tried.  +Pain in face and forehead.  Ears feel full.  Pertinent PMH:  Past medical, surgical, social, and family history reviewed and no changes are noted since last office visit.  MEDS:  Outpatient Prescriptions Prior to Visit  Medication Sig Dispense Refill  . atorvastatin (LIPITOR) 40 MG tablet Take 1 tablet (40 mg total) by mouth daily. 90 tablet 1  . clonazePAM (KLONOPIN) 1 MG tablet 1 tab a.m. 1 tab at 2p and 2 tabs at bedtime 120 tablet 5  . EPINEPHrine (EPI-PEN) 0.3 mg/0.3 mL DEVI Inject 0.3mg  into thigh muscle once in case of severe allergic reaction, then proceed to nearest ER. 0.3 mL 0  . ferrous sulfate 325 (65 FE) MG EC tablet Take 1 tablet (325 mg total) by mouth 2 (two) times daily.    . fluticasone (FLONASE) 50 MCG/ACT nasal spray     . FREESTYLE LITE test strip Test 6 times daily 550 each 2  . glucagon (GLUCAGON EMERGENCY) 1 MG injection Inject 1 mg into the muscle once as needed. 1 each 12  . glucose blood (BAYER CONTOUR NEXT TEST) test strip Every 2 hours 300 each 5  . insulin aspart (NOVOLOG) 100 UNIT/ML injection Use as instructed via insulin pump 30 mL 2  . PHARMACIST CHOICE LANCETS MISC Test 6 times daily 600 each 2  . venlafaxine XR (EFFEXOR-XR) 150 MG 24 hr capsule Take 2 capsules (300 mg total) by mouth daily. 180 capsule 4  . megestrol (MEGACE) 40 MG tablet Take 1 tablet (40 mg total) by mouth 2 (two) times daily. (Patient not taking: Reported on 09/28/2014) 60 tablet 3  . metFORMIN (GLUCOPHAGE) 500 MG tablet 1 tab po tid (Patient not taking: Reported on 08/19/2014) 90 tablet 6  . Alcohol  Swabs (PHARMACIST CHOICE ALCOHOL) PADS Test 6 times daily 600 each 2   No facility-administered medications prior to visit.    PE: Blood pressure 106/68, pulse 93, temperature 98.4 F (36.9 C), temperature source Temporal, height 5\' 7"  (1.702 m), weight 233 lb (105.688 kg), last menstrual period 08/30/2014, SpO2 98 %. VS: noted--normal. Gen: alert, NAD, NONTOXIC APPEARING.  Pt coughing a lot in room--dry. HEENT: eyes without injection, drainage, or swelling.  Ears: EACs clear, TMs with normal light reflex and landmarks.  Nose: Clear rhinorrhea, with some dried, crusty exudate adherent to mildly injected mucosa.  No purulent d/c.  +significant bilat (R>L) paranasal sinus TTP.  No facial swelling.  Throat and mouth without focal lesion.  No pharyngial swelling, erythema, or exudate.   Neck: supple, no LAD.   LUNGS: CTA bilat except for soft insp crackles (sound "wet") with diminished aeration in left base, some coarse exp wheezing with forced exhalation but these clear with coughing, nonlabored resps.   CV: RRR, no m/r/g. EXT: no c/c/e SKIN: no rash  IMPRESSION AND PLAN:  Acute sinusitis with bronchopneumonia. Prednisone 40mg  qd x 5d. Azithromycin x 5d. Ventolin HFA 1-2 puffs q4h prn. Robitussin DM prn and saline nasal spray.  Rest+fluids+monitor glucoses for worsening due to steroids and adjust insulin as needed.  An After Visit Summary was  printed and given to the patient.  FOLLOW UP: prn

## 2014-10-01 ENCOUNTER — Other Ambulatory Visit: Payer: Self-pay

## 2014-10-01 DIAGNOSIS — Z1231 Encounter for screening mammogram for malignant neoplasm of breast: Secondary | ICD-10-CM

## 2014-10-14 ENCOUNTER — Ambulatory Visit: Payer: PPO | Admitting: *Deleted

## 2014-10-15 ENCOUNTER — Encounter: Payer: Self-pay | Admitting: Obstetrics & Gynecology

## 2014-10-15 ENCOUNTER — Ambulatory Visit (INDEPENDENT_AMBULATORY_CARE_PROVIDER_SITE_OTHER): Payer: PPO | Admitting: Obstetrics & Gynecology

## 2014-10-15 VITALS — BP 106/79 | HR 80 | Temp 97.9°F | Wt 232.7 lb

## 2014-10-15 DIAGNOSIS — N920 Excessive and frequent menstruation with regular cycle: Secondary | ICD-10-CM

## 2014-10-15 NOTE — Patient Instructions (Signed)
Levonorgestrel intrauterine device (IUD) What is this medicine? LEVONORGESTREL IUD (LEE voe nor jes trel) is a contraceptive (birth control) device. The device is placed inside the uterus by a healthcare professional. It is used to prevent pregnancy and can also be used to treat heavy bleeding that occurs during your period. Depending on the device, it can be used for 3 to 5 years. This medicine may be used for other purposes; ask your health care provider or pharmacist if you have questions. COMMON BRAND NAME(S): LILETTA, Mirena, Skyla What should I tell my health care provider before I take this medicine? They need to know if you have any of these conditions: -abnormal Pap smear -cancer of the breast, uterus, or cervix -diabetes -endometritis -genital or pelvic infection now or in the past -have more than one sexual partner or your partner has more than one partner -heart disease -history of an ectopic or tubal pregnancy -immune system problems -IUD in place -liver disease or tumor -problems with blood clots or take blood-thinners -use intravenous drugs -uterus of unusual shape -vaginal bleeding that has not been explained -an unusual or allergic reaction to levonorgestrel, other hormones, silicone, or polyethylene, medicines, foods, dyes, or preservatives -pregnant or trying to get pregnant -breast-feeding How should I use this medicine? This device is placed inside the uterus by a health care professional. Talk to your pediatrician regarding the use of this medicine in children. Special care may be needed. Overdosage: If you think you have taken too much of this medicine contact a poison control center or emergency room at once. NOTE: This medicine is only for you. Do not share this medicine with others. What if I miss a dose? This does not apply. What may interact with this medicine? Do not take this medicine with any of the following  medications: -amprenavir -bosentan -fosamprenavir This medicine may also interact with the following medications: -aprepitant -barbiturate medicines for inducing sleep or treating seizures -bexarotene -griseofulvin -medicines to treat seizures like carbamazepine, ethotoin, felbamate, oxcarbazepine, phenytoin, topiramate -modafinil -pioglitazone -rifabutin -rifampin -rifapentine -some medicines to treat HIV infection like atazanavir, indinavir, lopinavir, nelfinavir, tipranavir, ritonavir -St. John's wort -warfarin This list may not describe all possible interactions. Give your health care provider a list of all the medicines, herbs, non-prescription drugs, or dietary supplements you use. Also tell them if you smoke, drink alcohol, or use illegal drugs. Some items may interact with your medicine. What should I watch for while using this medicine? Visit your doctor or health care professional for regular check ups. See your doctor if you or your partner has sexual contact with others, becomes HIV positive, or gets a sexual transmitted disease. This product does not protect you against HIV infection (AIDS) or other sexually transmitted diseases. You can check the placement of the IUD yourself by reaching up to the top of your vagina with clean fingers to feel the threads. Do not pull on the threads. It is a good habit to check placement after each menstrual period. Call your doctor right away if you feel more of the IUD than just the threads or if you cannot feel the threads at all. The IUD may come out by itself. You may become pregnant if the device comes out. If you notice that the IUD has come out use a backup birth control method like condoms and call your health care provider. Using tampons will not change the position of the IUD and are okay to use during your period. What side effects may   I notice from receiving this medicine? Side effects that you should report to your doctor or  health care professional as soon as possible: -allergic reactions like skin rash, itching or hives, swelling of the face, lips, or tongue -fever, flu-like symptoms -genital sores -high blood pressure -no menstrual period for 6 weeks during use -pain, swelling, warmth in the leg -pelvic pain or tenderness -severe or sudden headache -signs of pregnancy -stomach cramping -sudden shortness of breath -trouble with balance, talking, or walking -unusual vaginal bleeding, discharge -yellowing of the eyes or skin Side effects that usually do not require medical attention (report to your doctor or health care professional if they continue or are bothersome): -acne -breast pain -change in sex drive or performance -changes in weight -cramping, dizziness, or faintness while the device is being inserted -headache -irregular menstrual bleeding within first 3 to 6 months of use -nausea This list may not describe all possible side effects. Call your doctor for medical advice about side effects. You may report side effects to FDA at 1-800-FDA-1088. Where should I keep my medicine? This does not apply. NOTE: This sheet is a summary. It may not cover all possible information. If you have questions about this medicine, talk to your doctor, pharmacist, or health care provider.  2015, [employer]vier/Gold Standard. (2011-08-02 13:54:04)  

## 2014-10-15 NOTE — Progress Notes (Signed)
Subjective:     Patient ID: Kristie [name], female   DOB: 08-24-1965, 49 y.o.   MRN: [id number]  [id number] No LMP recorded. No bleeding now on Megace, [place] result reviewed   Review of Systems  Constitutional: Negative.   Genitourinary: Positive for menstrual problem. Negative for vaginal bleeding and vaginal discharge.       Objective:   Physical Exam  Constitutional: She is oriented to person, place, and time. She appears well-developed. No distress.  Pulmonary/Chest: Effort normal. No respiratory distress.  Neurological: She is alert and oriented to person, place, and time.  Psychiatric: She has a normal mood and affect. Her behavior is normal.  Vitals reviewed.  CLINICAL DATA: Menorrhagia  EXAM: TRANSABDOMINAL AND TRANSVAGINAL ULTRASOUND OF PELVIS  TECHNIQUE: Both transabdominal and transvaginal ultrasound examinations of the pelvis were performed. Transabdominal technique was performed for global imaging of the pelvis including uterus, ovaries, adnexal regions, and pelvic cul-de-sac. It was necessary to proceed with endovaginal exam following the transabdominal exam to visualize the endometrium and bilateral ovaries.  COMPARISON: None  FINDINGS: Uterus  Measurements: 11.4 x 5.4 x 6.3 cm. No fibroids or other mass visualized.  Endometrium  Thickness: 18 mm. No focal abnormality visualized.  Right ovary  Measurements: 2.2 x 1.6 x 2.1 cm. Normal appearance/no adnexal mass.  Left ovary  Measurements: 4.8 x 2.2 x 2.2 cm. Normal appearance/no adnexal mass.  Other findings  No free fluid.  IMPRESSION: Endometrial complex measures 18 mm.  If bleeding remains unresponsive to hormonal or medical therapy, focal lesion work-up with sonohysterogram should be considered. Endometrial biopsy should also be considered in pre-menopausal patients at high risk for endometrial carcinoma. (Ref: Radiological Reasoning: Algorithmic Workup of Abnormal  Vaginal Bleeding with Endovaginal Sonography and Sonohysterography. AJR 2008; 893:T34-28)   Electronically Signed  By: Julian Hy M.D.  On: 09/17/2014 08:47     Assessment:     Perimenopausal menorrhagia     Plan:     Allen Kell, will schedule   Woodroe Mode, MD

## 2014-11-05 ENCOUNTER — Telehealth: Payer: Self-pay | Admitting: Internal Medicine

## 2014-11-05 NOTE — Telephone Encounter (Signed)
Called pt and lvm advising her that as far as we know [facility] does not carry insulin pump supplies. Pt's usually use a company that their insurance works with to cover the supplies, e.g. [employer], [employer], [employer] or [employer]. Advised pt to call and have them send a form for Dr Cruzita Lederer to complete and sign.

## 2014-11-05 NOTE — Telephone Encounter (Signed)
Pt needs [place] to call in a RX to [facility] on [address] for the reservoirs and kwik sets for her insulin pump med[employer]onic insulin pump

## 2014-11-08 ENCOUNTER — Ambulatory Visit
Admission: RE | Admit: 2014-11-08 | Discharge: 2014-11-08 | Disposition: A | Discharge status: Home / Self Care | Payer: PPO | Source: Ambulatory Visit

## 2014-11-08 DIAGNOSIS — Z1231 Encounter for screening mammogram for malignant neoplasm of breast: Secondary | ICD-10-CM

## 2014-11-10 ENCOUNTER — Ambulatory Visit: Payer: PPO | Admitting: Internal Medicine

## 2014-11-14 HISTORY — PX: INTRAUTERINE DEVICE (IUD) INSERTION: SHX5877

## 2014-11-18 ENCOUNTER — Encounter: Payer: Self-pay | Admitting: Obstetrics & Gynecology

## 2014-11-18 ENCOUNTER — Ambulatory Visit (INDEPENDENT_AMBULATORY_CARE_PROVIDER_SITE_OTHER): Payer: PPO | Admitting: Obstetrics & Gynecology

## 2014-11-18 VITALS — BP 109/63 | HR 81 | Temp 98.1°F | Ht 67.0 in | Wt 228.9 lb

## 2014-11-18 DIAGNOSIS — Z30430 Encounter for insertion of intrauterine contraceptive device: Secondary | ICD-10-CM | POA: Diagnosis not present

## 2014-11-18 DIAGNOSIS — Z3202 Encounter for pregnancy test, result negative: Secondary | ICD-10-CM

## 2014-11-18 DIAGNOSIS — Z975 Presence of (intrauterine) contraceptive device: Secondary | ICD-10-CM

## 2014-11-18 LAB — POCT PREGNANCY, URINE: PREG TEST UR: NEGATIVE

## 2014-11-18 MED ORDER — LEVONORGESTREL 20 MCG/24HR IU IUD
INTRAUTERINE_SYSTEM | Freq: Once | INTRAUTERINE | Status: AC
  Administered 2014-11-18: 1 via INTRAUTERINE

## 2014-11-18 NOTE — Patient Instructions (Signed)
Levonorgestrel intrauterine device (IUD) What is this medicine? LEVONORGESTREL IUD (LEE voe nor jes trel) is a contraceptive (birth control) device. The device is placed inside the uterus by a healthcare professional. It is used to prevent pregnancy and can also be used to treat heavy bleeding that occurs during your period. Depending on the device, it can be used for 3 to 5 years. This medicine may be used for other purposes; ask your health care provider or pharmacist if you have questions. COMMON BRAND NAME(S): LILETTA, Mirena, Skyla What should I tell my health care provider before I take this medicine? They need to know if you have any of these conditions: -abnormal Pap smear -cancer of the breast, uterus, or cervix -diabetes -endometritis -genital or pelvic infection now or in the past -have more than one sexual partner or your partner has more than one partner -heart disease -history of an ectopic or tubal pregnancy -immune system problems -IUD in place -liver disease or tumor -problems with blood clots or take blood-thinners -use intravenous drugs -uterus of unusual shape -vaginal bleeding that has not been explained -an unusual or allergic reaction to levonorgestrel, other hormones, silicone, or polyethylene, medicines, foods, dyes, or preservatives -pregnant or trying to get pregnant -breast-feeding How should I use this medicine? This device is placed inside the uterus by a health care professional. Talk to your pediatrician regarding the use of this medicine in children. Special care may be needed. Overdosage: If you think you have taken too much of this medicine contact a poison control center or emergency room at once. NOTE: This medicine is only for you. Do not share this medicine with others. What if I miss a dose? This does not apply. What may interact with this medicine? Do not take this medicine with any of the following  medications: -amprenavir -bosentan -fosamprenavir This medicine may also interact with the following medications: -aprepitant -barbiturate medicines for inducing sleep or treating seizures -bexarotene -griseofulvin -medicines to treat seizures like carbamazepine, ethotoin, felbamate, oxcarbazepine, phenytoin, topiramate -modafinil -pioglitazone -rifabutin -rifampin -rifapentine -some medicines to treat HIV infection like atazanavir, indinavir, lopinavir, nelfinavir, tipranavir, ritonavir -St. John's wort -warfarin This list may not describe all possible interactions. Give your health care provider a list of all the medicines, herbs, non-prescription drugs, or dietary supplements you use. Also tell them if you smoke, drink alcohol, or use illegal drugs. Some items may interact with your medicine. What should I watch for while using this medicine? Visit your doctor or health care professional for regular check ups. See your doctor if you or your partner has sexual contact with others, becomes HIV positive, or gets a sexual transmitted disease. This product does not protect you against HIV infection (AIDS) or other sexually transmitted diseases. You can check the placement of the IUD yourself by reaching up to the top of your vagina with clean fingers to feel the threads. Do not pull on the threads. It is a good habit to check placement after each menstrual period. Call your doctor right away if you feel more of the IUD than just the threads or if you cannot feel the threads at all. The IUD may come out by itself. You may become pregnant if the device comes out. If you notice that the IUD has come out use a backup birth control method like condoms and call your health care provider. Using tampons will not change the position of the IUD and are okay to use during your period. What side effects may   I notice from receiving this medicine? Side effects that you should report to your doctor or  health care professional as soon as possible: -allergic reactions like skin rash, itching or hives, swelling of the face, lips, or tongue -fever, flu-like symptoms -genital sores -high blood pressure -no menstrual period for 6 weeks during use -pain, swelling, warmth in the leg -pelvic pain or tenderness -severe or sudden headache -signs of pregnancy -stomach cramping -sudden shortness of breath -trouble with balance, talking, or walking -unusual vaginal bleeding, discharge -yellowing of the eyes or skin Side effects that usually do not require medical attention (report to your doctor or health care professional if they continue or are bothersome): -acne -breast pain -change in sex drive or performance -changes in weight -cramping, dizziness, or faintness while the device is being inserted -headache -irregular menstrual bleeding within first 3 to 6 months of use -nausea This list may not describe all possible side effects. Call your doctor for medical advice about side effects. You may report side effects to FDA at 1-800-FDA-1088. Where should I keep my medicine? This does not apply. NOTE: This sheet is a summary. It may not cover all possible information. If you have questions about this medicine, talk to your doctor, pharmacist, or health care provider.  2015, [employer]vier/Gold Standard. (2011-08-02 13:54:04)  

## 2014-11-18 NOTE — Progress Notes (Signed)
Subjective:     Patient ID: Kristie [name], female   DOB: 11-06-1965, 49 y.o.   MRN: [id number]  [id number] Patient's last menstrual period was 10/24/2014. Here for Mirena insertion. Had BTB on Megace but improved now   Review of Systems  Genitourinary: Positive for vaginal bleeding and menstrual problem. Negative for pelvic pain.       Objective:   Physical Exam  Constitutional: She is oriented to person, place, and time. She appears well-developed. No distress.  Genitourinary: Vagina normal. No vaginal discharge found.  Neurological: She is alert and oriented to person, place, and time.  Psychiatric: She has a normal mood and affect. Her behavior is normal.   Patient identified, informed consent performed, signed copy in chart, time out was performed.  Urine pregnancy test negative.  Speculum placed in the vagina.  Cervix visualized.  Cleaned with Betadine x 2.  Grasped anteriourly with a single tooth tenaculum.  Uterus sounded to 9 cm.  Mirena IUD placed per manufacturer's recommendations.  Strings trimmed to 3 cm.       Assessment:     IUD insertion     Plan:        Patient given post procedure instructions and Mirena care card with expiration date.  Patient is asked to check IUD strings periodically and follow up in 4-6 weeks for IUD check.  Woodroe Mode, MD 11/18/2014

## 2014-12-03 ENCOUNTER — Inpatient Hospital Stay (HOSPITAL_COMMUNITY)
Admission: EM | Admit: 2014-12-03 | Discharge: 2014-12-04 | DRG: 312 | Disposition: A | Discharge status: Home / Self Care | Payer: PPO | Attending: Internal Medicine | Admitting: Internal Medicine

## 2014-12-03 ENCOUNTER — Telehealth: Payer: Self-pay | Admitting: Nurse Practitioner

## 2014-12-03 ENCOUNTER — Telehealth: Payer: Self-pay

## 2014-12-03 ENCOUNTER — Encounter (HOSPITAL_COMMUNITY): Payer: Self-pay

## 2014-12-03 ENCOUNTER — Ambulatory Visit: Payer: PPO | Admitting: Nurse Practitioner

## 2014-12-03 DIAGNOSIS — Z6835 Body mass index (BMI) 35.0-35.9, adult: Secondary | ICD-10-CM | POA: Diagnosis not present

## 2014-12-03 DIAGNOSIS — I10 Essential (primary) hypertension: Secondary | ICD-10-CM | POA: Diagnosis present

## 2014-12-03 DIAGNOSIS — F329 Major depressive disorder, single episode, unspecified: Secondary | ICD-10-CM | POA: Diagnosis present

## 2014-12-03 DIAGNOSIS — I2510 Atherosclerotic heart disease of native coronary artery without angina pectoris: Secondary | ICD-10-CM | POA: Diagnosis present

## 2014-12-03 DIAGNOSIS — E1065 Type 1 diabetes mellitus with hyperglycemia: Secondary | ICD-10-CM

## 2014-12-03 DIAGNOSIS — Z833 Family history of diabetes mellitus: Secondary | ICD-10-CM | POA: Diagnosis not present

## 2014-12-03 DIAGNOSIS — F419 Anxiety disorder, unspecified: Secondary | ICD-10-CM | POA: Diagnosis present

## 2014-12-03 DIAGNOSIS — E669 Obesity, unspecified: Secondary | ICD-10-CM | POA: Diagnosis present

## 2014-12-03 DIAGNOSIS — E785 Hyperlipidemia, unspecified: Secondary | ICD-10-CM | POA: Diagnosis present

## 2014-12-03 DIAGNOSIS — E10319 Type 1 diabetes mellitus with unspecified diabetic retinopathy without macular edema: Secondary | ICD-10-CM | POA: Diagnosis present

## 2014-12-03 DIAGNOSIS — N920 Excessive and frequent menstruation with regular cycle: Secondary | ICD-10-CM | POA: Diagnosis present

## 2014-12-03 DIAGNOSIS — Z794 Long term (current) use of insulin: Secondary | ICD-10-CM | POA: Diagnosis not present

## 2014-12-03 DIAGNOSIS — Z9641 Presence of insulin pump (external) (internal): Secondary | ICD-10-CM | POA: Diagnosis present

## 2014-12-03 DIAGNOSIS — I951 Orthostatic hypotension: Principal | ICD-10-CM | POA: Diagnosis present

## 2014-12-03 DIAGNOSIS — E876 Hypokalemia: Secondary | ICD-10-CM | POA: Diagnosis present

## 2014-12-03 DIAGNOSIS — E860 Dehydration: Secondary | ICD-10-CM | POA: Diagnosis present

## 2014-12-03 LAB — URINALYSIS, ROUTINE W REFLEX MICROSCOPIC
Glucose, UA: >1000 mg/dL — AB
Ketones, ur: 15 mg/dL — AB
Nitrite: NEGATIVE
PROTEIN: 30 mg/dL — AB
SPECIFIC GRAVITY, URINE: 1.025 (ref 1.005–1.030)
UROBILINOGEN UA: 1 mg/dL (ref 0.0–1.0)
pH: 6 (ref 5.0–8.0)

## 2014-12-03 LAB — BLOOD GAS, VENOUS
Acid-base deficit: 5.5 mmol/L — ABNORMAL HIGH (ref 0.0–2.0)
Bicarbonate: 18.9 mEq/L — ABNORMAL LOW (ref 20.0–24.0)
O2 SAT: 43.3 %
PATIENT TEMPERATURE: 98.6
PH VEN: 7.348 — AB (ref 7.250–7.300)
TCO2: 17.5 mmol/L (ref 0–100)
pCO2, Ven: 35.4 mmHg — ABNORMAL LOW (ref 45.0–50.0)

## 2014-12-03 LAB — CBC WITH DIFFERENTIAL/PLATELET
Basophils Absolute: 0.1 10*3/uL (ref 0.0–0.1)
Basophils Relative: 1 % (ref 0–1)
Eosinophils Absolute: 0.1 10*3/uL (ref 0.0–0.7)
Eosinophils Relative: 1 % (ref 0–5)
HCT: 46.1 % — ABNORMAL HIGH (ref 36.0–46.0)
HEMOGLOBIN: 15.8 g/dL — AB (ref 12.0–15.0)
Lymphocytes Relative: 30 % (ref 12–46)
Lymphs Abs: 3.3 10*3/uL (ref 0.7–4.0)
MCH: 27 pg (ref 26.0–34.0)
MCHC: 34.3 g/dL (ref 30.0–36.0)
MCV: 78.7 fL (ref 78.0–100.0)
MONO ABS: 1.2 10*3/uL — AB (ref 0.1–1.0)
MONOS PCT: 11 % (ref 3–12)
NEUTROS ABS: 6.3 10*3/uL (ref 1.7–7.7)
Neutrophils Relative %: 57 % (ref 43–77)
Platelets: 420 10*3/uL — ABNORMAL HIGH (ref 150–400)
RBC: 5.86 MIL/uL — AB (ref 3.87–5.11)
RDW: 15.3 % (ref 11.5–15.5)
WBC: 11 10*3/uL — ABNORMAL HIGH (ref 4.0–10.5)

## 2014-12-03 LAB — BASIC METABOLIC PANEL
ANION GAP: 14 (ref 5–15)
BUN: 23 mg/dL — ABNORMAL HIGH (ref 6–20)
CALCIUM: 9.5 mg/dL (ref 8.9–10.3)
CHLORIDE: 98 mmol/L — AB (ref 101–111)
CO2: 20 mmol/L — ABNORMAL LOW (ref 22–32)
CREATININE: 1.31 mg/dL — AB (ref 0.44–1.00)
GFR calc non Af Amer: 47 mL/min — ABNORMAL LOW (ref >60)
GFR, EST AFRICAN AMERICAN: 55 mL/min — AB (ref >60)
Glucose, Bld: 300 mg/dL — ABNORMAL HIGH (ref 65–99)
Potassium: 3.5 mmol/L (ref 3.5–5.1)
SODIUM: 132 mmol/L — AB (ref 135–145)

## 2014-12-03 LAB — WET PREP, GENITAL
Clue Cells Wet Prep HPF POC: NONE SEEN
Trich, Wet Prep: NONE SEEN
YEAST WET PREP: NONE SEEN

## 2014-12-03 LAB — I-STAT CG4 LACTIC ACID, ED: LACTIC ACID, VENOUS: 1.02 mmol/L (ref 0.5–2.0)

## 2014-12-03 LAB — MAGNESIUM: Magnesium: 1.6 mg/dL — ABNORMAL LOW (ref 1.7–2.4)

## 2014-12-03 LAB — I-STAT CHEM 8, ED
BUN: 19 mg/dL (ref 6–20)
CALCIUM ION: 1.14 mmol/L (ref 1.12–1.23)
CHLORIDE: 103 mmol/L (ref 101–111)
CREATININE: 1 mg/dL (ref 0.44–1.00)
GLUCOSE: 227 mg/dL — AB (ref 65–99)
HCT: 44 % (ref 36.0–46.0)
Hemoglobin: 15 g/dL (ref 12.0–15.0)
POTASSIUM: 3.1 mmol/L — AB (ref 3.5–5.1)
Sodium: 136 mmol/L (ref 135–145)
TCO2: 17 mmol/L (ref 0–100)

## 2014-12-03 LAB — URINE MICROSCOPIC-ADD ON

## 2014-12-03 LAB — PHOSPHORUS: PHOSPHORUS: 3 mg/dL (ref 2.5–4.6)

## 2014-12-03 LAB — TYPE AND SCREEN
ABO/RH(D): O POS
ANTIBODY SCREEN: NEGATIVE

## 2014-12-03 LAB — ABO/RH: ABO/RH(D): O POS

## 2014-12-03 MED ORDER — SODIUM CHLORIDE 0.9 % IV BOLUS (SEPSIS)
1000.0000 mL | Freq: Once | INTRAVENOUS | Status: AC
  Administered 2014-12-03: 1000 mL via INTRAVENOUS

## 2014-12-03 MED ORDER — MAGNESIUM SULFATE IN D5W 10-5 MG/ML-% IV SOLN
1.0000 g | Freq: Once | INTRAVENOUS | Status: AC
  Administered 2014-12-03: 1 g via INTRAVENOUS
  Filled 2014-12-03: qty 100

## 2014-12-03 MED ORDER — POTASSIUM CHLORIDE CRYS ER 20 MEQ PO TBCR
40.0000 meq | EXTENDED_RELEASE_TABLET | Freq: Once | ORAL | Status: AC
  Administered 2014-12-03: 40 meq via ORAL
  Filled 2014-12-03: qty 2

## 2014-12-03 MED ORDER — MAGNESIUM SULFATE 50 % IJ SOLN: 1.0000 g | Freq: Once | INTRAMUSCULAR | Status: DC

## 2014-12-03 NOTE — ED Notes (Signed)
Introduced myself to pt, advised pt we are awaiting disposition from MD

## 2014-12-03 NOTE — ED Notes (Signed)
Bed: WA15 Expected date:  Expected time:  Means of arrival:  Comments: RES B 

## 2014-12-03 NOTE — ED Notes (Signed)
Pt has had vaginal bleeding forever.  Beginning of may they put in an IUD to control.  However, non stop bleeding since Monday.  Pt has DM and they do not want to do hysterectomy d/t DM
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
## 2014-12-03 NOTE — Telephone Encounter (Signed)
FYI Patient coming to see you at 2:45p:  Patient had an IUD (Mirena) placed on 11/16/14. She has called her OB office about the following symptoms but they said that they were not related to the IUD and that she needed to see her primary doctor.  She started bleeding a whole lot when IUD was placed and she is still bleeding a whole lot. On Sunday Morning when she woke up she couldn't get her "bearings" together. Her husband thought maybe her BS were low and gave her some food/juice to help and that didn't help her. Every time that she goes from sitting/laying to standing she gets dizzy and her legs feel like jello. (husband has to help her walk everywhere)  Sunday Evening she started throwing up and it continued through Monday. She has no more vomiting but every time she eats she feels nauseated. Per her husband on Wednesday he went to pick up child from school and she went to the bathroom and when he returned from picking up the child she was half on the toilet and half on the floor and was slurring her words.

## 2014-12-03 NOTE — Telephone Encounter (Signed)
Pt states she is extremely dizzy w/position changes, feels weak & like she will pass out w/little activity.  She has had heavy menstrual bleeding since March. Pelvic & vag [place] did not reveal abnormality. DR Roselie Awkward decided to treat w/mirena. Pt was advised if bleeding did not stop, she would need further w/u. Pt states bleeding has not stopped & her symptoms began a few days ago & are getting worse. I advised her to go to ER rather than coming to office, as she has likely had significant blood loss and may need IV fluids, possibly transfusion, pelvic/uterine imaging. Pt sd her husband can drive her within the hour. She plans to go to [facility] ER.
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
## 2014-12-03 NOTE — ED Provider Notes (Signed)
Patient signed out to me by last provider. Was given an additional liter of saline here. I'm extremely liters total. She still remains orthostatic. Repeat electrolytes show improvement in her renal function however she is mildly acidotic. Blood sugar was 227 at that point. She was also dizzy. Will consult hospitalist for admission  Kristie Leigh, MD 12/03/14 2051

## 2014-12-04 ENCOUNTER — Encounter (HOSPITAL_COMMUNITY): Payer: Self-pay | Admitting: *Deleted

## 2014-12-04 DIAGNOSIS — E860 Dehydration: Secondary | ICD-10-CM

## 2014-12-04 DIAGNOSIS — I951 Orthostatic hypotension: Principal | ICD-10-CM

## 2014-12-04 DIAGNOSIS — E876 Hypokalemia: Secondary | ICD-10-CM

## 2014-12-04 LAB — CBC
HCT: 35.6 % — ABNORMAL LOW (ref 36.0–46.0)
Hemoglobin: 12 g/dL (ref 12.0–15.0)
MCH: 26.7 pg (ref 26.0–34.0)
MCHC: 33.7 g/dL (ref 30.0–36.0)
MCV: 79.1 fL (ref 78.0–100.0)
Platelets: 331 10*3/uL (ref 150–400)
RBC: 4.5 MIL/uL (ref 3.87–5.11)
RDW: 15.4 % (ref 11.5–15.5)
WBC: 8 10*3/uL (ref 4.0–10.5)

## 2014-12-04 LAB — COMPREHENSIVE METABOLIC PANEL
ALT: 14 U/L (ref 14–54)
ANION GAP: 7 (ref 5–15)
AST: 11 U/L — ABNORMAL LOW (ref 15–41)
Albumin: 3.4 g/dL — ABNORMAL LOW (ref 3.5–5.0)
Alkaline Phosphatase: 106 U/L (ref 38–126)
BUN: 20 mg/dL (ref 6–20)
CO2: 21 mmol/L — ABNORMAL LOW (ref 22–32)
CREATININE: 0.91 mg/dL (ref 0.44–1.00)
Calcium: 8.3 mg/dL — ABNORMAL LOW (ref 8.9–10.3)
Chloride: 108 mmol/L (ref 101–111)
GLUCOSE: 142 mg/dL — AB (ref 65–99)
Potassium: 3.2 mmol/L — ABNORMAL LOW (ref 3.5–5.1)
Sodium: 136 mmol/L (ref 135–145)
TOTAL PROTEIN: 6 g/dL — AB (ref 6.5–8.1)
Total Bilirubin: 1.1 mg/dL (ref 0.3–1.2)

## 2014-12-04 LAB — GLUCOSE, CAPILLARY
GLUCOSE-CAPILLARY: 265 mg/dL — AB (ref 65–99)
Glucose-Capillary: 171 mg/dL — ABNORMAL HIGH (ref 65–99)
Glucose-Capillary: 211 mg/dL — ABNORMAL HIGH (ref 65–99)
Glucose-Capillary: 414 mg/dL — ABNORMAL HIGH (ref 65–99)

## 2014-12-04 LAB — TROPONIN I
Troponin I: <0.03 ng/mL (ref <0.031)
Troponin I: <0.03 ng/mL (ref <0.031)

## 2014-12-04 LAB — MRSA PCR SCREENING: MRSA by PCR: POSITIVE — AB

## 2014-12-04 LAB — PHOSPHORUS: Phosphorus: 2.6 mg/dL (ref 2.5–4.6)

## 2014-12-04 LAB — MAGNESIUM: MAGNESIUM: 2.2 mg/dL (ref 1.7–2.4)

## 2014-12-04 LAB — TSH: TSH: 1.278 u[IU]/mL (ref 0.350–4.500)

## 2014-12-04 MED ORDER — ACETAMINOPHEN 325 MG PO TABS: 650.0000 mg | ORAL_TABLET | Freq: Four times a day (QID) | ORAL | Status: DC | PRN

## 2014-12-04 MED ORDER — INSULIN PUMP
SUBCUTANEOUS | Status: DC
  Administered 2014-12-04: 3.9 via SUBCUTANEOUS
  Administered 2014-12-04: 2.2 via SUBCUTANEOUS
  Administered 2014-12-04: 8.6 via SUBCUTANEOUS
  Filled 2014-12-04: qty 1

## 2014-12-04 MED ORDER — HYDROCODONE-ACETAMINOPHEN 5-325 MG PO TABS: 1.0000 | ORAL_TABLET | ORAL | Status: DC | PRN

## 2014-12-04 MED ORDER — FLUTICASONE PROPIONATE 50 MCG/ACT NA SUSP
1.0000 | Freq: Every day | NASAL | Status: DC | PRN
  Filled 2014-12-04: qty 16

## 2014-12-04 MED ORDER — INSULIN ASPART 100 UNIT/ML ~~LOC~~ SOLN: 100.0000 [IU] | SUBCUTANEOUS | Status: DC

## 2014-12-04 MED ORDER — POTASSIUM CHLORIDE CRYS ER 20 MEQ PO TBCR
40.0000 meq | EXTENDED_RELEASE_TABLET | Freq: Once | ORAL | Status: AC
  Administered 2014-12-04: 40 meq via ORAL
  Filled 2014-12-04: qty 2

## 2014-12-04 MED ORDER — ONDANSETRON HCL 4 MG/2ML IJ SOLN: 4.0000 mg | Freq: Four times a day (QID) | INTRAMUSCULAR | Status: DC | PRN

## 2014-12-04 MED ORDER — SODIUM CHLORIDE 0.9 % IJ SOLN
3.0000 mL | Freq: Two times a day (BID) | INTRAMUSCULAR | Status: DC
  Administered 2014-12-04: 3 mL via INTRAVENOUS

## 2014-12-04 MED ORDER — CHLORHEXIDINE GLUCONATE CLOTH 2 % EX PADS: 6.0000 | MEDICATED_PAD | Freq: Every day | CUTANEOUS | Status: DC

## 2014-12-04 MED ORDER — VENLAFAXINE HCL ER 150 MG PO CP24
300.0000 mg | ORAL_CAPSULE | Freq: Every day | ORAL | Status: DC
  Administered 2014-12-04: 300 mg via ORAL
  Filled 2014-12-04: qty 2

## 2014-12-04 MED ORDER — CLONAZEPAM 0.5 MG PO TABS
1.0000 mg | ORAL_TABLET | Freq: Three times a day (TID) | ORAL | Status: DC
  Administered 2014-12-04: 1 mg via ORAL
  Filled 2014-12-04: qty 4

## 2014-12-04 MED ORDER — ONDANSETRON HCL 4 MG PO TABS: 4.0000 mg | ORAL_TABLET | Freq: Four times a day (QID) | ORAL | Status: DC | PRN

## 2014-12-04 MED ORDER — ACETAMINOPHEN 650 MG RE SUPP: 650.0000 mg | Freq: Four times a day (QID) | RECTAL | Status: DC | PRN

## 2014-12-04 MED ORDER — SODIUM CHLORIDE 0.9 % IV SOLN
INTRAVENOUS | Status: AC
  Administered 2014-12-04: 01:00:00 via INTRAVENOUS

## 2014-12-04 MED ORDER — ATORVASTATIN CALCIUM 40 MG PO TABS
40.0000 mg | ORAL_TABLET | Freq: Every day | ORAL | Status: DC
  Administered 2014-12-04: 40 mg via ORAL
  Filled 2014-12-04: qty 1

## 2014-12-04 MED ORDER — FERROUS SULFATE 325 (65 FE) MG PO TABS
325.0000 mg | ORAL_TABLET | Freq: Two times a day (BID) | ORAL | Status: DC
Start: 2014-12-04 — End: 2014-12-04
  Administered 2014-12-04: 325 mg via ORAL
  Filled 2014-12-04 (×2): qty 1

## 2014-12-04 MED ORDER — MUPIROCIN 2 % EX OINT
1.0000 "application " | TOPICAL_OINTMENT | Freq: Two times a day (BID) | CUTANEOUS | Status: DC
  Administered 2014-12-04: 1 via NASAL
  Filled 2014-12-04: qty 22

## 2014-12-04 MED ORDER — MEGESTROL ACETATE 40 MG PO TABS
40.0000 mg | ORAL_TABLET | Freq: Two times a day (BID) | ORAL | Status: DC
  Administered 2014-12-04: 40 mg via ORAL
  Filled 2014-12-04: qty 1

## 2014-12-04 NOTE — Discharge Instructions (Signed)
Dehydration, Adult Dehydration is when you lose more fluids from the body than you take in. Vital organs like the kidneys, brain, and heart cannot function without a proper amount of fluids and salt. Any loss of fluids from the body can cause dehydration.  CAUSES   Vomiting.  Diarrhea.  Excessive sweating.  Excessive urine output.  Fever. SYMPTOMS  Mild dehydration  Thirst.  Dry lips.  Slightly dry mouth. Moderate dehydration  Very dry mouth.  Sunken eyes.  Skin does not bounce back quickly when lightly pinched and released.  Dark urine and decreased urine production.  Decreased tear production.  Headache. Severe dehydration  Very dry mouth.  Extreme thirst.  Rapid, weak pulse (more than 100 beats per minute at rest).  Cold hands and feet.  Not able to sweat in spite of heat and temperature.  Rapid breathing.  Blue lips.  Confusion and lethargy.  Difficulty being awakened.  Minimal urine production.  No tears. DIAGNOSIS  Your caregiver will diagnose dehydration based on your symptoms and your exam. Blood and urine tests will help confirm the diagnosis. The diagnostic evaluation should also identify the cause of dehydration. TREATMENT  Treatment of mild or moderate dehydration can often be done at home by increasing the amount of fluids that you drink. It is best to drink small amounts of fluid more often. Drinking too much at one time can make vomiting worse. Refer to the home care instructions below. Severe dehydration needs to be treated at the hospital where you will probably be given intravenous (IV) fluids that contain water and electrolytes. HOME CARE INSTRUCTIONS   Ask your caregiver about specific rehydration instructions.  Drink enough fluids to keep your urine clear or pale yellow.  Drink small amounts frequently if you have nausea and vomiting.  Eat as you normally do.  Avoid:  Foods or drinks high in sugar.  Carbonated  drinks.  Juice.  Extremely hot or cold fluids.  Drinks with caffeine.  Fatty, greasy foods.  Alcohol.  Tobacco.  Overeating.  Gelatin desserts.  Wash your hands well to avoid spreading bacteria and viruses.  Only take over-the-counter or prescription medicines for pain, discomfort, or fever as directed by your caregiver.  Ask your caregiver if you should continue all prescribed and over-the-counter medicines.  Keep all follow-up appointments with your caregiver. SEEK MEDICAL CARE IF:  You have abdominal pain and it increases or stays in one area (localizes).  You have a rash, stiff neck, or severe headache.  You are irritable, sleepy, or difficult to awaken.  You are weak, dizzy, or extremely thirsty. SEEK IMMEDIATE MEDICAL CARE IF:   You are unable to keep fluids down or you get worse despite treatment.  You have frequent episodes of vomiting or diarrhea.  You have blood or green matter (bile) in your vomit.  You have blood in your stool or your stool looks black and tarry.  You have not urinated in 6 to 8 hours, or you have only urinated a small amount of very dark urine.  You have a fever.  You faint. MAKE SURE YOU:   Understand these instructions.  Will watch your condition.  Will get help right away if you are not doing well or get worse. Document Released: 07/02/2005 Document Revised: 09/24/2011 Document Reviewed: 02/19/2011 [facility] Patient Information 2015 [place], [place]. This information is not intended to replace advice given to you by your health care provider. Make sure you discuss any questions you have with your health care  provider.  Abnormal Uterine Bleeding Abnormal uterine bleeding can affect women at various stages in life, including teenagers, women in their reproductive years, pregnant women, and women who have reached menopause. Several kinds of uterine bleeding are considered abnormal, including:  Bleeding or spotting between  periods.   Bleeding after sexual intercourse.   Bleeding that is heavier or more than normal.   Periods that last longer than usual.  Bleeding after menopause.  Many cases of abnormal uterine bleeding are minor and simple to treat, while others are more serious. Any type of abnormal bleeding should be evaluated by your health care provider. Treatment will depend on the cause of the bleeding. HOME CARE INSTRUCTIONS Monitor your condition for any changes. The following actions may help to alleviate any discomfort you are experiencing:  Avoid the use of tampons and douches as directed by your health care provider.  Change your pads frequently. You should get regular pelvic exams and Pap tests. Keep all follow-up appointments for diagnostic tests as directed by your health care provider.  SEEK MEDICAL CARE IF:   Your bleeding lasts more than 1 week.   You feel dizzy at times.  SEEK IMMEDIATE MEDICAL CARE IF:   You pass out.   You are changing pads every 15 to 30 minutes.   You have abdominal pain.  You have a fever.   You become sweaty or weak.   You are passing large blood clots from the vagina.   You start to feel nauseous and vomit. MAKE SURE YOU:   Understand these instructions.  Will watch your condition.  Will get help right away if you are not doing well or get worse. Document Released: 07/02/2005 Document Revised: 07/07/2013 Document Reviewed: 01/29/2013 [facility] Patient Information 2015 [place], [place]. This information is not intended to replace advice given to you by your health care provider. Make sure you discuss any questions you have with your health care provider.

## 2014-12-04 NOTE — Discharge Summary (Signed)
Physician Discharge Summary  Kristie [name] OTL:[id number] DOB: [birth year] DOA: 12/03/2014  PCP: Tammi Sou, MD  Admit date: 12/03/2014 Discharge date: 12/04/2014  Time spent: <30 minutes  Recommendations for Outpatient Follow-up:  1. Discharge home with outpatient PCP follow-up   Discharge Diagnoses:  Principal Problem:   Syncope due to orthostatic hypotension   Active Problems:   Essential hypertension   Type 1 diabetes mellitus, uncontrolled   Hypokalemia   Dehydration   Hypomagnesemia   Discharge Condition: Fair  Diet recommendation: Diabetic  Filed Weights   12/04/14 0104  Weight: 103.012 kg (227 lb 1.6 oz)    History of present illness:  Please refer to admission H&P for details, in brief, 49 y/o obese female in history of diabetes mellitus on insulin pump, (see Dr. Cruzita Lederer), hyperlipidemia, anxiety and depression, iron deficiency anemia with menorrhagia presented to the ED with her syncope of several episodes for past 5 days. Patient reports having nausea and dizzy with feeling of passing out when standing up and walking. She also reports heavy menstrual bleed and following with GYN. Patient also reports poor by mouth intake and feeling extremely dizzy and dehydrated. In the ED patient was found to be tachycardic and orthostatic and did not improve despite fluid hydration. Blood will done showed normal CBC. Chemistry showed hypokalemia and hypomagnesemia. Her glucose was normal. Patient admitted to hospitalist service on telemetry.  Hospital Course:  Orthostatic syncope Secondary to severe dehydration.  Patient monitored on telemetry overnight and hydrated with IV normal saline. Serial troponins were negative. Repeat orthostasis this morning is negative. Heart rate is currently stable and patient does not have further dizziness or near syncopal episodes. She has been able to ambulate without any difficulty. Patient encouraged to keep herself hydrated while at  home. She was clinically stable and can be discharged home with outpatient PCP follow-up.  Hypokalemia/hypomagnesemia Due to dehydration and poor by mouth intake. Replenished.  Uncontrolled type 1 diabetes mellitus On insulin pump which has been continued. Blood glucose stable. Should follow-up with her endocrinologist as outpatient.  Menorrhagia Following with her GYN as outpatient. Continue iron supplements.  Remaining medical issues are stable. Patient stable to be discharged home.  Procedures:  None  Consultations:  None  Discharge Exam: Filed Vitals:   12/04/14 0536  BP: 109/74  Pulse: 90  Temp: 98 F (36.7 C)  Resp: 16    General: Middle aged obese female in no acute distress HEENT: No pallor, moist oral mucosa Chest: Clear to auscultation bilaterally, no added sounds CVS: Normal S1, no murmurs rub or gallop Check: Soft, distended, nontender, bowel sounds present,  Musculoskeletal: Warm, no edema  CNS: Alert and oriented  Discharge Instructions    Current Discharge Medication List    CONTINUE these medications which have NOT CHANGED   Details  atorvastatin (LIPITOR) 40 MG tablet Take 1 tablet (40 mg total) by mouth daily. Qty: 90 tablet, Refills: 1    clonazePAM (KLONOPIN) 1 MG tablet 1 tab a.m. 1 tab at 2p and 2 tabs at bedtime Qty: 120 tablet, Refills: 5    docusate sodium (COLACE) 100 MG capsule Take 200 mg by mouth 3 (three) times daily.    ferrous sulfate 325 (65 FE) MG EC tablet Take 1 tablet (325 mg total) by mouth 2 (two) times daily.    fluticasone (FLONASE) 50 MCG/ACT nasal spray Place 1 spray into both nostrils daily as needed for allergies or rhinitis.     FREESTYLE LITE test strip Test 6 times  daily Qty: 550 each, Refills: 2    insulin aspart (NOVOLOG) 100 UNIT/ML injection Use as instructed via insulin pump Qty: 30 mL, Refills: 2    megestrol (MEGACE) 40 MG tablet Take 1 tablet (40 mg total) by mouth 2 (two) times daily. Qty: 60  tablet, Refills: 3   Associated Diagnoses: Excessive or frequent menstruation    PHARMACIST CHOICE LANCETS MISC Test 6 times daily Qty: 600 each, Refills: 2    venlafaxine XR (EFFEXOR-XR) 150 MG 24 hr capsule Take 2 capsules (300 mg total) by mouth daily. Qty: 180 capsule, Refills: 4    albuterol (VENTOLIN HFA) 108 (90 BASE) MCG/ACT inhaler Inhale 1-2 puffs into the lungs every 4 (four) hours as needed for wheezing or shortness of breath. Qty: 1 Inhaler, Refills: 0      STOP taking these medications     EPINEPHrine (EPI-PEN) 0.3 mg/0.3 mL DEVI      glucagon (GLUCAGON EMERGENCY) 1 MG injection      metFORMIN (GLUCOPHAGE) 500 MG tablet        No Known Allergies Follow-up Information    Schedule an appointment as soon as possible for a visit with Tammi Sou, MD.   Specialty:  Family Medicine   Contact information:   [address] [place] [address] [phone]       Schedule an appointment as soon as possible for a visit with Your gynecologist.      Follow up with [facility].   Specialty:  Emergency Medicine   Why:  If symptoms worsen, including worsening weakness, use of more than 1 super pad an hour for more than 3 hours, passing out,, chest pain, trouble breathing   Contact information:   [id number] [place] [phone]      Please follow up.   Why:  Please increase your fluid intake.       Follow up with MCGOWEN,PHILIP H, MD In 1 week.   Specialty:  Family Medicine   Contact information:   [address] [place] [address] [phone]        The results of significant diagnostics from this hospitalization (including imaging, microbiology, ancillary and laboratory) are listed below for reference.    Significant Diagnostic Studies: Mm Digital Screening Bilateral  11/08/2014   CLINICAL DATA:  Screening.  EXAM: DIGITAL SCREENING BILATERAL MAMMOGRAM  WITH CAD  COMPARISON:  Previous exam(s).  ACR Breast Density Category b: There are scattered areas of fibroglandular density.  FINDINGS: There are no findings suspicious for malignancy. Images were processed with CAD.  IMPRESSION: No mammographic evidence of malignancy. A result letter of this screening mammogram will be mailed directly to the patient.  RECOMMENDATION: Screening mammogram in one year. (Code:[id number])  BI-RADS CATEGORY  1: Negative.   Electronically Signed   By: Nolon Nations M.D.   On: 11/08/2014 17:01    Microbiology: Recent Results (from the past 240 hour(s))  Wet prep, genital     Status: Abnormal   Collection Time: 12/03/14  2:30 PM  Result Value Ref Range Status   Yeast Wet Prep HPF POC NONE SEEN NONE SEEN Final   Trich, Wet Prep NONE SEEN NONE SEEN Final   Clue Cells Wet Prep HPF POC NONE SEEN NONE SEEN Final   WBC, Wet Prep HPF POC FEW (A) NONE SEEN Final  MRSA PCR Screening     Status: Abnormal   Collection Time: 12/04/14  1:44 AM  Result Value Ref Range Status   MRSA by PCR POSITIVE (A) NEGATIVE Final    Comment:        The GeneXpert MRSA Assay (FDA approved for NASAL specimens only), is one component of a comprehensive MRSA colonization surveillance program. It is not intended to diagnose MRSA infection nor to guide or monitor treatment for MRSA infections. RESULT CALLED TO, READ BACK BY AND VERIFIED WITH: F WILFONG @ 0313 ON 12/04/14 BY C DAVIS      Labs: Basic Metabolic Panel:  Recent Labs Lab 12/03/14 1345 12/03/14 1837 12/03/14 2127 12/04/14 0135  NA 132* 136  --  136  K 3.5 3.1*  --  3.2*  CL 98* 103  --  108  CO2 20*  --   --  21*  GLUCOSE 300* 227*  --  142*  BUN 23* 19  --  20  CREATININE 1.31* 1.00  --  0.91  CALCIUM 9.5  --   --  8.3*  MG  --   --  1.6* 2.2  PHOS  --   --  3.0 2.6   Liver Function Tests:  Recent Labs Lab 12/04/14 0135  AST 11*  ALT 14  ALKPHOS 106  BILITOT 1.1  PROT 6.0*  ALBUMIN 3.4*   No results  for input(s): LIPASE, AMYLASE in the last 168 hours. No results for input(s): AMMONIA in the last 168 hours. CBC:  Recent Labs Lab 12/03/14 1345 12/03/14 1837 12/04/14 0135  WBC 11.0*  --  8.0  NEUTROABS 6.3  --   --   HGB 15.8* 15.0 12.0  HCT 46.1* 44.0 35.6*  MCV 78.7  --  79.1  PLT 420*  --  331   Cardiac Enzymes:  Recent Labs Lab 12/04/14 0135 12/04/14 0750  TROPONINI <0.03 <0.03   BNP: BNP (last 3 results) No results for input(s): BNP in the last 8760 hours.  ProBNP (last 3 results) No results for input(s): PROBNP in the last 8760 hours.  CBG:  Recent Labs Lab 12/03/14 2318 12/04/14 0359 12/04/14 0753  GLUCAP 171* 211* 265*       Signed:  Ibraham Levi  [facility] 12/04/2014, 10:31 AM

## 2014-12-04 NOTE — ED Notes (Signed)
Pt is feeling some better,  Pt is alert and oriented in NAD,

## 2014-12-06 LAB — GC/CHLAMYDIA PROBE AMP (~~LOC~~) NOT AT ARMC
Chlamydia: NEGATIVE
Neisseria Gonorrhea: NEGATIVE

## 2014-12-08 ENCOUNTER — Ambulatory Visit: Payer: PPO | Admitting: Obstetrics & Gynecology

## 2014-12-17 ENCOUNTER — Ambulatory Visit (INDEPENDENT_AMBULATORY_CARE_PROVIDER_SITE_OTHER): Payer: PPO | Admitting: Obstetrics & Gynecology

## 2014-12-17 ENCOUNTER — Other Ambulatory Visit: Payer: Self-pay | Admitting: Family Medicine

## 2014-12-17 VITALS — BP 134/91 | HR 95 | Temp 98.2°F | Wt 235.7 lb

## 2014-12-17 DIAGNOSIS — N920 Excessive and frequent menstruation with regular cycle: Secondary | ICD-10-CM | POA: Diagnosis not present

## 2014-12-17 DIAGNOSIS — N898 Other specified noninflammatory disorders of vagina: Secondary | ICD-10-CM

## 2014-12-17 NOTE — Progress Notes (Signed)
Patient ID: Kristie [name], female   DOB: [birth year], 49 y.o.   MRN: [id number] Pt had hospitalization after IUD, IUD in place, visualized strings.  Pt with several diagnosis from encounter.  Pt reports spotting today, is not taking megace.  Pt is expressing difficulty with emotions and is scheduled to see primary care physician on Monday and diabetic physician on Tuesday. Pt reports room spinning sitting in the chair.
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
## 2014-12-17 NOTE — Patient Instructions (Signed)
Levonorgestrel intrauterine device (IUD) What is this medicine? LEVONORGESTREL IUD (LEE voe nor jes trel) is a contraceptive (birth control) device. The device is placed inside the uterus by a healthcare professional. It is used to prevent pregnancy and can also be used to treat heavy bleeding that occurs during your period. Depending on the device, it can be used for 3 to 5 years. This medicine may be used for other purposes; ask your health care provider or pharmacist if you have questions. COMMON BRAND NAME(S): LILETTA, Mirena, Skyla What should I tell my health care provider before I take this medicine? They need to know if you have any of these conditions: -abnormal Pap smear -cancer of the breast, uterus, or cervix -diabetes -endometritis -genital or pelvic infection now or in the past -have more than one sexual partner or your partner has more than one partner -heart disease -history of an ectopic or tubal pregnancy -immune system problems -IUD in place -liver disease or tumor -problems with blood clots or take blood-thinners -use intravenous drugs -uterus of unusual shape -vaginal bleeding that has not been explained -an unusual or allergic reaction to levonorgestrel, other hormones, silicone, or polyethylene, medicines, foods, dyes, or preservatives -pregnant or trying to get pregnant -breast-feeding How should I use this medicine? This device is placed inside the uterus by a health care professional. Talk to your pediatrician regarding the use of this medicine in children. Special care may be needed. Overdosage: If you think you have taken too much of this medicine contact a poison control center or emergency room at once. NOTE: This medicine is only for you. Do not share this medicine with others. What if I miss a dose? This does not apply. What may interact with this medicine? Do not take this medicine with any of the following  medications: -amprenavir -bosentan -fosamprenavir This medicine may also interact with the following medications: -aprepitant -barbiturate medicines for inducing sleep or treating seizures -bexarotene -griseofulvin -medicines to treat seizures like carbamazepine, ethotoin, felbamate, oxcarbazepine, phenytoin, topiramate -modafinil -pioglitazone -rifabutin -rifampin -rifapentine -some medicines to treat HIV infection like atazanavir, indinavir, lopinavir, nelfinavir, tipranavir, ritonavir -St. John's wort -warfarin This list may not describe all possible interactions. Give your health care provider a list of all the medicines, herbs, non-prescription drugs, or dietary supplements you use. Also tell them if you smoke, drink alcohol, or use illegal drugs. Some items may interact with your medicine. What should I watch for while using this medicine? Visit your doctor or health care professional for regular check ups. See your doctor if you or your partner has sexual contact with others, becomes HIV positive, or gets a sexual transmitted disease. This product does not protect you against HIV infection (AIDS) or other sexually transmitted diseases. You can check the placement of the IUD yourself by reaching up to the top of your vagina with clean fingers to feel the threads. Do not pull on the threads. It is a good habit to check placement after each menstrual period. Call your doctor right away if you feel more of the IUD than just the threads or if you cannot feel the threads at all. The IUD may come out by itself. You may become pregnant if the device comes out. If you notice that the IUD has come out use a backup birth control method like condoms and call your health care provider. Using tampons will not change the position of the IUD and are okay to use during your period. What side effects may   I notice from receiving this medicine? Side effects that you should report to your doctor or  health care professional as soon as possible: -allergic reactions like skin rash, itching or hives, swelling of the face, lips, or tongue -fever, flu-like symptoms -genital sores -high blood pressure -no menstrual period for 6 weeks during use -pain, swelling, warmth in the leg -pelvic pain or tenderness -severe or sudden headache -signs of pregnancy -stomach cramping -sudden shortness of breath -trouble with balance, talking, or walking -unusual vaginal bleeding, discharge -yellowing of the eyes or skin Side effects that usually do not require medical attention (report to your doctor or health care professional if they continue or are bothersome): -acne -breast pain -change in sex drive or performance -changes in weight -cramping, dizziness, or faintness while the device is being inserted -headache -irregular menstrual bleeding within first 3 to 6 months of use -nausea This list may not describe all possible side effects. Call your doctor for medical advice about side effects. You may report side effects to FDA at 1-800-FDA-1088. Where should I keep my medicine? This does not apply. NOTE: This sheet is a summary. It may not cover all possible information. If you have questions about this medicine, talk to your doctor, pharmacist, or health care provider.  2015, [employer]vier/Gold Standard. (2011-08-02 13:54:04)  

## 2014-12-18 LAB — WET PREP, GENITAL: Trich, Wet Prep: NONE SEEN

## 2014-12-19 ENCOUNTER — Encounter: Payer: Self-pay | Admitting: Obstetrics & Gynecology

## 2014-12-20 ENCOUNTER — Ambulatory Visit (INDEPENDENT_AMBULATORY_CARE_PROVIDER_SITE_OTHER): Payer: PPO | Admitting: Family Medicine

## 2014-12-20 ENCOUNTER — Encounter: Payer: Self-pay | Admitting: Family Medicine

## 2014-12-20 ENCOUNTER — Telehealth: Payer: Self-pay

## 2014-12-20 VITALS — BP 120/85 | HR 100 | Temp 98.0°F | Resp 16 | Wt 237.0 lb

## 2014-12-20 DIAGNOSIS — R358 Other polyuria: Secondary | ICD-10-CM

## 2014-12-20 DIAGNOSIS — R3589 Other polyuria: Secondary | ICD-10-CM

## 2014-12-20 DIAGNOSIS — R300 Dysuria: Secondary | ICD-10-CM

## 2014-12-20 DIAGNOSIS — E876 Hypokalemia: Secondary | ICD-10-CM | POA: Diagnosis not present

## 2014-12-20 DIAGNOSIS — I951 Orthostatic hypotension: Secondary | ICD-10-CM | POA: Diagnosis not present

## 2014-12-20 DIAGNOSIS — E1065 Type 1 diabetes mellitus with hyperglycemia: Secondary | ICD-10-CM

## 2014-12-20 DIAGNOSIS — IMO0002 Reserved for concepts with insufficient information to code with codable children: Secondary | ICD-10-CM

## 2014-12-20 DIAGNOSIS — R42 Dizziness and giddiness: Secondary | ICD-10-CM

## 2014-12-20 LAB — POCT URINALYSIS DIPSTICK
Bilirubin, UA: NEGATIVE
Glucose, UA: 100
Ketones, UA: NEGATIVE
Nitrite, UA: NEGATIVE
PROTEIN UA: NEGATIVE
Spec Grav, UA: <=1.005
Urobilinogen, UA: 0.2
pH, UA: 5.5

## 2014-12-20 LAB — BASIC METABOLIC PANEL
BUN: 9 mg/dL (ref 6–23)
CO2: 24 meq/L (ref 19–32)
Calcium: 8.8 mg/dL (ref 8.4–10.5)
Chloride: 106 mEq/L (ref 96–112)
Creatinine, Ser: 0.73 mg/dL (ref 0.40–1.20)
GFR: 90.05 mL/min (ref >60.00)
GLUCOSE: 78 mg/dL (ref 70–99)
POTASSIUM: 3.9 meq/L (ref 3.5–5.1)
Sodium: 137 mEq/L (ref 135–145)

## 2014-12-20 LAB — CBC WITH DIFFERENTIAL/PLATELET
Basophils Absolute: 0.1 10*3/uL (ref 0.0–0.1)
Basophils Relative: 1.1 % (ref 0.0–3.0)
Eosinophils Absolute: 0.1 10*3/uL (ref 0.0–0.7)
Eosinophils Relative: 2.2 % (ref 0.0–5.0)
HCT: 35.6 % — ABNORMAL LOW (ref 36.0–46.0)
HEMOGLOBIN: 11.4 g/dL — AB (ref 12.0–15.0)
Lymphocytes Relative: 30.6 % (ref 12.0–46.0)
Lymphs Abs: 2.1 10*3/uL (ref 0.7–4.0)
MCHC: 32.1 g/dL (ref 30.0–36.0)
MCV: 82 fl (ref 78.0–100.0)
Monocytes Absolute: 0.7 10*3/uL (ref 0.1–1.0)
Monocytes Relative: 9.7 % (ref 3.0–12.0)
NEUTROS ABS: 3.9 10*3/uL (ref 1.4–7.7)
NEUTROS PCT: 56.4 % (ref 43.0–77.0)
Platelets: 362 10*3/uL (ref 150.0–400.0)
RBC: 4.34 Mil/uL (ref 3.87–5.11)
RDW: 16.3 % — ABNORMAL HIGH (ref 11.5–15.5)
WBC: 6.8 10*3/uL (ref 4.0–10.5)

## 2014-12-20 LAB — MAGNESIUM: Magnesium: 2 mg/dL (ref 1.5–2.5)

## 2014-12-20 LAB — HEMOGLOBIN A1C: Hgb A1c MFr Bld: 12 % — ABNORMAL HIGH (ref 4.6–6.5)

## 2014-12-20 MED ORDER — FLUCONAZOLE 150 MG PO TABS: 150.0000 mg | ORAL_TABLET | Freq: Once | ORAL | Status: DC

## 2014-12-20 NOTE — Progress Notes (Signed)
Pre visit review using our clinic review tool, if applicable. No additional management support is needed unless otherwise documented below in the visit note. 

## 2014-12-20 NOTE — Telephone Encounter (Signed)
Diflucan 150mg  RX per protocol for yeast infection. Called patient and informed her of results. Patient verbalized understanding and gratitude. No questions or concerns.
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
## 2014-12-22 LAB — URINE CULTURE

## 2014-12-23 ENCOUNTER — Ambulatory Visit: Payer: PPO | Admitting: Internal Medicine

## 2014-12-24 ENCOUNTER — Ambulatory Visit (INDEPENDENT_AMBULATORY_CARE_PROVIDER_SITE_OTHER): Payer: PPO | Admitting: Internal Medicine

## 2014-12-24 ENCOUNTER — Other Ambulatory Visit: Payer: Self-pay | Admitting: *Deleted

## 2014-12-24 ENCOUNTER — Telehealth: Payer: Self-pay | Admitting: Family Medicine

## 2014-12-24 ENCOUNTER — Encounter: Payer: Self-pay | Admitting: Internal Medicine

## 2014-12-24 VITALS — BP 124/76 | HR 93 | Temp 97.7°F | Resp 12 | Wt 236.0 lb

## 2014-12-24 DIAGNOSIS — E1065 Type 1 diabetes mellitus with hyperglycemia: Secondary | ICD-10-CM

## 2014-12-24 DIAGNOSIS — IMO0002 Reserved for concepts with insufficient information to code with codable children: Secondary | ICD-10-CM

## 2014-12-24 MED ORDER — INSULIN ASPART 100 UNIT/ML ~~LOC~~ SOLN: 100.0000 [IU] | SUBCUTANEOUS | Status: DC

## 2014-12-24 MED ORDER — GLUCOSE BLOOD VI STRP: ORAL_STRIP | Status: DC

## 2014-12-24 NOTE — Telephone Encounter (Signed)
Tyniah returned call from yesterday. Please call her with Lab results.

## 2014-12-24 NOTE — Progress Notes (Signed)
Patient ID: Kristie [name], female   DOB: 02/21/1966, 49 y.o.   MRN: [id number]  HPI: Kristie [name] is a 49 y.o.-year-old female, returning for follow-up for DM1, dx 2001 (GDM), uncontrolled, without complications. Last visit 7 mo ago!  She had an IUD at the beginning of 11/2014 >> " my life is a living hell": bleeding x 2 weeks after this; exhaustion, dizziness, palpitations, leg swelling, weight gain, irritability, depression >> now a little better, but still feels very poorly.   She is doing a better job in Kristie [name] with her meals.  Last hemoglobin A1c was: Lab Results  Component Value Date   HGBA1C 12.0* 12/20/2014   HGBA1C 9.8* 06/03/2014   HGBA1C 10.0* 02/16/2014   Pt is on an insulin pump: Medtronic 723 x 1 mo, without CGM, uses Novolog in the pump.  Pump settings: - basal rates:  12 am: 1.3 >> 1.4 >> please increase to 1.5 if sugars in the middle of the night are still high 6 am: 1.3 >> 1.5  12 pm: 1.2 >> 1.4 - ICR:  12 am: 7.0 11 am: 7.5 5 pm: 7.5 - target: 100-120 >> 100-110 - ISF: 30 - Insulin on Board: 4h- extended bolusing: not using - changes infusion site: q3 days - Meter: Bayer Contour Basal insulin tdd 55% (34 units) Bolus insulin tdd 45% (27.4 units)  Pt checks her sugars 3.6 a day and they are still high especially in am- ave 217 +/-68 >> worse: 250 +/-77:  - am: 133-293 >> 97, 197-251, 296 >> 129-251, >400 - 2h after b'fast: 150-395 >> 65, 129-181 >> 108-209 - before lunch: 165-240 >> 128-266 >> 157-267 - 2h after lunch: 109-337 >> 80, 201-273 >> 171-386 - before dinner: 55 x1 (very busy that day), 133-275 >> 78, 172-289 >> 181-296 - 2h after dinner: 127, 213-345 >> 96, 163-243 >> 157-370 - bedtime: 90, 213, 301 >> 208-220 >> see above - nighttime: 261 >> x1 122 >> 211-334 He does  upload her sugars. No lows. Lowest sugar was 37 >> 65 >> 108; she has hypoglycemia awareness at 60s. No previous hypoglycemia admission.  Highest sugar was HI >> 350. Had  1x previous DKA admissions: admitted for 10 days (in 07/2013).    She is seeing Kristie [name] in DM education.  Pt's meals are: - Breakfast: 2 eggs, toast, cheese - Lunch: sandwich - Dinner: backed chicken or porkchops - Snacks: yoghurt, fruit She used to walk for exercise 2x a week, plans to restart when she is feeling better.  - no CKD, last BUN/creatinine:  Lab Results  Component Value Date   BUN 9 12/20/2014   CREATININE 0.73 12/20/2014  Not on an ACEI. - last set of lipids: Lab Results  Component Value Date   CHOL 196 02/16/2014   HDL 58.10 02/16/2014   LDLCALC 128* 02/16/2014   LDLDIRECT 167.1 04/02/2013   TRIG 48.0 02/16/2014   CHOLHDL 3 02/16/2014  Not on a statin. - last eye exam was in 10/29/2013. No DR.  - + numbness and tingling in her feet (mostly big toes)  Last TSH was normal: Lab Results  Component Value Date   TSH 1.278 12/04/2014   I reviewed pt's medications, allergies, PMH, social hx, family hx, and changes were documented in the history of present illness. Otherwise, unchanged from my initial visit note.  ROS: Constitutional: + weight gain, + increased appetite, + fatigue, no subjective hypothermia,+  dysuria Eyes: +blurry vision, no xerophthalmia ENT: no sore  throat, no nodules palpated in throat, no dysphagia/odynophagia, no hoarseness Cardiovascular: no CP/+ SOB/no palpitations/+ leg swelling Respiratory: no cough/+ SOB Gastrointestinal:+ N/+ V/no D/C Musculoskeletal: no muscle/joint aches Skin: no rashes, + itching Neurological: no tremors/numbness/tingling/dizziness, + HA + low libido  PE: BP 124/76 mmHg  Pulse 93  Temp(Src) 97.7 F (36.5 C) (Oral)  Resp 12  Wt 236 lb (107.049 kg)  SpO2 96%  LMP  Body mass index is 36.95 kg/(m^2).  Wt Readings from Last 3 Encounters:  12/24/14 236 lb (107.049 kg)  12/20/14 237 lb (107.502 kg)  12/17/14 235 lb 11.2 oz (106.913 kg)   Constitutional: overweight, in NAD Eyes: PERRLA, EOMI, no  exophthalmos ENT: moist mucous membranes, no thyromegaly, no cervical lymphadenopathy Cardiovascular: RRR, No MRG Respiratory: CTA B Gastrointestinal: abdomen soft, NT, ND, BS+ Musculoskeletal: no deformities, strength intact in all 4 Skin: moist, warm, no rashes Neurological: no tremor with outstretched hands, DTR normal in all 4  ASSESSMENT: 1. DM1, uncontrolled, without complications (?PN) - on pump Component     Latest Ref Rng 09/24/2013  Glucose, Fasting     70 - 99 mg/dL 121 (H)  C-Peptide     0.80 - 3.90 ng/mL <0.10 (L)   PLAN:  1. Patient with long-standing, uncontrolled DM1, on insulin pump therapy,, returning after a 7 mo absence. Sugars uniformly higher than goal (and new HbA1c 12%!), with an average of 250. She has been feeling very poorly after placement of her IUD. She will have this removed in the near future. There are no discernible CBG patterns, except slight decrease in sugars after breakfast. I gave the patient sick day rules and advised her that she may need to take a low dose of insulin even if she does not eat if the sugars stay high. - We discussed about changes to his insulin regimen, as follows >> increase basal rates and decrease ICR with lunch and dinner. We'll also decrease her ISF. I asked her to to check her sugars more frequently in the next few days to make sure she does not have lows, since he made quite a few increases in the insulin dose:  Patient Instructions  Please change the pump settings as follows: - basal rates:  12 am: 1.4 >> 1.7 6 am: 1.5 >> 1.8  12 pm: 1.4 >> 1.7 Please continue to increase the basal rates by 0.1 units/h if sugars stay high during the respective period. - ICR:  12 am: 7.0  11 am: 7.5 >> 7.0 5 pm: 7.5 >> 7.0 - target: 100-110 - ISF: 30 >> 25 - Insulin on Board: 4h  Please schedule an appt with Kristie [name] for diabetes education in 3-4 weeks.  Please return in 2 months.   - continue checking sugars at different  times of the day - check at least 4 times a day, rotating checks - advised for yearly eye exams >> she  needs a new eye exam - already seeing DM education >> recommended to see Kristie [name] again a month  - check HbA1c at next visit - We'll start the PA for Bayer Contour test strips - refilled her insulin - 4 vials a month  - no signs of other autoimmune disorders - reviewed latest TSH - Return to clinic in 2 mo with sugar log   - time spent with the patient: 40 min, of which >50% was spent in reviewing her pump download, discussing her hypo- and hyper-glycemic episodes, reviewing her previous labs and pump settings and  developing a plan to avoid hypo- an hyper-glycemia.

## 2014-12-24 NOTE — Patient Instructions (Addendum)
Please change the pump settings as follows: - basal rates:  12 am: 1.4 >> 1.7 6 am: 1.5 >> 1.8  12 pm: 1.4 >> 1.7 Please continue to increase the basal rates by 0.1 units/h if sugars stay high during the respective period. - ICR:  12 am: 7.0  11 am: 7.5 >> 7.0 5 pm: 7.5 >> 7.0 - target: 100-110 - ISF: 30 >> 25 - Insulin on Board: 4h  Please schedule an appt with [name] for diabetes education in 3-4 weeks.  Please return in 2 months.    Diabetes and Sick Day Management Blood sugar (glucose) can be more difficult to control when you are sick. Colds, fever, flu, nausea, vomiting, and diarrhea are all examples of common illnesses that can cause problems for people with diabetes. Loss of body fluids (dehydration) from fever, vomiting, diarrhea, infection, and the stress of a sickness can all cause blood glucose levels to increase. Because of this, it is very important to take your diabetes medicines and to eat some form of carbohydrate food when you are sick. Liquid or soft foods are often tolerated, and they help to replace fluids. HOME CARE INSTRUCTIONS These main guidelines are intended for managing a short-term (24 hours or less) sickness:  Take your usual dose of insulin or oral diabetes medicine. An exception would be if you take any form of metformin. If you cannot eat or drink, you can become dehydrated and should not take this medicine.  Continue to take your insulin even if you are unable to eat solid foods or are vomiting. Your insulin dose may stay the same, or it may need to be increased when you are sick.  You will need to test your blood glucose more often, generally every 2-4 hours. If you have type 1 diabetes, test your urine for ketones every 4 hours. If you have type 2 diabetes, test your urine for ketones as directed by your health care provider.  Eat some form of food that contains carbohydrates. The carbohydrates can be in solid or liquid form. You should eat  45-50 g of carbohydrates every 3-4 hours.  Replace fluids if you have a fever, vomit, or have diarrhea. Ask your health care provider for specific rehydration instructions.  Watch carefully for the signs of ketoacidosis if you have type 1 diabetes. Call your health care provider if any of the following symptoms are present, especially in children:  Moderate to large ketones in the urine along with a high blood glucose level.  Severe nausea.  Vomiting.  Diarrhea.  Abdominal pain.  Rapid breathing.  Drink extra liquids that do not contain sugar such as water.  Be careful with over-the-counter medicines. Read the labels. They may contain sugar or types of sugars that can increase your blood glucose level. Food Choices for Illness All of the food choices below contain about 15 g of carbohydrates. Plan ahead and keep some of these foods around.    to  cup carbonated beverage containing sugar. Carbonated beverages will usually be better tolerated if they are opened and left at room temperature for a few minutes.   of a twin frozen ice pop.   cup regular gelatin.   cup juice.   cup ice cream or frozen yogurt.   cup cooked cereal.   cup sherbet.  1 cup clear broth or soup.  1 cup cream soup.   cup regular custard.   cup regular pudding.  1 cup sports drink.  1 cup plain  yogurt.  1 slice toast.  6 squares saltine crackers.  5 vanilla wafers. SEEK MEDICAL CARE IF:   You are unable to drink fluids, even small amounts.  You have nausea and vomiting for more than 6 hours.  You have diarrhea for more than 6 hours.  Your blood glucose level is more than 240 mg/dL, even with additional insulin.  There is a change in mental status.  You develop an additional serious sickness.  You have been sick for 2 days and are not getting better.  You have a fever. SEEK IMMEDIATE MEDICAL CARE IF:  You have difficulty breathing.  You have moderate to large  ketone levels. MAKE SURE YOU:  Understand these instructions.  Will watch your condition.  Will get help right away if you are not doing well or get worse. Document Released: 07/05/2003 Document Revised: 11/16/2013 Document Reviewed: 12/09/2012 [facility] Patient Information 2015 [place], [place]. This information is not intended to replace advice given to you by your health care provider. Make sure you discuss any questions you have with your health care provider.

## 2014-12-27 ENCOUNTER — Encounter: Payer: Self-pay | Admitting: Family Medicine

## 2014-12-27 ENCOUNTER — Ambulatory Visit (INDEPENDENT_AMBULATORY_CARE_PROVIDER_SITE_OTHER): Payer: PPO | Admitting: Family Medicine

## 2014-12-27 VITALS — BP 110/64 | HR 95 | Temp 98.2°F | Resp 16 | Wt 230.0 lb

## 2014-12-27 DIAGNOSIS — E1069 Type 1 diabetes mellitus with other specified complication: Secondary | ICD-10-CM | POA: Diagnosis not present

## 2014-12-27 DIAGNOSIS — IMO0002 Reserved for concepts with insufficient information to code with codable children: Secondary | ICD-10-CM

## 2014-12-27 DIAGNOSIS — T50905D Adverse effect of unspecified drugs, medicaments and biological substances, subsequent encounter: Secondary | ICD-10-CM

## 2014-12-27 DIAGNOSIS — E1065 Type 1 diabetes mellitus with hyperglycemia: Secondary | ICD-10-CM

## 2014-12-27 DIAGNOSIS — R110 Nausea: Secondary | ICD-10-CM

## 2014-12-27 DIAGNOSIS — E108 Type 1 diabetes mellitus with unspecified complications: Secondary | ICD-10-CM

## 2014-12-27 MED ORDER — PROMETHAZINE HCL 12.5 MG PO TABS: 12.5000 mg | ORAL_TABLET | Freq: Four times a day (QID) | ORAL | Status: DC | PRN

## 2014-12-27 NOTE — Progress Notes (Signed)
Pre visit review using our clinic review tool, if applicable. No additional management support is needed unless otherwise documented below in the visit note. 

## 2014-12-27 NOTE — Progress Notes (Signed)
OFFICE NOTE  12/27/2014  CC:  Chief Complaint  Patient presents with  . Follow-up    1 week f/u.    HPI: Patient is a 49 y.o. Caucasian female who is here for 1 week f/u swelling, moodiness, some orthostatic dizziness, and overall feeling of malaise.  She has felt this way since getting Mirena inserted 11/2014. She saw her endocrinologist last week for a long overdue f/u and her pump settings were adjusted. Endo feels like she needs her Mirena removed as well.  Pt still with significant feeling of bloating, feels like extremities swelling, feels nauseated a lot, feeling lightheaded a lot.    Pertinent PMH:  Past medical, surgical, social, and family history reviewed and no changes are noted since last office visit.  MEDS:  Outpatient Prescriptions Prior to Visit  Medication Sig Dispense Refill  . albuterol (VENTOLIN HFA) 108 (90 BASE) MCG/ACT inhaler Inhale 1-2 puffs into the lungs every 4 (four) hours as needed for wheezing or shortness of breath. 1 Inhaler 0  . atorvastatin (LIPITOR) 40 MG tablet TAKE ONE TABLET BY MOUTH ONCE DAILY 90 tablet 1  . clonazePAM (KLONOPIN) 1 MG tablet 1 tab a.m. 1 tab at 2p and 2 tabs at bedtime (Patient taking differently: Take 1-2 mg by mouth 3 (three) times daily. 1 tab a.m. 1 tab at 2p and 2 tabs at bedtime) 120 tablet 5  . docusate sodium (COLACE) 100 MG capsule Take 200 mg by mouth 3 (three) times daily.    . ferrous sulfate 325 (65 FE) MG EC tablet Take 1 tablet (325 mg total) by mouth 2 (two) times daily.    . fluconazole (DIFLUCAN) 150 MG tablet Take 1 tablet (150 mg total) by mouth once. 1 tablet 0  . fluticasone (FLONASE) 50 MCG/ACT nasal spray Place 1 spray into both nostrils daily as needed for allergies or rhinitis.     Marland Kitchen FREESTYLE LITE test strip Test 6 times daily 550 each 2  . glucose blood (BAYER CONTOUR TEST) test strip Use to test blood sugar 6 times daily as instructed. Dx: E10.65 180 each 11  . insulin aspart (NOVOLOG) 100 UNIT/ML  injection Inject 100-110 Units into the skin continuous. Use as instructed via insulin pump 40 mL 2  . PHARMACIST CHOICE LANCETS MISC Test 6 times daily 600 each 2  . venlafaxine XR (EFFEXOR-XR) 150 MG 24 hr capsule Take 2 capsules (300 mg total) by mouth daily. 180 capsule 4   No facility-administered medications prior to visit.    PE: Blood pressure 110/64, pulse 95, temperature 98.2 F (36.8 C), temperature source Oral, resp. rate 16, weight 230 lb (104.327 kg), SpO2 97 %. Gen: Alert, well appearing.  Patient is oriented to person, place, time, and situation. AFFECT: pleasant, lucid thought and speech. MGQ:QPYP: no injection, icteris, swelling, or exudate.  EOMI, PERRLA. Mouth: lips without lesion/swelling.  Oral mucosa pink and moist. Oropharynx without erythema, exudate, or swelling.  CV: RRR, no m/r/g.   LUNGS: CTA bilat, nonlabored resps, good aeration in all lung fields. Neuro: CN 2-12 intact bilaterally, strength 5/5 in proximal and distal upper extremities and lower extremities bilaterally.    No tremor.   No ataxia. No pronator drift.   LAB:  Lab Results  Component Value Date   WBC 6.8 12/20/2014   HGB 11.4* 12/20/2014   HCT 35.6* 12/20/2014   MCV 82.0 12/20/2014   PLT 362.0 12/20/2014     Chemistry      Component Value Date/Time  NA 137 12/20/2014 1215   K 3.9 12/20/2014 1215   CL 106 12/20/2014 1215   CO2 24 12/20/2014 1215   BUN 9 12/20/2014 1215   CREATININE 0.73 12/20/2014 1215      Component Value Date/Time   CALCIUM 8.8 12/20/2014 1215   ALKPHOS 106 12/04/2014 0135   AST 11* 12/04/2014 0135   ALT 14 12/04/2014 0135   BILITOT 1.1 12/04/2014 0135     Lab Results  Component Value Date   TSH 1.278 12/04/2014     IMPRESSION AND PLAN:  1) Levonorgestrel/Mirena IUD side effects + uncontrolled DM. Continue with endo's pump changes recently made, adjust diet. Take phenergan 12.5mg  q6h prn nausea (rx'd today). She needs to get back in with her GYN MD  to request that her IUD be removed, possibly consider non-hormonal IUD.  An After Visit Summary was printed and given to the patient.  FOLLOW UP: 4 mo

## 2014-12-28 ENCOUNTER — Encounter: Payer: Self-pay | Admitting: General Practice

## 2014-12-31 NOTE — Telephone Encounter (Signed)
Pt advised and voiced understanding.   

## 2015-01-03 ENCOUNTER — Telehealth: Payer: Self-pay | Admitting: Internal Medicine

## 2015-01-03 MED ORDER — GLUCOSE BLOOD VI STRP: ORAL_STRIP | Status: DC

## 2015-01-03 NOTE — Telephone Encounter (Signed)
Rx sent 

## 2015-01-03 NOTE — Telephone Encounter (Signed)
To hold the pt over please call in a rx for 30 days for the freestyle test strips ASAp  The form for the approval for the bayer supplies is coming over now

## 2015-01-05 ENCOUNTER — Other Ambulatory Visit: Payer: Self-pay | Admitting: Family Medicine

## 2015-01-05 MED ORDER — VENLAFAXINE HCL ER 150 MG PO CP24: 300.0000 mg | ORAL_CAPSULE | Freq: Every day | ORAL | Status: DC

## 2015-01-05 NOTE — Telephone Encounter (Signed)
Dr. Anitra Lauth pt. RF request for venlafaxine  LOV: 12/27/14 Next ov: 04/28/15 Last written: 10/15/13 #180 w/ 4RF Please advise. Thanks.

## 2015-01-05 NOTE — Telephone Encounter (Signed)
Dr. Anitra Lauth pt. RF request for Clonazepam.  LOV: 12/27/14 Next ov:  04/28/15 Last written: 05/20/14 #120 w/ 5RF Please advised. Thanks.

## 2015-01-10 ENCOUNTER — Other Ambulatory Visit: Payer: Self-pay

## 2015-01-18 ENCOUNTER — Encounter: Payer: PPO | Attending: Internal Medicine | Admitting: Nutrition

## 2015-01-18 DIAGNOSIS — Z713 Dietary counseling and surveillance: Secondary | ICD-10-CM | POA: Insufficient documentation

## 2015-01-18 DIAGNOSIS — IMO0002 Reserved for concepts with insufficient information to code with codable children: Secondary | ICD-10-CM

## 2015-01-18 DIAGNOSIS — Z794 Long term (current) use of insulin: Secondary | ICD-10-CM | POA: Insufficient documentation

## 2015-01-18 DIAGNOSIS — E1065 Type 1 diabetes mellitus with hyperglycemia: Secondary | ICD-10-CM | POA: Insufficient documentation

## 2015-01-18 NOTE — Progress Notes (Signed)
Pump download shows that patient is testing 1-2 X/day.  FBSs 77-135 with one reading 210.  ACS readings are 186-375, with most in the 240-270  Range.   One HS reading of 98 at 10:30PM, with FBS (10AM) of 119.    She appears to be bolusing for all meals, though some days she is not bolusing but once a day.    She is using the exchange system, and appears to be figuring this correctly.   She reports that she is in the donut hole with her insurance.  She was given a [employer] financial assistance form to fill out and return to [place] to help get her insulin-- which she says she can not afford.    She also questioned how to put in an exercise marker into her pump before she goes out walking.  She was shown how to do this, and she re demonstrated this correctly to me.  Written instructions were given to her for this as well.    Changes made to pump settings:   Basal increased from 11AM to MN by 0.1u/hr.(1.8) Sensitivity changed from 25 to 20.    Pt. To call in one week with acS readings, or sooner if dropping low.  Telephone number given.  Pt. To test blood sugars at HS and do a correction bolus at that time.

## 2015-01-18 NOTE — Patient Instructions (Signed)
Call in one week with blood sugar readings before supper.  Test blood sugars at bedtime and do a correction bolus at this time.

## 2015-01-26 ENCOUNTER — Ambulatory Visit (INDEPENDENT_AMBULATORY_CARE_PROVIDER_SITE_OTHER): Payer: PPO | Admitting: Obstetrics & Gynecology

## 2015-01-26 ENCOUNTER — Encounter: Payer: Self-pay | Admitting: Obstetrics & Gynecology

## 2015-01-26 VITALS — BP 120/86 | HR 84 | Temp 97.8°F | Wt 232.2 lb

## 2015-01-26 DIAGNOSIS — I951 Orthostatic hypotension: Secondary | ICD-10-CM | POA: Diagnosis not present

## 2015-01-26 DIAGNOSIS — Z30431 Encounter for routine checking of intrauterine contraceptive device: Secondary | ICD-10-CM | POA: Diagnosis not present

## 2015-01-26 NOTE — Progress Notes (Signed)
Patient ID: Kristie [name], female   DOB: 09-21-1965, 49 y.o.   MRN: [id number] Pt states that her Dr. Anitra Lauth recommended pt to have IUD removed due to high blood pressure, elevated blood sugars, elevated heart rate and elevated hemoglobin.
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
## 2015-01-26 NOTE — Progress Notes (Signed)
Patient ID: Kristie [name], female   DOB: 1965/10/20, 49 y.o.   MRN: [id number] Records request faxed to Dr. Garwin Brothers office.  Patient states she was seen there in 2014 and had pap there.

## 2015-01-26 NOTE — Patient Instructions (Signed)
Levonorgestrel intrauterine device (IUD) What is this medicine? LEVONORGESTREL IUD (LEE voe nor jes trel) is a contraceptive (birth control) device. The device is placed inside the uterus by a healthcare professional. It is used to prevent pregnancy and can also be used to treat heavy bleeding that occurs during your period. Depending on the device, it can be used for 3 to 5 years. This medicine may be used for other purposes; ask your health care provider or pharmacist if you have questions. COMMON BRAND NAME(S): LILETTA, Mirena, Skyla What should I tell my health care provider before I take this medicine? They need to know if you have any of these conditions: -abnormal Pap smear -cancer of the breast, uterus, or cervix -diabetes -endometritis -genital or pelvic infection now or in the past -have more than one sexual partner or your partner has more than one partner -heart disease -history of an ectopic or tubal pregnancy -immune system problems -IUD in place -liver disease or tumor -problems with blood clots or take blood-thinners -use intravenous drugs -uterus of unusual shape -vaginal bleeding that has not been explained -an unusual or allergic reaction to levonorgestrel, other hormones, silicone, or polyethylene, medicines, foods, dyes, or preservatives -pregnant or trying to get pregnant -breast-feeding How should I use this medicine? This device is placed inside the uterus by a health care professional. Talk to your pediatrician regarding the use of this medicine in children. Special care may be needed. Overdosage: If you think you have taken too much of this medicine contact a poison control center or emergency room at once. NOTE: This medicine is only for you. Do not share this medicine with others. What if I miss a dose? This does not apply. What may interact with this medicine? Do not take this medicine with any of the following  medications: -amprenavir -bosentan -fosamprenavir This medicine may also interact with the following medications: -aprepitant -barbiturate medicines for inducing sleep or treating seizures -bexarotene -griseofulvin -medicines to treat seizures like carbamazepine, ethotoin, felbamate, oxcarbazepine, phenytoin, topiramate -modafinil -pioglitazone -rifabutin -rifampin -rifapentine -some medicines to treat HIV infection like atazanavir, indinavir, lopinavir, nelfinavir, tipranavir, ritonavir -St. John's wort -warfarin This list may not describe all possible interactions. Give your health care provider a list of all the medicines, herbs, non-prescription drugs, or dietary supplements you use. Also tell them if you smoke, drink alcohol, or use illegal drugs. Some items may interact with your medicine. What should I watch for while using this medicine? Visit your doctor or health care professional for regular check ups. See your doctor if you or your partner has sexual contact with others, becomes HIV positive, or gets a sexual transmitted disease. This product does not protect you against HIV infection (AIDS) or other sexually transmitted diseases. You can check the placement of the IUD yourself by reaching up to the top of your vagina with clean fingers to feel the threads. Do not pull on the threads. It is a good habit to check placement after each menstrual period. Call your doctor right away if you feel more of the IUD than just the threads or if you cannot feel the threads at all. The IUD may come out by itself. You may become pregnant if the device comes out. If you notice that the IUD has come out use a backup birth control method like condoms and call your health care provider. Using tampons will not change the position of the IUD and are okay to use during your period. What side effects may   I notice from receiving this medicine? Side effects that you should report to your doctor or  health care professional as soon as possible: -allergic reactions like skin rash, itching or hives, swelling of the face, lips, or tongue -fever, flu-like symptoms -genital sores -high blood pressure -no menstrual period for 6 weeks during use -pain, swelling, warmth in the leg -pelvic pain or tenderness -severe or sudden headache -signs of pregnancy -stomach cramping -sudden shortness of breath -trouble with balance, talking, or walking -unusual vaginal bleeding, discharge -yellowing of the eyes or skin Side effects that usually do not require medical attention (report to your doctor or health care professional if they continue or are bothersome): -acne -breast pain -change in sex drive or performance -changes in weight -cramping, dizziness, or faintness while the device is being inserted -headache -irregular menstrual bleeding within first 3 to 6 months of use -nausea This list may not describe all possible side effects. Call your doctor for medical advice about side effects. You may report side effects to FDA at 1-800-FDA-1088. Where should I keep my medicine? This does not apply. NOTE: This sheet is a summary. It may not cover all possible information. If you have questions about this medicine, talk to your doctor, pharmacist, or health care provider.  2015, [employer]vier/Gold Standard. (2011-08-02 13:54:04)  

## 2015-02-07 ENCOUNTER — Other Ambulatory Visit: Payer: Self-pay

## 2015-02-07 MED ORDER — GLUCOSE BLOOD VI STRP: ORAL_STRIP | Status: DC

## 2015-02-17 ENCOUNTER — Encounter: Payer: PPO | Admitting: Family Medicine

## 2015-02-22 ENCOUNTER — Telehealth: Payer: Self-pay | Admitting: Endocrinology

## 2015-02-22 NOTE — Telephone Encounter (Signed)
Bayer contour test strips and supplies need to be called in to [facility] on [address]

## 2015-02-24 ENCOUNTER — Other Ambulatory Visit: Payer: Self-pay | Admitting: *Deleted

## 2015-02-24 MED ORDER — GLUCOSE BLOOD VI STRP: ORAL_STRIP | Status: DC

## 2015-02-25 ENCOUNTER — Other Ambulatory Visit: Payer: Self-pay | Admitting: *Deleted

## 2015-02-25 MED ORDER — GLUCOSE BLOOD VI STRP: ORAL_STRIP | Status: DC

## 2015-02-25 NOTE — Telephone Encounter (Signed)
Test strip rx must say Contour Next, not just contour. Resending the rx for the test strips.

## 2015-03-01 ENCOUNTER — Ambulatory Visit: Payer: PPO | Admitting: Internal Medicine

## 2015-03-17 ENCOUNTER — Emergency Department (HOSPITAL_COMMUNITY): Payer: PPO

## 2015-03-17 ENCOUNTER — Encounter (HOSPITAL_COMMUNITY): Payer: Self-pay | Admitting: *Deleted

## 2015-03-17 ENCOUNTER — Inpatient Hospital Stay (HOSPITAL_COMMUNITY)
Admission: EM | Admit: 2015-03-17 | Discharge: 2015-03-20 | DRG: 637 | Disposition: A | Discharge status: Home / Self Care | Payer: PPO | Attending: Internal Medicine | Admitting: Internal Medicine

## 2015-03-17 DIAGNOSIS — Z22322 Carrier or suspected carrier of Methicillin resistant Staphylococcus aureus: Secondary | ICD-10-CM

## 2015-03-17 DIAGNOSIS — Z9119 Patient's noncompliance with other medical treatment and regimen: Secondary | ICD-10-CM | POA: Diagnosis present

## 2015-03-17 DIAGNOSIS — E785 Hyperlipidemia, unspecified: Secondary | ICD-10-CM | POA: Diagnosis present

## 2015-03-17 DIAGNOSIS — Z9641 Presence of insulin pump (external) (internal): Secondary | ICD-10-CM | POA: Diagnosis present

## 2015-03-17 DIAGNOSIS — E876 Hypokalemia: Secondary | ICD-10-CM | POA: Diagnosis present

## 2015-03-17 DIAGNOSIS — D509 Iron deficiency anemia, unspecified: Secondary | ICD-10-CM | POA: Diagnosis present

## 2015-03-17 DIAGNOSIS — IMO0002 Reserved for concepts with insufficient information to code with codable children: Secondary | ICD-10-CM

## 2015-03-17 DIAGNOSIS — E669 Obesity, unspecified: Secondary | ICD-10-CM | POA: Diagnosis present

## 2015-03-17 DIAGNOSIS — E1310 Other specified diabetes mellitus with ketoacidosis without coma: Principal | ICD-10-CM | POA: Diagnosis present

## 2015-03-17 DIAGNOSIS — T383X6A Underdosing of insulin and oral hypoglycemic [antidiabetic] drugs, initial encounter: Secondary | ICD-10-CM | POA: Diagnosis present

## 2015-03-17 DIAGNOSIS — Y92009 Unspecified place in unspecified non-institutional (private) residence as the place of occurrence of the external cause: Secondary | ICD-10-CM

## 2015-03-17 DIAGNOSIS — R0682 Tachypnea, not elsewhere classified: Secondary | ICD-10-CM | POA: Diagnosis present

## 2015-03-17 DIAGNOSIS — Z833 Family history of diabetes mellitus: Secondary | ICD-10-CM

## 2015-03-17 DIAGNOSIS — D72829 Elevated white blood cell count, unspecified: Secondary | ICD-10-CM | POA: Diagnosis present

## 2015-03-17 DIAGNOSIS — E0810 Diabetes mellitus due to underlying condition with ketoacidosis without coma: Secondary | ICD-10-CM

## 2015-03-17 DIAGNOSIS — Z6838 Body mass index (BMI) 38.0-38.9, adult: Secondary | ICD-10-CM

## 2015-03-17 DIAGNOSIS — M549 Dorsalgia, unspecified: Secondary | ICD-10-CM | POA: Diagnosis present

## 2015-03-17 DIAGNOSIS — G9341 Metabolic encephalopathy: Secondary | ICD-10-CM | POA: Diagnosis present

## 2015-03-17 DIAGNOSIS — R0600 Dyspnea, unspecified: Secondary | ICD-10-CM

## 2015-03-17 DIAGNOSIS — N179 Acute kidney failure, unspecified: Secondary | ICD-10-CM | POA: Diagnosis present

## 2015-03-17 DIAGNOSIS — E872 Acidosis, unspecified: Secondary | ICD-10-CM | POA: Diagnosis present

## 2015-03-17 DIAGNOSIS — I10 Essential (primary) hypertension: Secondary | ICD-10-CM | POA: Diagnosis present

## 2015-03-17 DIAGNOSIS — E1065 Type 1 diabetes mellitus with hyperglycemia: Secondary | ICD-10-CM

## 2015-03-17 DIAGNOSIS — Z809 Family history of malignant neoplasm, unspecified: Secondary | ICD-10-CM

## 2015-03-17 DIAGNOSIS — E1110 Type 2 diabetes mellitus with ketoacidosis without coma: Secondary | ICD-10-CM | POA: Diagnosis present

## 2015-03-17 DIAGNOSIS — F419 Anxiety disorder, unspecified: Secondary | ICD-10-CM | POA: Diagnosis present

## 2015-03-17 DIAGNOSIS — Z79899 Other long term (current) drug therapy: Secondary | ICD-10-CM

## 2015-03-17 DIAGNOSIS — R400 Somnolence: Secondary | ICD-10-CM

## 2015-03-17 DIAGNOSIS — Z794 Long term (current) use of insulin: Secondary | ICD-10-CM

## 2015-03-17 DIAGNOSIS — R4182 Altered mental status, unspecified: Secondary | ICD-10-CM | POA: Diagnosis not present

## 2015-03-17 HISTORY — DX: Carrier or suspected carrier of methicillin resistant Staphylococcus aureus: Z22.322

## 2015-03-17 LAB — CBC WITH DIFFERENTIAL/PLATELET
Basophils Absolute: 0 10*3/uL (ref 0.0–0.1)
Basophils Relative: 0 % (ref 0–1)
EOS PCT: 0 % (ref 0–5)
Eosinophils Absolute: 0 10*3/uL (ref 0.0–0.7)
HEMATOCRIT: 46.1 % — AB (ref 36.0–46.0)
Hemoglobin: 15.2 g/dL — ABNORMAL HIGH (ref 12.0–15.0)
LYMPHS ABS: 1.5 10*3/uL (ref 0.7–4.0)
Lymphocytes Relative: 5 % — ABNORMAL LOW (ref 12–46)
MCH: 26.1 pg (ref 26.0–34.0)
MCHC: 33 g/dL (ref 30.0–36.0)
MCV: 79.2 fL (ref 78.0–100.0)
MONOS PCT: 7 % (ref 3–12)
Monocytes Absolute: 2 10*3/uL — ABNORMAL HIGH (ref 0.1–1.0)
Neutro Abs: 25.6 10*3/uL — ABNORMAL HIGH (ref 1.7–7.7)
Neutrophils Relative %: 88 % — ABNORMAL HIGH (ref 43–77)
Platelets: 390 10*3/uL (ref 150–400)
RBC: 5.82 MIL/uL — AB (ref 3.87–5.11)
RDW: 15.6 % — AB (ref 11.5–15.5)
WBC: 29.1 10*3/uL — AB (ref 4.0–10.5)

## 2015-03-17 LAB — COMPREHENSIVE METABOLIC PANEL
ALT: 20 U/L (ref 14–54)
AST: 19 U/L (ref 15–41)
Albumin: 4.6 g/dL (ref 3.5–5.0)
Alkaline Phosphatase: 158 U/L — ABNORMAL HIGH (ref 38–126)
Anion gap: 17 — ABNORMAL HIGH (ref 5–15)
BUN: 60 mg/dL — ABNORMAL HIGH (ref 6–20)
CHLORIDE: 106 mmol/L (ref 101–111)
CO2: 13 mmol/L — AB (ref 22–32)
CREATININE: 2.94 mg/dL — AB (ref 0.44–1.00)
Calcium: 9.1 mg/dL (ref 8.9–10.3)
GFR calc non Af Amer: 18 mL/min — ABNORMAL LOW (ref >60)
GFR, EST AFRICAN AMERICAN: 20 mL/min — AB (ref >60)
Glucose, Bld: 575 mg/dL (ref 65–99)
Potassium: 3.7 mmol/L (ref 3.5–5.1)
SODIUM: 136 mmol/L (ref 135–145)
Total Bilirubin: 1.1 mg/dL (ref 0.3–1.2)
Total Protein: 8.6 g/dL — ABNORMAL HIGH (ref 6.5–8.1)

## 2015-03-17 LAB — ACETAMINOPHEN LEVEL: Acetaminophen (Tylenol), Serum: <10 ug/mL — ABNORMAL LOW (ref 10–30)

## 2015-03-17 LAB — POC URINE PREG, ED: Preg Test, Ur: NEGATIVE

## 2015-03-17 LAB — SALICYLATE LEVEL

## 2015-03-17 LAB — I-STAT CG4 LACTIC ACID, ED: Lactic Acid, Venous: 3.54 mmol/L (ref 0.5–2.0)

## 2015-03-17 LAB — ETHANOL: Alcohol, Ethyl (B): <5 mg/dL (ref <5)

## 2015-03-17 MED ORDER — SODIUM CHLORIDE 0.9 % IV SOLN
1000.0000 mL | INTRAVENOUS | Status: DC
  Administered 2015-03-18 (×2): 1000 mL via INTRAVENOUS

## 2015-03-17 MED ORDER — SODIUM CHLORIDE 0.9 % IV SOLN
1000.0000 mL | Freq: Once | INTRAVENOUS | Status: AC
  Administered 2015-03-18: 1000 mL via INTRAVENOUS

## 2015-03-17 MED ORDER — DEXTROSE-NACL 5-0.45 % IV SOLN: INTRAVENOUS | Status: DC

## 2015-03-17 MED ORDER — SODIUM CHLORIDE 0.9 % IV SOLN
1000.0000 mL | Freq: Once | INTRAVENOUS | Status: AC
Start: 2015-03-17 — End: 2015-03-18
  Administered 2015-03-18: 1000 mL via INTRAVENOUS

## 2015-03-17 MED ORDER — SODIUM CHLORIDE 0.9 % IV BOLUS (SEPSIS)
1000.0000 mL | Freq: Once | INTRAVENOUS | Status: AC
  Administered 2015-03-17: 1000 mL via INTRAVENOUS

## 2015-03-17 MED ORDER — SODIUM CHLORIDE 0.9 % IV SOLN
INTRAVENOUS | Status: DC
  Administered 2015-03-18: 5.4 [IU]/h via INTRAVENOUS
  Filled 2015-03-17: qty 2.5

## 2015-03-17 MED ORDER — POTASSIUM CHLORIDE 10 MEQ/100ML IV SOLN
10.0000 meq | Freq: Once | INTRAVENOUS | Status: AC
  Administered 2015-03-18: 10 meq via INTRAVENOUS
  Filled 2015-03-17: qty 100
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
## 2015-03-17 NOTE — ED Notes (Signed)
[employer], spoke to [place]. Poison Control suggest monitoring pt's CBG every hour and monitoring pt for respiratory depression and hypotension.

## 2015-03-17 NOTE — ED Notes (Signed)
Bed: RESA Expected date:  Expected time:  Means of arrival:  Comments: EMS/9F/hyperglycemia/?OD

## 2015-03-17 NOTE — ED Notes (Signed)
Per EMS, pt took 200 units novolog, 10mg  morphine, 2 valium tablets, and an unknown quantity of klonopin this evening. EMS was called by husband out for elevated blood sugar. Pt's CBG 523. Pt hard to arouse upon arrival to ED. EMS is unsure if pt was trying to harm herself.

## 2015-03-18 ENCOUNTER — Inpatient Hospital Stay (HOSPITAL_COMMUNITY): Payer: PPO

## 2015-03-18 DIAGNOSIS — E785 Hyperlipidemia, unspecified: Secondary | ICD-10-CM | POA: Diagnosis present

## 2015-03-18 DIAGNOSIS — Z9119 Patient's noncompliance with other medical treatment and regimen: Secondary | ICD-10-CM | POA: Diagnosis present

## 2015-03-18 DIAGNOSIS — Z6838 Body mass index (BMI) 38.0-38.9, adult: Secondary | ICD-10-CM | POA: Diagnosis not present

## 2015-03-18 DIAGNOSIS — M549 Dorsalgia, unspecified: Secondary | ICD-10-CM | POA: Diagnosis present

## 2015-03-18 DIAGNOSIS — Z79899 Other long term (current) drug therapy: Secondary | ICD-10-CM | POA: Diagnosis not present

## 2015-03-18 DIAGNOSIS — E1011 Type 1 diabetes mellitus with ketoacidosis with coma: Secondary | ICD-10-CM | POA: Diagnosis not present

## 2015-03-18 DIAGNOSIS — F419 Anxiety disorder, unspecified: Secondary | ICD-10-CM | POA: Diagnosis present

## 2015-03-18 DIAGNOSIS — T383X6A Underdosing of insulin and oral hypoglycemic [antidiabetic] drugs, initial encounter: Secondary | ICD-10-CM | POA: Diagnosis present

## 2015-03-18 DIAGNOSIS — D509 Iron deficiency anemia, unspecified: Secondary | ICD-10-CM | POA: Diagnosis present

## 2015-03-18 DIAGNOSIS — R0682 Tachypnea, not elsewhere classified: Secondary | ICD-10-CM | POA: Diagnosis present

## 2015-03-18 DIAGNOSIS — Z9641 Presence of insulin pump (external) (internal): Secondary | ICD-10-CM | POA: Diagnosis present

## 2015-03-18 DIAGNOSIS — E876 Hypokalemia: Secondary | ICD-10-CM | POA: Diagnosis present

## 2015-03-18 DIAGNOSIS — E669 Obesity, unspecified: Secondary | ICD-10-CM | POA: Diagnosis present

## 2015-03-18 DIAGNOSIS — Y92009 Unspecified place in unspecified non-institutional (private) residence as the place of occurrence of the external cause: Secondary | ICD-10-CM | POA: Diagnosis not present

## 2015-03-18 DIAGNOSIS — E1310 Other specified diabetes mellitus with ketoacidosis without coma: Secondary | ICD-10-CM | POA: Diagnosis not present

## 2015-03-18 DIAGNOSIS — N179 Acute kidney failure, unspecified: Secondary | ICD-10-CM | POA: Diagnosis present

## 2015-03-18 DIAGNOSIS — I10 Essential (primary) hypertension: Secondary | ICD-10-CM | POA: Diagnosis present

## 2015-03-18 DIAGNOSIS — R4182 Altered mental status, unspecified: Secondary | ICD-10-CM | POA: Diagnosis not present

## 2015-03-18 DIAGNOSIS — Z22322 Carrier or suspected carrier of Methicillin resistant Staphylococcus aureus: Secondary | ICD-10-CM | POA: Diagnosis not present

## 2015-03-18 DIAGNOSIS — Z833 Family history of diabetes mellitus: Secondary | ICD-10-CM | POA: Diagnosis not present

## 2015-03-18 DIAGNOSIS — Z809 Family history of malignant neoplasm, unspecified: Secondary | ICD-10-CM | POA: Diagnosis not present

## 2015-03-18 DIAGNOSIS — Z794 Long term (current) use of insulin: Secondary | ICD-10-CM | POA: Diagnosis not present

## 2015-03-18 DIAGNOSIS — D72829 Elevated white blood cell count, unspecified: Secondary | ICD-10-CM | POA: Diagnosis present

## 2015-03-18 DIAGNOSIS — G9341 Metabolic encephalopathy: Secondary | ICD-10-CM | POA: Diagnosis present

## 2015-03-18 LAB — BASIC METABOLIC PANEL
ANION GAP: 12 (ref 5–15)
ANION GAP: 9 (ref 5–15)
Anion gap: 8 (ref 5–15)
BUN: 39 mg/dL — ABNORMAL HIGH (ref 6–20)
BUN: 29 mg/dL — AB (ref 6–20)
BUN: 17 mg/dL (ref 6–20)
CALCIUM: 7.7 mg/dL — AB (ref 8.9–10.3)
CHLORIDE: 113 mmol/L — AB (ref 101–111)
CO2: 14 mmol/L — ABNORMAL LOW (ref 22–32)
CO2: 15 mmol/L — AB (ref 22–32)
CO2: 18 mmol/L — AB (ref 22–32)
CREATININE: 1.16 mg/dL — AB (ref 0.44–1.00)
CREATININE: 0.86 mg/dL (ref 0.44–1.00)
Calcium: 7.4 mg/dL — ABNORMAL LOW (ref 8.9–10.3)
Calcium: 7.6 mg/dL — ABNORMAL LOW (ref 8.9–10.3)
Chloride: 114 mmol/L — ABNORMAL HIGH (ref 101–111)
Chloride: 116 mmol/L — ABNORMAL HIGH (ref 101–111)
Creatinine, Ser: 1.56 mg/dL — ABNORMAL HIGH (ref 0.44–1.00)
GFR calc Af Amer: >60 mL/min (ref >60)
GFR calc non Af Amer: 54 mL/min — ABNORMAL LOW (ref >60)
GFR calc non Af Amer: >60 mL/min (ref >60)
GFR, EST AFRICAN AMERICAN: 44 mL/min — AB (ref >60)
GFR, EST NON AFRICAN AMERICAN: 38 mL/min — AB (ref >60)
GLUCOSE: 191 mg/dL — AB (ref 65–99)
Glucose, Bld: 127 mg/dL — ABNORMAL HIGH (ref 65–99)
Glucose, Bld: 114 mg/dL — ABNORMAL HIGH (ref 65–99)
POTASSIUM: 3.9 mmol/L (ref 3.5–5.1)
POTASSIUM: 3.9 mmol/L (ref 3.5–5.1)
Potassium: 3.3 mmol/L — ABNORMAL LOW (ref 3.5–5.1)
SODIUM: 140 mmol/L (ref 135–145)
SODIUM: 140 mmol/L (ref 135–145)
Sodium: 139 mmol/L (ref 135–145)

## 2015-03-18 LAB — CBC
HEMATOCRIT: 37.5 % (ref 36.0–46.0)
Hemoglobin: 12.4 g/dL (ref 12.0–15.0)
MCH: 26.2 pg (ref 26.0–34.0)
MCHC: 33.1 g/dL (ref 30.0–36.0)
MCV: 79.3 fL (ref 78.0–100.0)
Platelets: 296 10*3/uL (ref 150–400)
RBC: 4.73 MIL/uL (ref 3.87–5.11)
RDW: 15.9 % — ABNORMAL HIGH (ref 11.5–15.5)
WBC: 23.2 10*3/uL — AB (ref 4.0–10.5)

## 2015-03-18 LAB — BLOOD GAS, ARTERIAL
Acid-base deficit: 13.7 mmol/L — ABNORMAL HIGH (ref 0.0–2.0)
BICARBONATE: 12.7 meq/L — AB (ref 20.0–24.0)
DRAWN BY: 41060
O2 Content: 2 L/min
O2 Saturation: 98 %
PCO2 ART: 32.4 mmHg — AB (ref 35.0–45.0)
PH ART: 7.219 — AB (ref 7.350–7.450)
Patient temperature: 98.6
TCO2: 12.1 mmol/L (ref 0–100)
pO2, Arterial: 125 mmHg — ABNORMAL HIGH (ref 80.0–100.0)

## 2015-03-18 LAB — URINALYSIS, ROUTINE W REFLEX MICROSCOPIC
Glucose, UA: >1000 mg/dL — AB
Ketones, ur: 15 mg/dL — AB
Leukocytes, UA: NEGATIVE
NITRITE: NEGATIVE
Protein, ur: 30 mg/dL — AB
SPECIFIC GRAVITY, URINE: 1.027 (ref 1.005–1.030)
UROBILINOGEN UA: 0.2 mg/dL (ref 0.0–1.0)
pH: 5 (ref 5.0–8.0)

## 2015-03-18 LAB — GLUCOSE, CAPILLARY
GLUCOSE-CAPILLARY: 271 mg/dL — AB (ref 65–99)
GLUCOSE-CAPILLARY: 196 mg/dL — AB (ref 65–99)
GLUCOSE-CAPILLARY: 141 mg/dL — AB (ref 65–99)
GLUCOSE-CAPILLARY: 125 mg/dL — AB (ref 65–99)
GLUCOSE-CAPILLARY: 114 mg/dL — AB (ref 65–99)
GLUCOSE-CAPILLARY: 124 mg/dL — AB (ref 65–99)
GLUCOSE-CAPILLARY: 172 mg/dL — AB (ref 65–99)
GLUCOSE-CAPILLARY: 119 mg/dL — AB (ref 65–99)
GLUCOSE-CAPILLARY: 96 mg/dL (ref 65–99)
Glucose-Capillary: 275 mg/dL — ABNORMAL HIGH (ref 65–99)
Glucose-Capillary: 186 mg/dL — ABNORMAL HIGH (ref 65–99)
Glucose-Capillary: 122 mg/dL — ABNORMAL HIGH (ref 65–99)
Glucose-Capillary: 90 mg/dL (ref 65–99)
Glucose-Capillary: 137 mg/dL — ABNORMAL HIGH (ref 65–99)

## 2015-03-18 LAB — URINE MICROSCOPIC-ADD ON

## 2015-03-18 LAB — RAPID URINE DRUG SCREEN, HOSP PERFORMED
AMPHETAMINES: NOT DETECTED
BARBITURATES: NOT DETECTED
BENZODIAZEPINES: POSITIVE — AB
Cocaine: NOT DETECTED
Opiates: POSITIVE — AB
TETRAHYDROCANNABINOL: NOT DETECTED

## 2015-03-18 LAB — PHOSPHORUS: Phosphorus: 1.1 mg/dL — ABNORMAL LOW (ref 2.5–4.6)

## 2015-03-18 LAB — PROCALCITONIN: PROCALCITONIN: 0.53 ng/mL

## 2015-03-18 LAB — LACTIC ACID, PLASMA
LACTIC ACID, VENOUS: 3.9 mmol/L — AB (ref 0.5–2.0)
LACTIC ACID, VENOUS: 2.9 mmol/L — AB (ref 0.5–2.0)
Lactic Acid, Venous: 3.3 mmol/L (ref 0.5–2.0)

## 2015-03-18 LAB — MRSA PCR SCREENING: MRSA BY PCR: POSITIVE — AB

## 2015-03-18 LAB — MAGNESIUM: Magnesium: 1.7 mg/dL (ref 1.7–2.4)

## 2015-03-18 MED ORDER — POTASSIUM CHLORIDE 10 MEQ/100ML IV SOLN
10.0000 meq | INTRAVENOUS | Status: AC
  Administered 2015-03-18 (×2): 10 meq via INTRAVENOUS
  Filled 2015-03-18: qty 100

## 2015-03-18 MED ORDER — VANCOMYCIN HCL 10 G IV SOLR
1250.0000 mg | INTRAVENOUS | Status: DC
  Administered 2015-03-18: 1250 mg via INTRAVENOUS
  Filled 2015-03-18: qty 1250

## 2015-03-18 MED ORDER — SODIUM PHOSPHATE 3 MMOLE/ML IV SOLN
10.0000 mmol | Freq: Once | INTRAVENOUS | Status: AC
  Administered 2015-03-18: 10 mmol via INTRAVENOUS
  Filled 2015-03-18: qty 3.33

## 2015-03-18 MED ORDER — NALOXONE HCL 1 MG/ML IJ SOLN
0.5000 mg/h | INTRAVENOUS | Status: DC
  Administered 2015-03-18: 0.25 mg/h via INTRAVENOUS
  Filled 2015-03-18: qty 4

## 2015-03-18 MED ORDER — INSULIN GLARGINE 100 UNIT/ML ~~LOC~~ SOLN
10.0000 [IU] | SUBCUTANEOUS | Status: DC
Start: 2015-03-18 — End: 2015-03-19
  Administered 2015-03-18: 10 [IU] via SUBCUTANEOUS
  Filled 2015-03-18 (×2): qty 0.1

## 2015-03-18 MED ORDER — PIPERACILLIN-TAZOBACTAM 3.375 G IVPB 30 MIN
3.3750 g | Freq: Once | INTRAVENOUS | Status: AC
  Administered 2015-03-18: 3.375 g via INTRAVENOUS
  Filled 2015-03-18: qty 50

## 2015-03-18 MED ORDER — INSULIN GLARGINE 100 UNIT/ML ~~LOC~~ SOLN: 10.0000 [IU] | Freq: Every day | SUBCUTANEOUS | Status: DC

## 2015-03-18 MED ORDER — CHLORHEXIDINE GLUCONATE CLOTH 2 % EX PADS
6.0000 | MEDICATED_PAD | Freq: Every day | CUTANEOUS | Status: DC
  Administered 2015-03-18 – 2015-03-20 (×3): 6 via TOPICAL

## 2015-03-18 MED ORDER — INSULIN ASPART 100 UNIT/ML ~~LOC~~ SOLN
0.0000 [IU] | SUBCUTANEOUS | Status: DC
  Administered 2015-03-18: 4 [IU] via SUBCUTANEOUS
  Administered 2015-03-18: 3 [IU] via SUBCUTANEOUS
  Administered 2015-03-19: 4 [IU] via SUBCUTANEOUS

## 2015-03-18 MED ORDER — MUPIROCIN 2 % EX OINT
1.0000 "application " | TOPICAL_OINTMENT | Freq: Two times a day (BID) | CUTANEOUS | Status: DC
  Administered 2015-03-18 – 2015-03-20 (×5): 1 via NASAL
  Filled 2015-03-18 (×4): qty 22

## 2015-03-18 MED ORDER — SODIUM CHLORIDE 0.45 % IV SOLN
INTRAVENOUS | Status: DC
  Administered 2015-03-18: 125 mL/h via INTRAVENOUS
  Administered 2015-03-18: 12:00:00 via INTRAVENOUS

## 2015-03-18 MED ORDER — SODIUM CHLORIDE 0.9 % IV BOLUS (SEPSIS)
1000.0000 mL | Freq: Once | INTRAVENOUS | Status: AC
  Administered 2015-03-18: 1000 mL via INTRAVENOUS

## 2015-03-18 MED ORDER — HEPARIN SODIUM (PORCINE) 5000 UNIT/ML IJ SOLN
5000.0000 [IU] | Freq: Three times a day (TID) | INTRAMUSCULAR | Status: DC
  Administered 2015-03-18 – 2015-03-19 (×4): 5000 [IU] via SUBCUTANEOUS
  Filled 2015-03-18 (×4): qty 1

## 2015-03-18 MED ORDER — MAGNESIUM SULFATE 2 GM/50ML IV SOLN
2.0000 g | Freq: Once | INTRAVENOUS | Status: AC
  Administered 2015-03-18: 2 g via INTRAVENOUS
  Filled 2015-03-18: qty 50

## 2015-03-18 MED ORDER — SODIUM CHLORIDE 0.9 % IV BOLUS (SEPSIS)
500.0000 mL | Freq: Once | INTRAVENOUS | Status: AC
  Administered 2015-03-18: 500 mL via INTRAVENOUS

## 2015-03-18 MED ORDER — PIPERACILLIN-TAZOBACTAM 3.375 G IVPB
3.3750 g | Freq: Three times a day (TID) | INTRAVENOUS | Status: DC
  Administered 2015-03-18 – 2015-03-19 (×3): 3.375 g via INTRAVENOUS
  Filled 2015-03-18 (×3): qty 50

## 2015-03-18 MED ORDER — DEXTROSE-NACL 5-0.45 % IV SOLN
INTRAVENOUS | Status: DC
  Administered 2015-03-18: 04:00:00 via INTRAVENOUS

## 2015-03-18 MED ORDER — DEXTROSE 5 % IV SOLN
40.0000 meq | Freq: Once | INTRAVENOUS | Status: AC
  Administered 2015-03-18: 40 meq via INTRAVENOUS
  Filled 2015-03-18: qty 9.09

## 2015-03-18 MED ORDER — VANCOMYCIN HCL 10 G IV SOLR
1500.0000 mg | Freq: Once | INTRAVENOUS | Status: AC
  Administered 2015-03-18: 1500 mg via INTRAVENOUS
  Filled 2015-03-18: qty 1500

## 2015-03-18 MED ORDER — SODIUM CHLORIDE 0.9 % IV SOLN: INTRAVENOUS | Status: DC

## 2015-03-18 NOTE — Progress Notes (Addendum)
[place] Progress Note Patient Name: Kristie [name] DOB: 09-Jul-1966 MRN: [id number]   Date of Service  03/18/2015  HPI/Events of Note  Lactate on last lab increased to 3.9. Hypophos. Low normal Mg and K  eICU Interventions  Bolus 1 more lt of NS. Pt already got 5 lt so far.  Recheck lactate after fluids bolus. Electrolytes repleted   Intervention Category Major Interventions: Other:  Georgina Krist 03/18/2015, 5:57 AM

## 2015-03-18 NOTE — Care Management Note (Signed)
Case Management Note  Patient Details  Name: Kristie [name] MRN: [id number] Date of Birth: 1966-01-12  Subjective/Objective:      dka and poss overdose              Action/Plan:Date:  Sept.2, 2016 U.R. performed for needs and level of care. Will continue to follow for Case Management needs.  Velva Harman, RN, BSN, [place]   [phone]   Expected Discharge Date:   (unknown)               Expected Discharge Plan:  Home/Self Care  In-House Referral:  Clinical Social Work  Discharge planning Services  CM Consult  Post Acute Care Choice:  NA Choice offered to:  NA  DME Arranged:    DME Agency:     HH Arranged:    [place] Agency:     Status of Service:  Completed, signed off  Medicare Important Message Given:    Date Medicare IM Given:    Medicare IM give by:    Date Additional Medicare IM Given:    Additional Medicare Important Message give by:     If discussed at [place] of Stay Meetings, dates discussed:    Additional Comments:  Leeroy Cha, RN 03/18/2015, 10:12 AM
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
## 2015-03-18 NOTE — Progress Notes (Addendum)
CRITICAL VALUE ALERT  Critical value received:  Lactic Acid 2.9  Date of notification:  03/18/2015  Time of notification:  9038  Critical value read back:Yes.    Nurse who received alert:  Jeannie Fend RN  MD notified (1st page):  Dr Elsworth Soho  Time of first page:  74   MD notified (2nd page):  Time of second page:  Responding MD:  Dr Elsworth Soho   Time MD responded:  1100

## 2015-03-18 NOTE — Progress Notes (Signed)
Inpatient Diabetes Program Recommendations  AACE/ADA: New Consensus Statement on Inpatient Glycemic Control (2013)  Target Ranges:  Prepandial:   less than 140 mg/dL      Peak postprandial:   less than 180 mg/dL (1-2 hours)      Critically ill patients:  140 - 180 mg/dL   Reason for Visit: DKA  Diabetes history: DM1 Hx GDM Outpatient Diabetes medications: Insulin Pump- see pump settings below Current orders for Inpatient glycemic control: Lantus 10 units Q24H, Novolog resistant Q31H  49 year old female with history of uncontrolled DM presented to [facility] ED with AMS. Initial glucose 550 with positive ketones in urine, and metabolic acidosis. GlucoStabilizer started per DKA Order set. Transitioned when AG 9 to Lantus 10 units Q24H. CO2 is 15 and lactic acid 2.9. Spoke with pt regarding her diabetes. Has been on insulin pump and states she "forgot all about it" and took it off. Appears somewhat confused and couldn't answer questions regarding monitoring or pump settings. Pump is with husband at home.  Do not feel patient is alert and ready to resume insulin pump. Appears to be very confused.  Inpatient Diabetes Program Recommendations Insulin - Basal: Increase Lantus to 30 units Q24H  Correction (SSI): Novolog resistant Q4H - when po intake increases please order tidwc and hs Insulin - Meal Coverage: Novolog 4 units tidwc for meal coverage insulin when pt eats > 50% meal HgbA1C: 12.0% - uncontrolled Outpatient Referral: Has been to [facility] on 01/18/15  Note: Concern of confusion and odd behavior such as trying to answer phone and phone was not ringing, voice was quiet and then suddenly got loud, then quiet again. Seems agitated and anxious. Discussed above with RN. Hospitalist to resume care per RN.  Thank you. Lorenda Peck, RD, LDN, CDE Inpatient Diabetes Coordinator [phone]

## 2015-03-18 NOTE — Progress Notes (Addendum)
CRITICAL VALUE ALERT  Critical value received:  Lactic Acid 3.3  Date of notification:  03/18/2015  Time of notification:  0750  Critical value read back:Yes.    Nurse who received alert:  Jeannie Fend RN  MD notified (1st page):  Dr. Elsworth Soho  Time of first page:  0840  MD notified (2nd page):  Time of second page:  Responding MD:  Dr. Elsworth Soho.     Time MD responded:  Informed Verbal in pt room at [place]

## 2015-03-18 NOTE — Progress Notes (Signed)
CRITICAL VALUE ALERT  Critical value received:  Lactic Acid 3.9  Date of notification:  03/18/15  Time of notification:  5072  Critical value read back:Yes.    Nurse who received alert:  Irene Pap   MD notified (1st page):  Georgann Housekeeper, NP  Time of first page:  [phone]  MD notified (2nd page):  Time of second page:  Responding MD: Georgann Housekeeper, NP  Time MD responded:  [phone]

## 2015-03-19 ENCOUNTER — Encounter (HOSPITAL_COMMUNITY): Payer: Self-pay | Admitting: Internal Medicine

## 2015-03-19 DIAGNOSIS — D509 Iron deficiency anemia, unspecified: Secondary | ICD-10-CM

## 2015-03-19 DIAGNOSIS — E872 Acidosis, unspecified: Secondary | ICD-10-CM | POA: Diagnosis present

## 2015-03-19 DIAGNOSIS — E876 Hypokalemia: Secondary | ICD-10-CM

## 2015-03-19 DIAGNOSIS — G9341 Metabolic encephalopathy: Secondary | ICD-10-CM

## 2015-03-19 DIAGNOSIS — Z22322 Carrier or suspected carrier of Methicillin resistant Staphylococcus aureus: Secondary | ICD-10-CM

## 2015-03-19 DIAGNOSIS — D72829 Elevated white blood cell count, unspecified: Secondary | ICD-10-CM

## 2015-03-19 DIAGNOSIS — E1065 Type 1 diabetes mellitus with hyperglycemia: Secondary | ICD-10-CM

## 2015-03-19 DIAGNOSIS — E1011 Type 1 diabetes mellitus with ketoacidosis with coma: Secondary | ICD-10-CM

## 2015-03-19 HISTORY — DX: Carrier or suspected carrier of methicillin resistant Staphylococcus aureus: Z22.322

## 2015-03-19 LAB — CBC
HCT: 31.7 % — ABNORMAL LOW (ref 36.0–46.0)
Hemoglobin: 10.6 g/dL — ABNORMAL LOW (ref 12.0–15.0)
MCH: 25.6 pg — ABNORMAL LOW (ref 26.0–34.0)
MCHC: 33.4 g/dL (ref 30.0–36.0)
MCV: 76.6 fL — AB (ref 78.0–100.0)
PLATELETS: 244 10*3/uL (ref 150–400)
RBC: 4.14 MIL/uL (ref 3.87–5.11)
RDW: 16.3 % — AB (ref 11.5–15.5)
WBC: 13.8 10*3/uL — AB (ref 4.0–10.5)

## 2015-03-19 LAB — BASIC METABOLIC PANEL
ANION GAP: 12 (ref 5–15)
Anion gap: 9 (ref 5–15)
Anion gap: 6 (ref 5–15)
BUN: 15 mg/dL (ref 6–20)
BUN: 11 mg/dL (ref 6–20)
BUN: 10 mg/dL (ref 6–20)
CHLORIDE: 115 mmol/L — AB (ref 101–111)
CHLORIDE: 112 mmol/L — AB (ref 101–111)
CHLORIDE: 112 mmol/L — AB (ref 101–111)
CO2: 15 mmol/L — ABNORMAL LOW (ref 22–32)
CO2: 18 mmol/L — ABNORMAL LOW (ref 22–32)
CO2: 19 mmol/L — AB (ref 22–32)
CREATININE: 0.9 mg/dL (ref 0.44–1.00)
CREATININE: 0.79 mg/dL (ref 0.44–1.00)
Calcium: 7.5 mg/dL — ABNORMAL LOW (ref 8.9–10.3)
Calcium: 7.4 mg/dL — ABNORMAL LOW (ref 8.9–10.3)
Calcium: 7.4 mg/dL — ABNORMAL LOW (ref 8.9–10.3)
Creatinine, Ser: 0.93 mg/dL (ref 0.44–1.00)
GFR calc Af Amer: >60 mL/min (ref >60)
GFR calc Af Amer: >60 mL/min (ref >60)
GFR calc non Af Amer: >60 mL/min (ref >60)
GFR calc non Af Amer: >60 mL/min (ref >60)
GLUCOSE: 98 mg/dL (ref 65–99)
GLUCOSE: 252 mg/dL — AB (ref 65–99)
Glucose, Bld: 100 mg/dL — ABNORMAL HIGH (ref 65–99)
POTASSIUM: 3.3 mmol/L — AB (ref 3.5–5.1)
Potassium: 2.9 mmol/L — ABNORMAL LOW (ref 3.5–5.1)
Potassium: 3.5 mmol/L (ref 3.5–5.1)
SODIUM: 142 mmol/L (ref 135–145)
SODIUM: 139 mmol/L (ref 135–145)
Sodium: 137 mmol/L (ref 135–145)

## 2015-03-19 LAB — PHOSPHORUS: Phosphorus: 1.2 mg/dL — ABNORMAL LOW (ref 2.5–4.6)

## 2015-03-19 LAB — GLUCOSE, CAPILLARY
GLUCOSE-CAPILLARY: 114 mg/dL — AB (ref 65–99)
GLUCOSE-CAPILLARY: 279 mg/dL — AB (ref 65–99)
Glucose-Capillary: 118 mg/dL — ABNORMAL HIGH (ref 65–99)
Glucose-Capillary: 164 mg/dL — ABNORMAL HIGH (ref 65–99)
Glucose-Capillary: 225 mg/dL — ABNORMAL HIGH (ref 65–99)
Glucose-Capillary: 146 mg/dL — ABNORMAL HIGH (ref 65–99)

## 2015-03-19 LAB — URINE CULTURE: CULTURE: NO GROWTH

## 2015-03-19 LAB — PROCALCITONIN: PROCALCITONIN: 0.22 ng/mL

## 2015-03-19 LAB — MAGNESIUM: Magnesium: 2 mg/dL (ref 1.7–2.4)

## 2015-03-19 MED ORDER — CLONAZEPAM 1 MG PO TABS
2.0000 mg | ORAL_TABLET | Freq: Every day | ORAL | Status: DC
  Administered 2015-03-19: 2 mg via ORAL
  Filled 2015-03-19: qty 2

## 2015-03-19 MED ORDER — VANCOMYCIN HCL IN DEXTROSE 750-5 MG/150ML-% IV SOLN
750.0000 mg | Freq: Two times a day (BID) | INTRAVENOUS | Status: DC
  Administered 2015-03-19: 750 mg via INTRAVENOUS
  Filled 2015-03-19: qty 150

## 2015-03-19 MED ORDER — INSULIN ASPART 100 UNIT/ML ~~LOC~~ SOLN
0.0000 [IU] | Freq: Three times a day (TID) | SUBCUTANEOUS | Status: DC
  Administered 2015-03-19: 5 [IU] via SUBCUTANEOUS
  Administered 2015-03-19: 8 [IU] via SUBCUTANEOUS
  Administered 2015-03-20: 3 [IU] via SUBCUTANEOUS
  Administered 2015-03-20: 11 [IU] via SUBCUTANEOUS

## 2015-03-19 MED ORDER — INSULIN ASPART 100 UNIT/ML ~~LOC~~ SOLN: 0.0000 [IU] | Freq: Every day | SUBCUTANEOUS | Status: DC

## 2015-03-19 MED ORDER — ENOXAPARIN SODIUM 40 MG/0.4ML ~~LOC~~ SOLN
40.0000 mg | SUBCUTANEOUS | Status: DC
  Administered 2015-03-19 – 2015-03-20 (×2): 40 mg via SUBCUTANEOUS
  Filled 2015-03-19 (×2): qty 0.4

## 2015-03-19 MED ORDER — INSULIN GLARGINE 100 UNIT/ML ~~LOC~~ SOLN
30.0000 [IU] | SUBCUTANEOUS | Status: DC
  Administered 2015-03-19 – 2015-03-20 (×2): 30 [IU] via SUBCUTANEOUS
  Filled 2015-03-19 (×2): qty 0.3

## 2015-03-19 MED ORDER — VENLAFAXINE HCL ER 150 MG PO CP24
300.0000 mg | ORAL_CAPSULE | Freq: Every day | ORAL | Status: DC
  Administered 2015-03-19 – 2015-03-20 (×2): 300 mg via ORAL
  Filled 2015-03-19 (×4): qty 2

## 2015-03-19 MED ORDER — POTASSIUM CHLORIDE IN NACL 40-0.9 MEQ/L-% IV SOLN
INTRAVENOUS | Status: DC
  Administered 2015-03-19 – 2015-03-20 (×2): 100 mL/h via INTRAVENOUS
  Filled 2015-03-19 (×6): qty 1000

## 2015-03-19 MED ORDER — POTASSIUM PHOSPHATES 15 MMOLE/5ML IV SOLN
30.0000 mmol | Freq: Once | INTRAVENOUS | Status: AC
  Administered 2015-03-19: 30 mmol via INTRAVENOUS
  Filled 2015-03-19: qty 10

## 2015-03-19 MED ORDER — INSULIN ASPART 100 UNIT/ML ~~LOC~~ SOLN
4.0000 [IU] | Freq: Three times a day (TID) | SUBCUTANEOUS | Status: DC
  Administered 2015-03-19 (×2): 4 [IU] via SUBCUTANEOUS

## 2015-03-19 MED ORDER — POTASSIUM CHLORIDE CRYS ER 20 MEQ PO TBCR
40.0000 meq | EXTENDED_RELEASE_TABLET | Freq: Once | ORAL | Status: AC
  Administered 2015-03-19: 40 meq via ORAL
  Filled 2015-03-19: qty 2

## 2015-03-19 MED ORDER — CLONAZEPAM 1 MG PO TABS
1.0000 mg | ORAL_TABLET | ORAL | Status: DC
  Administered 2015-03-20 (×2): 1 mg via ORAL
  Filled 2015-03-19 (×2): qty 1

## 2015-03-19 NOTE — Progress Notes (Signed)
Pt resting comfortably. Denies pain. Drowsy, answers questions appropriately then falls back to sleep. VSS. Potassium= 2.9 called to Provider, Dr.Ramaswamy.

## 2015-03-19 NOTE — Progress Notes (Signed)
Progress Note   Kristie [name] FFM:[id number] DOB: Apr 04, 1966 DOA: 03/17/2015 PCP: Tammi Sou, MD   Brief Narrative:   Kristie [name] is an 49 y.o. female with a PMH of uncontrolled diabetes who was admitted 03/17/15 with altered mental status in the setting of DKA.  Assessment/Plan:   Principal Problem:   DKA (diabetic ketoacidoses) with metabolic encephalopathy in the setting of uncontrolled type 1 diabetes - Initially placed on an insulin drip, now on insulin resistant SSI every 4 hours and Lantus 10 units daily. - CBGs 96-172. Hemoglobin A1c 12%, uncontrolled. - Evaluated by diabetic coordinator 03/18/15. Recommendations noted. - Increase Lantus to 30 units daily, change SSI to moderate scale Q AC/HS & add 4 units of meal coverage. - Transfer to floor.  Active Problems:   Essential hypertension - Blood pressure controlled, not currently on medication.    Hypokalemia - Repleting.    Hypophosphatemia - Repleting.    Leukocytosis with lactic acidosis - Follow-up blood and urine cultures. - Placed on empiric vancomycin and Zosyn on admission given recent hospitalization. Discontinue. - Pro calcitonin reassuring at 0.22. Chest x-ray clear.    Anemia, iron deficiency - On iron supplementation at home.    MRSA carrier - Decontamination therapy ordered.    DVT Prophylaxis - Lovenox ordered.  Family Communication: No family at the bedside.  Disposition Plan: Home when blood glucoses stable, likely 03/20/15. Lives with husband. Code Status:     Code Status Orders        Start     Ordered   03/18/15 0032  Full code   Continuous     03/18/15 0035        IV Access:    Peripheral IV   Procedures and diagnostic studies:   Ct Head Wo Contrast  03/18/2015   CLINICAL DATA:  Acute onset of hyperglycemia. Overdose on medication. Initial encounter.  EXAM: CT HEAD WITHOUT CONTRAST  TECHNIQUE: Contiguous axial images were obtained from the base of the skull  through the vertex without intravenous contrast.  COMPARISON:  MRI of the brain performed 09/16/2006  FINDINGS: There is no evidence of acute infarction, mass lesion, or intra- or extra-axial hemorrhage on CT.  Mild periventricular white matter change may reflect small vessel ischemic microangiopathy.  The posterior fossa, including the cerebellum, brainstem and fourth ventricle, is within normal limits. The third and lateral ventricles, and basal ganglia are unremarkable in appearance. The cerebral hemispheres are symmetric in appearance, with normal gray-white differentiation. No mass effect or midline shift is seen.  There is no evidence of fracture; visualized osseous structures are unremarkable in appearance. The orbits are within normal limits. The paranasal sinuses and mastoid air cells are well-aerated. No significant soft tissue abnormalities are seen.  IMPRESSION: 1. No acute intracranial pathology seen on CT. 2. Mild small vessel ischemic microangiopathy.   Electronically Signed   By: Garald Balding M.D.   On: 03/18/2015 00:16   Portable Chest X-ray (1 View)  03/18/2015   CLINICAL DATA:  Overdose.  Dyspnea.  EXAM: PORTABLE CHEST - 1 VIEW  COMPARISON:  08/02/2013  FINDINGS: There is a shallow inspiration with associated crowding of the basilar markings. There is no confluent airspace opacity. There is no large effusion. There is no pneumothorax. Heart size is normal.  IMPRESSION: Shallow inspiration.   Electronically Signed   By: Andreas Newport M.D.   On: 03/18/2015 01:00     Medical Consultants:    None.  Anti-Infectives:  Vancomycin 03/18/15--->  Zosyn 03/18/15--->  Subjective:   [name] denies abdominal pain, nausea or vomiting.  Ate breakfast this morning.  No diarrhea.  No dyspnea or cough.  Objective:    Filed Vitals:   03/18/15 2300 03/19/15 0000 03/19/15 0100 03/19/15 0200  BP: 121/61 91/61 131/69 94/65  Pulse: 95 90 95 86  Temp: 99.1 F (37.3 C) 99 F (37.2  C) 99.1 F (37.3 C) 99 F (37.2 C)  TempSrc:      Resp: 17 17 17 17   Weight:      SpO2: 99% 100% 98% 99%    Intake/Output Summary (Last 24 hours) at 03/19/15 0721 Last data filed at 03/18/15 2200  Gross per 24 hour  Intake 2094.98 ml  Output   1480 ml  Net 614.98 ml    Exam: Gen:  NAD Cardiovascular:  Mildly tachycardic, No M/R/G Respiratory:  Lungs CTAB Gastrointestinal:  Abdomen soft, mildly tender to deep palpation, + BS Extremities:  No C/E/C   Data Reviewed:    Labs: Basic Metabolic Panel:  Recent Labs Lab 03/18/15 0226 03/18/15 0440 03/18/15 0950 03/18/15 1825 03/18/15 2215 03/19/15 0235  NA  --  140 140 139 142 139  K  --  3.9 3.9 3.3* 3.3* 2.9*  CL  --  114* 116* 113* 115* 112*  CO2  --  14* 15* 18* 15* 18*  GLUCOSE  --  191* 127* 114* 100* 98  BUN  --  39* 29* 17 15 11   CREATININE  --  1.56* 1.16* 0.86 0.93 0.90  CALCIUM  --  7.7* 7.4* 7.6* 7.5* 7.4*  MG 1.7  --   --   --   --  2.0  PHOS 1.1*  --   --   --   --  1.2*   GFR Estimated Creatinine Clearance: 94.3 mL/min (by C-G formula based on Cr of 0.9). Liver Function Tests:  Recent Labs Lab 03/17/15 2234  AST 19  ALT 20  ALKPHOS 158*  BILITOT 1.1  PROT 8.6*  ALBUMIN 4.6   CBC:  Recent Labs Lab 03/17/15 2234 03/18/15 0550 03/19/15 0235  WBC 29.1* 23.2* 13.8*  NEUTROABS 25.6*  --   --   HGB 15.2* 12.4 10.6*  HCT 46.1* 37.5 31.7*  MCV 79.2 79.3 76.6*  PLT 390 296 244   CBG:  Recent Labs Lab 03/18/15 1541 03/18/15 1815 03/18/15 1933 03/18/15 2356 03/19/15 0603  GLUCAP 172* 119* 96 114* 118*   Hgb A1c:  Ref. Range 12/20/2014 12:15  Hemoglobin A1C Latest Ref Range: 4.6-6.5 % 12.0 (H)    Sepsis Labs:  Recent Labs Lab 03/17/15 2234 03/17/15 2309 03/18/15 0226 03/18/15 0550 03/18/15 0900 03/19/15 0235  PROCALCITON  --   --  0.53  --   --  0.22  WBC 29.1*  --   --  23.2*  --  13.8*  LATICACIDVEN  --  3.54* 3.9* 3.3* 2.9*  --    Microbiology Recent Results (from  the past 240 hour(s))  MRSA PCR Screening     Status: Abnormal   Collection Time: 03/18/15  1:15 AM  Result Value Ref Range Status   MRSA by PCR POSITIVE (A) NEGATIVE Final    Comment:        The GeneXpert MRSA Assay (FDA approved for NASAL specimens only), is one component of a comprehensive MRSA colonization surveillance program. It is not intended to diagnose MRSA infection nor to guide or monitor treatment for MRSA infections. RESULT CALLED  TO, READ BACK BY AND VERIFIED WITH: Bluford Kaufmann [id number] @ 0300 BY J SCOTTON   Culture, blood (routine x 2)     Status: None (Preliminary result)   Collection Time: 03/18/15  2:00 AM  Result Value Ref Range Status   Specimen Description BLOOD LEFT ARM  Final   Special Requests IN PEDIATRIC BOTTLE 4CC  Final   Culture PENDING  Incomplete   Report Status PENDING  Incomplete  Culture, blood (routine x 2)     Status: None (Preliminary result)   Collection Time: 03/18/15  2:26 AM  Result Value Ref Range Status   Specimen Description BLOOD RIGHT HAND  Final   Special Requests IN PEDIATRIC BOTTLE 2CC  Final   Culture PENDING  Incomplete   Report Status PENDING  Incomplete     Medications:   . Chlorhexidine Gluconate Cloth  6 each Topical Q0600  . heparin  5,000 Units Subcutaneous 3 times per day  . insulin aspart  0-20 Units Subcutaneous 6 times per day  . insulin glargine  10 Units Subcutaneous Q24H  . mupirocin ointment  1 application Nasal BID  . piperacillin-tazobactam (ZOSYN)  IV  3.375 g Intravenous 3 times per day  . potassium phosphate IVPB (mmol)  30 mmol Intravenous Once  . vancomycin  1,250 mg Intravenous Q24H   Continuous Infusions: . sodium chloride 125 mL/hr (03/18/15 2035)    Time spent: 35 minutes.  The patient is medically complex and requires high complexity decision making.    LOS: 1 day   RAMA,CHRISTINA  [facility] Pager [phone]. If unable to reach me by pager, please call my cell phone at  [phone].  *Please refer to [email or web address], password [email or web address] to get updated schedule on who will round on this patient, as hospitalists switch teams weekly. If 7PM-7AM, please contact night-coverage at [email or web address], password [email or web address] for any overnight needs.  03/19/2015, 7:21 AM

## 2015-03-19 NOTE — Progress Notes (Signed)
[facility] Progress Note Patient Name: Kristie [name] DOB: October 11, 1965 MRN: [id number]  [facility] Physician Progress Note and Electrolyte Replacement  Patient Name: Kristie [name] DOB: Aug 19, 1965 MRN: [id number]  Date of Service  03/19/2015   HPI/Events of Note    Recent Labs Lab 03/18/15 0226 03/18/15 0440 03/18/15 0950 03/18/15 1825 03/18/15 2215 03/19/15 0235  NA  --  140 140 139 142 139  K  --  3.9 3.9 3.3* 3.3* 2.9*  CL  --  114* 116* 113* 115* 112*  CO2  --  14* 15* 18* 15* 18*  GLUCOSE  --  191* 127* 114* 100* 98  BUN  --  39* 29* 17 15 11   CREATININE  --  1.56* 1.16* 0.86 0.93 0.90  CALCIUM  --  7.7* 7.4* 7.6* 7.5* 7.4*  MG 1.7  --   --   --   --  2.0  PHOS 1.1*  --   --   --   --  1.2*    Estimated Creatinine Clearance: 94.3 mL/min (by C-G formula based on Cr of 0.9).  Intake/Output      09/02 0701 - 09/03 0700   P.O. 90   I.V. (mL/kg) 892.6 (8.5)   IV Piggyback 1112.4   Total Intake(mL/kg) 2095 (20)   Urine (mL/kg/hr) 1480 (0.6)   Total Output 1480   Net +615        - I/O DETAILED x 24h    Total I/O In: 300 [IV Piggyback:300] Out: 180 [Urine:180] - I/O THIS SHIFT    ASSESSMENT Hypokalemia  Hypophosphatemia   eICURN Interventions  82meq po kcl IV k phos 58mmol   ASSESSMENT: MAJOR ELECTROLYTE      Dr. Brand Males, M.D., [facility].C.P Pulmonary and Critical Care Medicine Staff Physician [facility] Pager: [phone], If no answer or between  15:00h - 7:00h: call [phone]  03/19/2015 4:26 AM        Intervention Category Major Interventions: Electrolyte abnormality - evaluation and management  Milferd Ansell 03/19/2015, 4:26 AM

## 2015-03-19 NOTE — Progress Notes (Signed)
Foley catheter removed per MD order.  Patient tolerated procedure well. Will continue to monitor.

## 2015-03-19 NOTE — Progress Notes (Signed)
ANTIBIOTIC CONSULT NOTE - FOLLOW UP  Pharmacy Consult for vancomycin / zosyn Indication: sepsis  No Known Allergies  Patient Measurements: Weight: 231 lb 7.7 oz (105 kg) Adjusted Body Weight:   Vital Signs: Temp: 99 F (37.2 C) (09/03 0200) BP: 94/65 mmHg (09/03 0200) Pulse Rate: 86 (09/03 0200) Intake/Output from previous day: 09/02 0701 - 09/03 0700 In: 2095 [P.O.:90; I.V.:892.6; IV Piggyback:1112.4] Out: 1480 [Urine:1480] Intake/Output from this shift:    Labs:  Recent Labs  03/17/15 2234  03/18/15 0550  03/18/15 1825 03/18/15 2215 03/19/15 0235  WBC 29.1*  --  23.2*  --   --   --  13.8*  HGB 15.2*  --  12.4  --   --   --  10.6*  PLT 390  --  296  --   --   --  244  CREATININE 2.94*  < >  --   < > 0.86 0.93 0.90  < > = values in this interval not displayed. Estimated Creatinine Clearance: 94.3 mL/min (by C-G formula based on Cr of 0.9). No results for input(s): VANCOTROUGH, VANCOPEAK, VANCORANDOM, GENTTROUGH, GENTPEAK, GENTRANDOM, TOBRATROUGH, TOBRAPEAK, TOBRARND, AMIKACINPEAK, AMIKACINTROU, AMIKACIN in the last 72 hours.   Microbiology: Recent Results (from the past 720 hour(s))  MRSA PCR Screening     Status: Abnormal   Collection Time: 03/18/15  1:15 AM  Result Value Ref Range Status   MRSA by PCR POSITIVE (A) NEGATIVE Final    Comment:        The GeneXpert MRSA Assay (FDA approved for NASAL specimens only), is one component of a comprehensive MRSA colonization surveillance program. It is not intended to diagnose MRSA infection nor to guide or monitor treatment for MRSA infections. RESULT CALLED TO, READ BACK BY AND VERIFIED WITH: Bluford Kaufmann [id number] @ 0300 BY J SCOTTON   Culture, blood (routine x 2)     Status: None (Preliminary result)   Collection Time: 03/18/15  2:00 AM  Result Value Ref Range Status   Specimen Description BLOOD LEFT ARM  Final   Special Requests IN PEDIATRIC BOTTLE 4CC  Final   Culture PENDING  Incomplete   Report  Status PENDING  Incomplete  Culture, blood (routine x 2)     Status: None (Preliminary result)   Collection Time: 03/18/15  2:26 AM  Result Value Ref Range Status   Specimen Description BLOOD RIGHT HAND  Final   Special Requests IN PEDIATRIC BOTTLE 2CC  Final   Culture PENDING  Incomplete   Report Status PENDING  Incomplete    Anti-infectives    Start     Dose/Rate Route Frequency Ordered Stop   03/18/15 2200  vancomycin (VANCOCIN) 1,250 mg in sodium chloride 0.9 % 250 mL IVPB     1,250 mg 166.7 mL/hr over 90 Minutes Intravenous Every 24 hours 03/18/15 0145     03/18/15 0600  piperacillin-tazobactam (ZOSYN) IVPB 3.375 g     3.375 g 12.5 mL/hr over 240 Minutes Intravenous 3 times per day 03/18/15 0146     03/18/15 0130  vancomycin (VANCOCIN) 1,500 mg in sodium chloride 0.9 % 500 mL IVPB     1,500 mg 250 mL/hr over 120 Minutes Intravenous  Once 03/18/15 0126 03/18/15 0336   03/18/15 0130  piperacillin-tazobactam (ZOSYN) IVPB 3.375 g     3.375 g 100 mL/hr over 30 Minutes Intravenous  Once 03/18/15 0126 03/18/15 0351      Assessment: Kristie [name] with hx of poorly controlled DM, on insulin pump at home,  admitted with DKA after running out of insulin for pump. Started on IV antibiotics for sepsis.    Anti-infectives 9/2 >> zosyn >> 9/2 >> vanc >>  Vitals/Labs WBC: improving Tm24h:99.5 Renal: AKI resolving, CrCl now 94 (n85) Lactate trending down  Cultures 9/2 Bldx2: pending 9/2 Urine: collected 9/2 MRSA PCR: Positive  Goal of Therapy:  Vancomycin trough level 15-20 mcg/ml  Eradication of infection  Plan:  Measure antibiotic drug levels at steady state Continue Zosyn 3.375g IV q8h (infuse over 4 hours) Change to Vancomycin 750mg  IV q12h for improved renal function  Ralene Bathe, PharmD, BCPS 03/19/2015, 9:05 AM  Pager: [phone]

## 2015-03-20 DIAGNOSIS — I10 Essential (primary) hypertension: Secondary | ICD-10-CM

## 2015-03-20 LAB — BASIC METABOLIC PANEL
ANION GAP: 5 (ref 5–15)
BUN: 9 mg/dL (ref 6–20)
CALCIUM: 7.6 mg/dL — AB (ref 8.9–10.3)
CO2: 22 mmol/L (ref 22–32)
CREATININE: 0.65 mg/dL (ref 0.44–1.00)
Chloride: 111 mmol/L (ref 101–111)
GFR calc Af Amer: >60 mL/min (ref >60)
GLUCOSE: 192 mg/dL — AB (ref 65–99)
Potassium: 3.7 mmol/L (ref 3.5–5.1)
Sodium: 138 mmol/L (ref 135–145)

## 2015-03-20 LAB — PROCALCITONIN

## 2015-03-20 LAB — GLUCOSE, CAPILLARY
Glucose-Capillary: 185 mg/dL — ABNORMAL HIGH (ref 65–99)
Glucose-Capillary: 316 mg/dL — ABNORMAL HIGH (ref 65–99)

## 2015-03-20 LAB — PHOSPHORUS: Phosphorus: 2 mg/dL — ABNORMAL LOW (ref 2.5–4.6)

## 2015-03-20 MED ORDER — MUPIROCIN 2 % EX OINT: 1.0000 "application " | TOPICAL_OINTMENT | Freq: Two times a day (BID) | CUTANEOUS | Status: DC

## 2015-03-20 MED ORDER — INSULIN ASPART 100 UNIT/ML ~~LOC~~ SOLN: 0.0000 [IU] | Freq: Every day | SUBCUTANEOUS | Status: DC

## 2015-03-20 MED ORDER — INSULIN ASPART 100 UNIT/ML ~~LOC~~ SOLN: 5.0000 [IU] | Freq: Three times a day (TID) | SUBCUTANEOUS | Status: DC

## 2015-03-20 MED ORDER — INSULIN ASPART 100 UNIT/ML ~~LOC~~ SOLN: 0.0000 [IU] | Freq: Three times a day (TID) | SUBCUTANEOUS | Status: DC

## 2015-03-20 MED ORDER — INSULIN ASPART 100 UNIT/ML ~~LOC~~ SOLN
5.0000 [IU] | Freq: Three times a day (TID) | SUBCUTANEOUS | Status: DC
  Administered 2015-03-20 (×2): 5 [IU] via SUBCUTANEOUS

## 2015-03-20 MED ORDER — POTASSIUM PHOSPHATES 15 MMOLE/5ML IV SOLN
30.0000 mmol | Freq: Once | INTRAVENOUS | Status: AC
  Administered 2015-03-20: 30 mmol via INTRAVENOUS
  Filled 2015-03-20: qty 10

## 2015-03-20 NOTE — Discharge Summary (Signed)
Physician Discharge Summary  Kristie [name] MOQ:[id number] DOB: 1965/09/25 DOA: 03/17/2015  PCP: Tammi Sou, MD  Admit date: 03/17/2015 Discharge date: 03/20/2015   Recommendations for Outpatient Follow-Up:   1. Recommend close follow-up with endocrinologist for assessment of glycemic control and pump settings. 2. PCP: Please follow-up on final blood and urine cultures which were negative to date at the time of discharge.   Discharge Diagnosis:   Principal Problem:    DKA (diabetic ketoacidoses) Active Problems:    Essential hypertension    Type 1 diabetes mellitus, uncontrolled    Hypokalemia    Hypophosphatemia    Leukocytosis    Anemia, iron deficiency    Lactic acidosis    MRSA carrier    Encephalopathy, metabolic   Discharge disposition:  Home.    Discharge Condition: Improved.  Diet recommendation: Low sodium, heart healthy.  Carbohydrate-modified.    History of Present Illness:   Kristie [name] is an 49 y.o. female with a PMH of uncontrolled diabetes who was admitted 03/17/15 with altered mental status in the setting of DKA.   Hospital Course by Problem:   Principal Problem:  DKA (diabetic ketoacidoses) with metabolic encephalopathy in the setting of uncontrolled type 1 diabetes - Initially placed on an insulin drip, now on Lantus 30 units daily, moderate scale SSI with 5 units of meal coverage. - Hemoglobin A1c 12%, uncontrolled. - DKA trigger was likely noncompliance with managing her pump correctly. - Evaluated by diabetic coordinator with recommendations to discharge home on moderate scale SSI and 5 units of meal coverage with resumption of insulin pump in the morning. - Recommend close follow-up with endocrinologist to assess barriers to proper evaluation of pump and compliance with pump.  Active Problems:  Essential hypertension - Blood pressure controlled, not currently on medication.   Hypokalemia - Repleted.    Hypophosphatemia - Still low. Given further repletion prior to discharge.   Leukocytosis with lactic acidosis - Follow-up blood and urine cultures, negative to date. - Empiric vancomycin and Zosyn discontinued 03/19/15. No infectious source identified. - Pro calcitonin reassuring at 0.22. Chest x-ray clear.   Anemia, iron deficiency - On iron supplementation at home.   MRSA carrier - Decontamination therapy ordered.    Medical Consultants:    None.   Discharge Exam:   Filed Vitals:   03/20/15 1313  BP: 136/91  Pulse: 80  Temp: 97.8 F (36.6 C)  Resp: 18   Filed Vitals:   03/19/15 1425 03/19/15 2031 03/20/15 0442 03/20/15 1313  BP: 124/82 121/58 114/69 136/91  Pulse: 100 84 81 80  Temp: 98.4 F (36.9 C) 98.1 F (36.7 C) 98.5 F (36.9 C) 97.8 F (36.6 C)  TempSrc: Oral Oral Oral Oral  Resp: 18 18 18 18   Height: 5\' 7"  (1.702 m)     Weight: 110.406 kg (243 lb 6.4 oz)     SpO2: 98% 99% 98% 98%    Gen:  NAD Cardiovascular:  RRR, No M/R/G Respiratory: Lungs CTAB Gastrointestinal: Abdomen soft, NT/ND with normal active bowel sounds. Extremities: No C/E/C   The results of significant diagnostics from this hospitalization (including imaging, microbiology, ancillary and laboratory) are listed below for reference.     Procedures and Diagnostic Studies:   Ct Head Wo Contrast  03/18/2015   CLINICAL DATA:  Acute onset of hyperglycemia. Overdose on medication. Initial encounter.  EXAM: CT HEAD WITHOUT CONTRAST  TECHNIQUE: Contiguous axial images were obtained from the base of the skull through the vertex without intravenous  contrast.  COMPARISON:  MRI of the brain performed 09/16/2006  FINDINGS: There is no evidence of acute infarction, mass lesion, or intra- or extra-axial hemorrhage on CT.  Mild periventricular white matter change may reflect small vessel ischemic microangiopathy.  The posterior fossa, including the cerebellum, brainstem and fourth ventricle, is within  normal limits. The third and lateral ventricles, and basal ganglia are unremarkable in appearance. The cerebral hemispheres are symmetric in appearance, with normal gray-white differentiation. No mass effect or midline shift is seen.  There is no evidence of fracture; visualized osseous structures are unremarkable in appearance. The orbits are within normal limits. The paranasal sinuses and mastoid air cells are well-aerated. No significant soft tissue abnormalities are seen.  IMPRESSION: 1. No acute intracranial pathology seen on CT. 2. Mild small vessel ischemic microangiopathy.   Electronically Signed   By: Garald Balding M.D.   On: 03/18/2015 00:16   Portable Chest X-ray (1 View)  03/18/2015   CLINICAL DATA:  Overdose.  Dyspnea.  EXAM: PORTABLE CHEST - 1 VIEW  COMPARISON:  08/02/2013  FINDINGS: There is a shallow inspiration with associated crowding of the basilar markings. There is no confluent airspace opacity. There is no large effusion. There is no pneumothorax. Heart size is normal.  IMPRESSION: Shallow inspiration.   Electronically Signed   By: Andreas Newport M.D.   On: 03/18/2015 01:00     Labs:   Basic Metabolic Panel:  Recent Labs Lab 03/18/15 0226  03/18/15 1825 03/18/15 2215 03/19/15 0235 03/19/15 1209 03/20/15 0422  NA  --   < > 139 142 139 137 138  K  --   < > 3.3* 3.3* 2.9* 3.5 3.7  CL  --   < > 113* 115* 112* 112* 111  CO2  --   < > 18* 15* 18* 19* 22  GLUCOSE  --   < > 114* 100* 98 252* 192*  BUN  --   < > 17 15 11 10 9   CREATININE  --   < > 0.86 0.93 0.90 0.79 0.65  CALCIUM  --   < > 7.6* 7.5* 7.4* 7.4* 7.6*  MG 1.7  --   --   --  2.0  --   --   PHOS 1.1*  --   --   --  1.2*  --  2.0*  < > = values in this interval not displayed. GFR Estimated Creatinine Clearance: 108.9 mL/min (by C-G formula based on Cr of 0.65). Liver Function Tests:  Recent Labs Lab 03/17/15 2234  AST 19  ALT 20  ALKPHOS 158*  BILITOT 1.1  PROT 8.6*  ALBUMIN 4.6   CBC:  Recent  Labs Lab 03/17/15 2234 03/18/15 0550 03/19/15 0235  WBC 29.1* 23.2* 13.8*  NEUTROABS 25.6*  --   --   HGB 15.2* 12.4 10.6*  HCT 46.1* 37.5 31.7*  MCV 79.2 79.3 76.6*  PLT 390 296 244   CBG:  Recent Labs Lab 03/19/15 1248 03/19/15 1708 03/19/15 2117 03/20/15 0734 03/20/15 1124  GLUCAP 279* 225* 146* 185* 316*   Microbiology Recent Results (from the past 240 hour(s))  Urine culture     Status: None   Collection Time: 03/17/15 11:26 PM  Result Value Ref Range Status   Specimen Description URINE, RANDOM  Final   Special Requests NONE  Final   Culture   Final    NO GROWTH 1 DAY Performed at [facility]    Report Status 03/19/2015 FINAL  Final  MRSA PCR Screening     Status: Abnormal   Collection Time: 03/18/15  1:15 AM  Result Value Ref Range Status   MRSA by PCR POSITIVE (A) NEGATIVE Final    Comment:        The GeneXpert MRSA Assay (FDA approved for NASAL specimens only), is one component of a comprehensive MRSA colonization surveillance program. It is not intended to diagnose MRSA infection nor to guide or monitor treatment for MRSA infections. RESULT CALLED TO, READ BACK BY AND VERIFIED WITH: Bluford Kaufmann [id number] @ 0300 BY J SCOTTON   Culture, blood (routine x 2)     Status: None (Preliminary result)   Collection Time: 03/18/15  2:00 AM  Result Value Ref Range Status   Specimen Description BLOOD LEFT ARM  Final   Special Requests IN PEDIATRIC BOTTLE 4CC  Final   Culture   Final    NO GROWTH 2 DAYS Performed at [facility]    Report Status PENDING  Incomplete  Culture, blood (routine x 2)     Status: None (Preliminary result)   Collection Time: 03/18/15  2:26 AM  Result Value Ref Range Status   Specimen Description BLOOD RIGHT HAND  Final   Special Requests IN PEDIATRIC BOTTLE 2CC  Final   Culture   Final    NO GROWTH 2 DAYS Performed at [facility]    Report Status PENDING  Incomplete     Discharge Instructions:    Discharge Instructions    Activity as tolerated - No restrictions    Complete by:  As directed      Call MD for:  temperature >100.4    Complete by:  As directed      Call MD for:    Complete by:  As directed   Uncontrolled blood glucoses.     Diet Carb Modified    Complete by:  As directed      Discharge instructions    Complete by:  As directed   CBG < 70: Drink juice; CBG 70 - 120: 0 units: CBG 121 - 150: 2 units; Take 5 units of Novolog before each meal.  Check your blood sugar before each meal and add the following amounts of insulin based on your blood glucose:  If CBG 151 - 200: 3 units; CBG 201 - 250: 5 units; CBG 251 - 300: 8 units;CBG 301 - 350: 11 units; CBG 351 - 400: 15 units. CBG > 400 Call MD.    Check your blood sugar before bedtime, and cover yourself with the following amounts of insulin:  CBG < 70: Drink juice;       CBG 70 -200 (dose in units): 0 units;  CBG 201 - 250: 2 units; CBG 251 - 300: 3 units; CBG 301 - 350: 4 units; CBG 351 - 400: 5 units;CBG > 400 Call MD.  Dennis Bast may resume your pump tomorrow morning.  Call your PCP immediately if your blood sugars start rising and remain persistently elevated above 200, as your pump settings may need to be adjusted.            Medication List    STOP taking these medications        insulin pump Soln      TAKE these medications        albuterol 108 (90 BASE) MCG/ACT inhaler  Commonly known as:  VENTOLIN HFA  Inhale 1-2 puffs into the lungs every 4 (four) hours as needed for wheezing or  shortness of breath.     atorvastatin 40 MG tablet  Commonly known as:  LIPITOR  TAKE ONE TABLET BY MOUTH ONCE DAILY     clonazePAM 1 MG tablet  Commonly known as:  KLONOPIN  TAKE 1 TABLET BY MOUTH IN THE MORNING, 1 TABLET AT  2 PM, AND 2 TABLETS AT BEDTIME     ferrous sulfate 325 (65 FE) MG EC tablet  Take 1 tablet (325 mg total) by mouth 2 (two) times daily.     glucose blood test strip  Commonly known as:  BAYER  CONTOUR TEST  Use to test blood sugar 6 times daily as instructed. Dx: E10.65     glucose blood test strip  Commonly known as:  BAYER CONTOUR NEXT TEST  Use to test blood sugar 6 times daily as instructed. Dx: E10.65     insulin aspart 100 UNIT/ML injection  Commonly known as:  novoLOG  Inject 0-15 Units into the skin 3 (three) times daily with meals.     insulin aspart 100 UNIT/ML injection  Commonly known as:  novoLOG  Inject 0-5 Units into the skin at bedtime.     insulin aspart 100 UNIT/ML injection  Commonly known as:  novoLOG  Inject 5 Units into the skin 3 (three) times daily with meals.     mupirocin ointment 2 %  Commonly known as:  BACTROBAN  Place 1 application into the nose 2 (two) times daily.     PHARMACIST CHOICE LANCETS Misc  Test 6 times daily     promethazine 12.5 MG tablet  Commonly known as:  PHENERGAN  Take 1 tablet (12.5 mg total) by mouth every 6 (six) hours as needed for nausea or vomiting.     venlafaxine XR 150 MG 24 hr capsule  Commonly known as:  EFFEXOR-XR  Take 2 capsules (300 mg total) by mouth daily.           Follow-up Information    Follow up with Philemon Kingdom, MD. Schedule an appointment as soon as possible for a visit in 1 week.   Specialty:  Internal Medicine   Why:  Diabetes follow up.   Contact information:   [address] [place] [address] [phone]        Time coordinating discharge: 35 minutes.  Signed:  Maymunah Stegemann  Pager [phone] [facility] 03/20/2015, 2:42 PM

## 2015-03-20 NOTE — Progress Notes (Signed)
MD notified regarding recommendations of diabetes coordinator. Per MD, pt can still d/c today if pt comfortable with administering sliding scale insulin today and restarting pump in am. Patient reports she is comfortable with insulin injections as she used to administer her insulin this way prior to pump therapy.  She also states she is comfortable with restarting pump in am.  Per pt, pump is fully operational.  RN questioned pt regarding statements that she ran out of insulin and why she did not respond to alarms from pump at that time.  Patient reports that she only receives three vials of insulin for three months through her insurance and occasionally runs out of insulin before insurance will allow her to get a refill again.  Pt reports this is due to at times using more insulin when she needs to cover for higher amounts of carb intake.  Pt reports that when she ran out of insulin this time it was too soon to get another refill, however a couple days later when is was time, spouse went and obtained refill, however patient states she was already sick, throwing up and disoriented at that time.  RN asked pt regarding reports from spouse that she took 200 units of insulin along with morphine and valium.  Pt reports that this is not accurate, that spouse does not manage her medications and does not know how much she takes, furthermore morphine and valium are medications that spouse takes and not the patient.  Patient states spouse gets confused and is not a reliable source of information regarding her medications.  MD notified regarding patient reports that she is comfortable with sliding scale insulin and restarting pump in am, pt also states she will contact her physician to see of settings need to be adjusted.

## 2015-03-20 NOTE — Progress Notes (Signed)
Patient discharged home, all discharge medications and instructions reviewed and questions answered. Patient to be assisted to vehicle by wheelchair when ride arrives. 

## 2015-03-20 NOTE — Progress Notes (Signed)
Inpatient Diabetes Program Recommendations  AACE/ADA: New Consensus Statement on Inpatient Glycemic Control (2013)  Target Ranges:  Prepandial:   less than 140 mg/dL      Peak postprandial:   less than 180 mg/dL (1-2 hours)      Critically ill patients:  140 - 180 mg/dL   Paged by RN to assist patient with restarting insulin pump.  Have reviewed history of admission and talked with RN regarding how DKA developed. RN reports patient states she ran out of insulin in the pump. Based on the reason for the patient to have developed DKA due to lack of insulin in the pump (which should have audibly alarmed patient) and based on her recent  history including confusion and need/use for other medications that may alter her mental ability to use her insulin pump safely, I would not recommend patient resume her pump prior to discharge. Patient has also already been given her 30 units of basal insulin which would put patient at risk for hypoglycemia if the pump was reinitiated in the same 24 hr period. Diabetes coordinator has seen patient on admission; I will try to contact the patient by phone to instruct her to call her primary MD who is managing her insulin pump. For discharge I recommend that patient continue the correction as ordered here (moderate tidwc and HS correction scale and 5 units meal coverage. If her MD instructs her to resume her insulin pump, she could resume tomorrow am according to her primary MD managing her insulin pump. I continue to try to call patient however presently the phone line has been busy.  Thank you Rosita Kea, RN, MSN, CDE  Diabetes Inpatient Program Office: [phone] Pager: [phone] 8:00 am to 5:00 pm

## 2015-03-22 ENCOUNTER — Telehealth: Payer: Self-pay | Admitting: Family Medicine

## 2015-03-22 ENCOUNTER — Other Ambulatory Visit: Payer: Self-pay | Admitting: *Deleted

## 2015-03-22 MED ORDER — VENLAFAXINE HCL ER 150 MG PO CP24: 300.0000 mg | ORAL_CAPSULE | Freq: Every day | ORAL | Status: DC

## 2015-03-22 MED ORDER — CLONAZEPAM 1 MG PO TABS: ORAL_TABLET | ORAL | Status: DC

## 2015-03-22 NOTE — Telephone Encounter (Signed)
LM with pt's husband to have pt CB regarding recent hospital visit.

## 2015-03-22 NOTE — Telephone Encounter (Signed)
Transition Care Management Follow-up Telephone Call   Date discharged? 03/21/15   How have you been since you were released from the hospital? Pt feels better.    Do you understand why you were in the hospital? yes   Do you understand the discharge instructions? yes   Where were you discharged to? Home   Items Reviewed:  Medications reviewed: yes  Allergies reviewed: yes  Dietary changes reviewed: yes  Referrals reviewed: no   Functional Questionnaire:   Activities of Daily Living (ADLs):   She states they are independent in the following: none States they require assistance with the following: none   Any transportation issues/concerns?: no   Any patient concerns? Yes, pt told she has bladder infection but wasn't given anything for it.   Confirmed importance and date/time of follow-up visits scheduled yes  Provider Appointment booked with Dr. Anitra Lauth 03/24/15 @ 8:15am.   Confirmed with patient if condition begins to worsen call PCP or go to the ER.  Patient was given the office number and encouraged to call back with question or concerns.  : yes

## 2015-03-22 NOTE — Telephone Encounter (Signed)
LMOM for pt to CB at earliest convenience.

## 2015-03-22 NOTE — Telephone Encounter (Signed)
LOV: 12/27/14 NOV: 04/28/15  RF request for venlafaxine Last written: 01/05/15 #60 w/ 0RF  RF request for klonopin Last written: 01/05/15 #120 w/ 0RF  Please advise. Thanks.

## 2015-03-23 LAB — CULTURE, BLOOD (ROUTINE X 2)
CULTURE: NO GROWTH
CULTURE: NO GROWTH

## 2015-03-23 LAB — BLOOD GAS, VENOUS
Acid-base deficit: 13.5 mmol/L — ABNORMAL HIGH (ref 0.0–2.0)
BICARBONATE: 14.3 meq/L — AB (ref 20.0–24.0)
Drawn by: 345601
O2 Saturation: 41.8 %
PCO2 VEN: 40.5 mmHg — AB (ref 45.0–50.0)
PO2 VEN: 27.4 mmHg — AB (ref 30.0–45.0)
Patient temperature: 98.6
TCO2: 13.7 mmol/L (ref 0–100)
pH, Ven: 7.174 — CL (ref 7.250–7.300)

## 2015-03-24 ENCOUNTER — Encounter: Payer: Self-pay | Admitting: Internal Medicine

## 2015-03-24 ENCOUNTER — Ambulatory Visit (INDEPENDENT_AMBULATORY_CARE_PROVIDER_SITE_OTHER): Payer: PPO | Admitting: Internal Medicine

## 2015-03-24 ENCOUNTER — Encounter: Payer: Self-pay | Admitting: Family Medicine

## 2015-03-24 ENCOUNTER — Other Ambulatory Visit: Payer: Self-pay | Admitting: *Deleted

## 2015-03-24 ENCOUNTER — Ambulatory Visit (INDEPENDENT_AMBULATORY_CARE_PROVIDER_SITE_OTHER): Payer: PPO | Admitting: Family Medicine

## 2015-03-24 VITALS — BP 118/64 | HR 85 | Temp 97.7°F | Resp 12 | Wt 237.0 lb

## 2015-03-24 VITALS — BP 138/82 | HR 85 | Temp 97.9°F | Resp 16 | Wt 233.0 lb

## 2015-03-24 DIAGNOSIS — E785 Hyperlipidemia, unspecified: Secondary | ICD-10-CM

## 2015-03-24 DIAGNOSIS — IMO0002 Reserved for concepts with insufficient information to code with codable children: Secondary | ICD-10-CM

## 2015-03-24 DIAGNOSIS — E1065 Type 1 diabetes mellitus with hyperglycemia: Secondary | ICD-10-CM

## 2015-03-24 DIAGNOSIS — F419 Anxiety disorder, unspecified: Secondary | ICD-10-CM

## 2015-03-24 DIAGNOSIS — F418 Other specified anxiety disorders: Secondary | ICD-10-CM

## 2015-03-24 DIAGNOSIS — Z23 Encounter for immunization: Secondary | ICD-10-CM

## 2015-03-24 DIAGNOSIS — I10 Essential (primary) hypertension: Secondary | ICD-10-CM

## 2015-03-24 DIAGNOSIS — F32A Depression, unspecified: Secondary | ICD-10-CM

## 2015-03-24 DIAGNOSIS — F329 Major depressive disorder, single episode, unspecified: Secondary | ICD-10-CM

## 2015-03-24 LAB — LIPID PANEL
CHOL/HDL RATIO: 4
CHOLESTEROL: 169 mg/dL (ref 0–200)
HDL: 45.1 mg/dL (ref >39.00)
LDL Cholesterol: 101 mg/dL — ABNORMAL HIGH (ref 0–99)
NonHDL: 124.37
TRIGLYCERIDES: 115 mg/dL (ref 0.0–149.0)
VLDL: 23 mg/dL (ref 0.0–40.0)

## 2015-03-24 LAB — POCT GLYCOSYLATED HEMOGLOBIN (HGB A1C): Hemoglobin A1C: 9.1

## 2015-03-24 NOTE — Addendum Note (Signed)
Addended by: Lanae Crumbly on: 03/24/2015 08:34 AM   Modules accepted: Orders

## 2015-03-24 NOTE — Patient Instructions (Signed)
Please change the pump settings: - basal rates:  12 am: 1.7 6 am: 1.8  11 am: 1.8 - ICR:  12 am: 7.0  11 am: 7.0 >> 6 5 pm: 7.0 >> 6 - target: 100-110 - ISF: 20 >> 25 - Insulin on Board: 4h  Please check sugars and bolus 15 min before every meal and also check at bedtime.  Please return in 3 months.
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
## 2015-03-24 NOTE — Progress Notes (Signed)
Patient ID: Kristie [name], female   DOB: 1966/01/19, 49 y.o.   MRN: [id number]  HPI: Kristie [name] is a 49 y.o.-year-old female, returning for follow-up for DM1, dx 2001 (GDM), uncontrolled, without complications. Last visit 3 mo ago.  At last visit, she had just had a IUD placed at the beginning of 11/2014 >> bleeding x 2 weeks after this; exhaustion, dizziness, palpitations, leg swelling, weight gain, irritability, depression >> she discussed with her OB/GYN at that time, and he was considered that taking out the IUD would be not feasible due to her history of iron deficiency anemia and excessive bleeding. She now feels better, but not completely back to baseline.  She is doing a better job in [place] with her meals.  She is having pbs affording Novolog (126 $/mo).  Last hemoglobin A1c was: Lab Results  Component Value Date   HGBA1C 12.0* 12/20/2014   HGBA1C 9.8* 06/03/2014   HGBA1C 10.0* 02/16/2014   Pt is on an insulin pump: Medtronic 723 x 1 mo, without CGM, uses Novolog in the pump.  Pump settings: - basal rates:  12 am: 1.4 >> 1.7 6 am: 1.5 >> 1.8  11 am: 1.4 >> 1.7 >> 1.8 Continue to increase the basal rates by 0.1 units/h if sugars stay high during the respective period. - ICR:  12 am: 7.0  11 am: 7.5 >> 7.0 5 pm: 7.5 >> 7.0 - target: 100-110 - ISF: 30 >> 25 >> 20 - Insulin on Board: 4h - extended bolusing: not using - changes infusion site: q3 days - Meter: Bayer Contour Basal insulin tdd 65% (34.7 units) Bolus insulin tdd 35% (18.5 units)  Pt checks her sugars 2.6 a day and they are still high especially in am- ave 217 +/-68 >> 250 +/-77 >> improved: 185 +/- 91:   Previously:  - am: 133-293 >> 97, 197-251, 296 >> 129-251, >400 >> 63-256 - 2h after b'fast: 150-395 >> 65, 129-181 >> 108-209 >> 72-258 - before lunch: 165-240 >> 128-266 >> 157-267 >> 100-241,>400 - 2h after lunch: 109-337 >> 80, 201-273 >> 171-386 >> 49, 88-383 - before dinner: 55 x1 (very busy  that day), 133-275 >> 78, 172-289 >> 181-296 >> 94-226, 349 - 2h after dinner: 127, 213-345 >> 96, 163-243 >> 157-370>> 71, 205-356 - bedtime: 90, 213, 301 >> 208-220 >> see above - nighttime: 261 >> x1 122 >> 211-334 >> 121 He does  upload her sugars. No lows. Lowest sugar was 37 >> 65 >> 108 >> 49; she has hypoglycemia awareness at 60s. No previous hypoglycemia admission.  Highest sugar was HI >> 350 >> >400. Had 1x previous DKA admissions: admitted for 10 days (in 07/2013).  Pt's meals are: - Breakfast: 2 eggs, toast, cheese, but most of the times she skips - Lunch: sandwich - Dinner: backed chicken or porkchops - Snacks: yoghurt, fruit She used to walk for exercise 2x a week, plans to restart when she is feeling better.  - no CKD, last BUN/creatinine:  Lab Results  Component Value Date   BUN 9 03/20/2015   CREATININE 0.65 03/20/2015  Not on an ACEI. - last set of lipids: Lab Results  Component Value Date   CHOL 196 02/16/2014   HDL 58.10 02/16/2014   LDLCALC 128* 02/16/2014   LDLDIRECT 167.1 04/02/2013   TRIG 48.0 02/16/2014   CHOLHDL 3 02/16/2014  Not on a statin. - last eye exam was in 07/2014. No DR.  - + numbness and tingling in  her feet (mostly big toes)  Last TSH was normal: Lab Results  Component Value Date   TSH 1.278 12/04/2014   I reviewed pt's medications, allergies, PMH, social hx, family hx, and changes were documented in the history of present illness. Otherwise, unchanged from my initial visit note.  ROS: Constitutional: + weight gain, + fatigue, + subjective hypo and hyper-thermia,+  dysuria Eyes: + blurry vision, no xerophthalmia ENT: no sore throat, no nodules palpated in throat, no dysphagia/odynophagia, no hoarseness Cardiovascular: no CP/+ SOB/no palpitations/no leg swelling Respiratory: no cough/+ SOB Gastrointestinal:+ N/+ V/no D/C Musculoskeletal: no muscle/joint aches Skin: no rashes, + hair loss  Neurological: no  tremors/numbness/tingling/dizziness + low libido  PE: BP 118/64 mmHg  Pulse 85  Temp(Src) 97.7 F (36.5 C) (Oral)  Resp 12  Wt 237 lb (107.502 kg)  SpO2 98%  LMP  (LMP Unknown) Body mass index is 37.11 kg/(m^2).  Wt Readings from Last 3 Encounters:  03/24/15 237 lb (107.502 kg)  03/24/15 233 lb (105.688 kg)  03/19/15 243 lb 6.4 oz (110.406 kg)   Constitutional: overweight, in NAD Eyes: PERRLA, EOMI, no exophthalmos ENT: moist mucous membranes, no thyromegaly, no cervical lymphadenopathy Cardiovascular: RRR, No MRG Respiratory: CTA B Gastrointestinal: abdomen soft, NT, ND, BS+ Musculoskeletal: no deformities, strength intact in all 4 Skin: moist, warm, no rashes Neurological: no tremor with outstretched hands, DTR normal in all 4  ASSESSMENT: 1. DM1, uncontrolled, without complications (?PN) - on pump Component     Latest Ref Rng 09/24/2013  Glucose, Fasting     70 - 99 mg/dL 121 (H)  C-Peptide     0.80 - 3.90 ng/mL <0.10 (L)  - Needs 4 vials of insulin a month   PLAN:  1. Patient with long-standing, uncontrolled DM1, on insulin pump therapy, with improved control in the last 3 months. sugars are increasing as the day goes by, a sign of not enough mealtime insulin. She is bolusing more than before, but still not enough. She has days in which she only boluses once a day. She is now changing the infusion site mostly every 3 days. She is trying to start eating breakfast, was advised to eat a protein shake, which she is preparing to buy. We discussed about the way to cover this with insulin. To improve her postprandial CBGs, I will decrease her ICR with lunch and dinner. We'll also decrease her ISF since she does have low blood sugar after correcting highs. I  again asked her to to check her sugars more frequentlyand to bolus for each of the meals.  - We also discussed about the fact that we may need to switch to regular insulin if she continues to have problems affording the rapid  acting insulin analog.   Patient Instructions  Please change the pump settings: - basal rates:  12 am: 1.7 6 am: 1.8  11 am: 1.8 - ICR:  12 am: 7.0  11 am: 7.0 >> 6 5 pm: 7.0 >> 6 - target: 100-110 - ISF: 20 >> 25 - Insulin on Board: 4h  Please check sugars and bolus 15 min before every meal and also check at bedtime.  Please return in 3 months.   - continue checking sugars at different times of the day - check at least 4 times a day, rotating checks - she is up to date with eye exams - already seeing DM education >> recommended to see [name] again  - check HbA1c >> HbA1c 9.3% (Improved!) - no signs  of other autoimmune disorders - reviewed latest TSH - Return to clinic in 3 mo with sugar log   - time spent with the patient: 40 min, of which >50% was spent in reviewing her pump download, discussing her hypo- and hyper-glycemic episodes, reviewing her previous labs and pump settings and developing a plan to avoid hypo- an hyper-glycemia.

## 2015-03-24 NOTE — Progress Notes (Signed)
Pre visit review using our clinic review tool, if applicable. No additional management support is needed unless otherwise documented below in the visit note. 

## 2015-04-04 ENCOUNTER — Telehealth: Payer: Self-pay | Admitting: Internal Medicine

## 2015-04-04 NOTE — Telephone Encounter (Signed)
She is getting ready to run out of insulin again and has only 3 days left, pt wasted one bottle so she is short because she was sick.  What can we do to help her get the insulin she needs?

## 2015-04-05 NOTE — Telephone Encounter (Signed)
Vaughan Basta, please read message below and advise. Thank you.

## 2015-04-05 NOTE — Telephone Encounter (Signed)
Patient stated that she is out of insulin and can't get it filled until October, she need something to help her until then. Please advise

## 2015-04-05 NOTE — Telephone Encounter (Signed)
Please read message below. We are waiting to hear from [employer] Patient Asst. Program. Please advise.

## 2015-04-05 NOTE — Telephone Encounter (Signed)
Do we have a sample vial to give her? Maybe Vaughan Basta can help... If not, we can maybe use Regular insulin. Please let me know.

## 2015-04-07 ENCOUNTER — Telehealth: Payer: Self-pay | Admitting: *Deleted

## 2015-04-07 NOTE — Telephone Encounter (Signed)
Called pt and lvm advising her that [employer] sent a fax advising her that they will not be able to offer their patient asst. Program due to her having government prescription coverage. Advised pt that if this has changed or she has any questions that she needs to contact them at [phone].

## 2015-04-18 ENCOUNTER — Telehealth: Payer: Self-pay | Admitting: Nutrition

## 2015-04-18 NOTE — Telephone Encounter (Signed)
See previous telephone encounter.

## 2015-04-18 NOTE — Telephone Encounter (Signed)
I phoned her this Am, to see if she got the insulin I left up front for her.  She said she did and that her blood sugars "are all perfect".  She is very pleased.  She has not heard for [employer] and i gave her the number to call.  She was also given the number to the low income subsity program to get help from social security.  She said she would call them.

## 2015-04-28 ENCOUNTER — Ambulatory Visit: Payer: PPO | Admitting: Family Medicine

## 2015-04-28 DIAGNOSIS — Z0289 Encounter for other administrative examinations: Secondary | ICD-10-CM

## 2015-05-27 NOTE — Patient Outreach (Signed)
[place] [facility]) Care Management  05/27/2015  Kristie [name] Dec 29, 1965 [id number]   Referral from HTA tier 3 list, assigned to Jon Billings, RN for patient outreach.  Deshonna Trnka L. Rameen Gohlke, [place] Care Management Assistant

## 2015-05-30 ENCOUNTER — Other Ambulatory Visit: Payer: Self-pay

## 2015-05-30 NOTE — Patient Outreach (Signed)
[place] [facility]) Care Management  05/30/2015  TARIAH LAMBERTY [birth year] [id number]   Telephone call attempt for HTA Tier 3 referral.  No answer. HIPAA compliant voice message left.    Plan: RN Health Coach will contact patient within 1-2- weeks.    [name], RN, MSN [place] [phone]

## 2015-06-01 ENCOUNTER — Other Ambulatory Visit: Payer: Self-pay

## 2015-06-01 NOTE — Patient Outreach (Signed)
[place] [facility]) Care Management  06/01/2015  MEKENZI MLECZKO September 27, 1965 [id number]  2nd telephone call to patient for HTA Tier 3 list.  No answer.  HIPAA compliant voice message left.    Plan: RN Health Coach will contact patient within 1-2 weeks.    [name], RN, MSN [place] [phone]

## 2015-06-06 ENCOUNTER — Other Ambulatory Visit: Payer: Self-pay

## 2015-06-06 NOTE — Patient Outreach (Signed)
[place] [facility]) Care Management  06/06/2015  BARBAR LABELL 09/17/1965 [id number]   3rd telephone call to patient for HTA Tier 3 list.  No answer.  HIPAA compliant voice message left.   Plan: RN Health  Coach will send letter to attempt outreach.    [name], RN, MSN [place] [phone]

## 2015-06-07 ENCOUNTER — Other Ambulatory Visit: Payer: Self-pay | Admitting: Internal Medicine

## 2015-06-07 ENCOUNTER — Other Ambulatory Visit: Payer: Self-pay | Admitting: *Deleted

## 2015-06-07 MED ORDER — GLUCOSE BLOOD VI STRP: ORAL_STRIP | Status: DC

## 2015-06-14 ENCOUNTER — Other Ambulatory Visit: Payer: Self-pay | Admitting: *Deleted

## 2015-06-14 MED ORDER — ADVOCATE LANCETS 30G MISC: 1.0000 | Freq: Four times a day (QID) | Status: DC

## 2015-06-16 ENCOUNTER — Other Ambulatory Visit: Payer: Self-pay | Admitting: Internal Medicine

## 2015-06-18 ENCOUNTER — Other Ambulatory Visit: Payer: Self-pay | Admitting: Internal Medicine

## 2015-06-20 ENCOUNTER — Other Ambulatory Visit: Payer: Self-pay

## 2015-06-20 ENCOUNTER — Other Ambulatory Visit: Payer: Self-pay | Admitting: *Deleted

## 2015-06-20 MED ORDER — INSULIN ASPART 100 UNIT/ML ~~LOC~~ SOLN: 100.0000 [IU] | Freq: Every day | SUBCUTANEOUS | Status: DC

## 2015-06-20 NOTE — Patient Outreach (Signed)
[place] [facility]) Care Management  06/20/2015  Kristie [name] [birth year] [id number]   No response from patient after 3 outreach calls and letter.  Plan: RN Health Coach will forward patient information to Lurline Del, Care Management Assistant for case closure.   [name], RN, MSN [place] [phone]

## 2015-06-23 ENCOUNTER — Encounter: Payer: Self-pay | Admitting: Internal Medicine

## 2015-06-23 ENCOUNTER — Other Ambulatory Visit (INDEPENDENT_AMBULATORY_CARE_PROVIDER_SITE_OTHER): Payer: PPO | Admitting: *Deleted

## 2015-06-23 ENCOUNTER — Ambulatory Visit (INDEPENDENT_AMBULATORY_CARE_PROVIDER_SITE_OTHER): Payer: PPO | Admitting: Internal Medicine

## 2015-06-23 VITALS — BP 112/68 | HR 99 | Temp 98.1°F | Resp 12 | Wt 239.0 lb

## 2015-06-23 DIAGNOSIS — E1065 Type 1 diabetes mellitus with hyperglycemia: Secondary | ICD-10-CM

## 2015-06-23 LAB — POCT GLYCOSYLATED HEMOGLOBIN (HGB A1C): HEMOGLOBIN A1C: 9.3

## 2015-06-23 NOTE — Progress Notes (Signed)
Patient ID: Kristie [name], female   DOB: [birth year], 49 y.o.   MRN: [id number]  HPI: Kristie [name] is a 49 y.o.-year-old female, returning for follow-up for DM1, dx 2001 (GDM), uncontrolled, without complications. Last visit 3 mo ago.  She is still having pbs affording Novolog (126 $/mo).  Last hemoglobin A1c was: Lab Results  Component Value Date   HGBA1C 9.1 03/24/2015   HGBA1C 12.0* 12/20/2014   HGBA1C 9.8* 06/03/2014   Pt is on an insulin pump: Medtronic 723 x 1 mo, without CGM, uses Novolog in the pump.  Pump settings (reviewing her pump downloads, she did not institute the changes that I suggested last time - ???): - basal rates:  12 am: 1.7 6 am: 1.8  11 am: 1.8 - ICR:  12 am: 7.0  11 am: 7.0 5 pm: 7.0  - target: 100-110 - ISF: 20  - Insulin on Board: 4h- extended bolusing: not using - changes infusion site: q3 days - Meter: Bayer Contour Basal insulin tdd 61% (42.4 units) Bolus insulin tdd 39% (26.8 units)  Pt checks her sugars 2.6 a day and they are still high especially in am- ave 217 +/-68 >> 250 +/-77 >> 185 +/- 91 >> 201 +/- 77:  Previously:   - am: 133-293 >> 97, 197-251, 296 >> 129-251, >400 >> 63-256 >> 96-156, 276 - 2h after b'fast: 150-395 >> 65, 129-181 >> 108-209 >> 72-258 >> 148-245 - before lunch: 165-240 >> 128-266 >> 157-267 >> 100-241,>400 >> 142-266 - 2h after lunch: 109-337 >> 80, 201-273 >> 171-386 >> 49, 88-383 >> 127-206, 366, >400 x1 - before dinner: 55 x1 (very busy that day), 133-275 >> 78, 172-289 >> 181-296 >> 94-226, 349 >> 80, 99, 173-255, 345 - 2h after dinner: 127, 213-345 >> 96, 163-243 >> 157-370>> 71, 205-356 >> 177, 251 - bedtime: 90, 213, 301 >> 208-220 >> see above - nighttime: 261 >> x1 122 >> 211-334 >> 121 >> n/c He does  upload her sugars. No lows. Lowest sugar was 37 >> 65 >> 108 >> 49 >> 80; she has hypoglycemia awareness at 60s. No previous hypoglycemia admission.  Highest sugar was HI >> 350 >> >400. Had 1x previous  DKA admissions: admitted for 10 days (in 07/2013).  Pt's meals are: - Breakfast: 2 eggs, toast, cheese, but most of the times she skips - Lunch: sandwich - Dinner: backed chicken or porkchops - Snacks: yoghurt, fruit She is walking for exercise 2x a week, plans to restart when she is feeling better.  - no CKD, last BUN/creatinine:  Lab Results  Component Value Date   BUN 9 03/20/2015   CREATININE 0.65 03/20/2015  Not on an ACEI. - last set of lipids: Lab Results  Component Value Date   CHOL 169 03/24/2015   HDL 45.10 03/24/2015   LDLCALC 101* 03/24/2015   LDLDIRECT 167.1 04/02/2013   TRIG 115.0 03/24/2015   CHOLHDL 4 03/24/2015  Not on a statin. - last eye exam was in 07/2014. No DR.  - + numbness and tingling in her feet (mostly big toes)  Last TSH was normal: Lab Results  Component Value Date   TSH 1.278 12/04/2014   I reviewed pt's medications, allergies, PMH, social hx, family hx, and changes were documented in the history of present illness. Otherwise, unchanged from my initial visit note.  ROS: Constitutional: + weight gain, no fatigue, + subjective hypothermia, no  dysuria Eyes: no blurry vision, no xerophthalmia ENT: no sore throat, no  nodules palpated in throat, no dysphagia/odynophagia, no hoarseness Cardiovascular: no CP/SOB/no palpitations/no leg swelling Respiratory: no cough/SOB Gastrointestinal:no N/V/D/C Musculoskeletal: no muscle/joint aches Skin: no rashes Neurological: no tremors/numbness/tingling/dizziness, + HA  PE: BP 112/68 mmHg  Pulse 99  Temp(Src) 98.1 F (36.7 C) (Oral)  Resp 12  Wt 239 lb (108.41 kg)  SpO2 98% Body mass index is 37.42 kg/(m^2).  Wt Readings from Last 3 Encounters:  06/23/15 239 lb (108.41 kg)  03/24/15 237 lb (107.502 kg)  03/24/15 233 lb (105.688 kg)   Constitutional: overweight, in NAD Eyes: PERRLA, EOMI, no exophthalmos ENT: moist mucous membranes, no thyromegaly, no cervical lymphadenopathy Cardiovascular:  RRR, No MRG Respiratory: CTA B Gastrointestinal: abdomen soft, NT, ND, BS+ Musculoskeletal: no deformities, strength intact in all 4 Skin: moist, warm, no rashes Neurological: no tremor with outstretched hands, DTR normal in all 4  ASSESSMENT: 1. DM1, uncontrolled, without complications (?PN) - on pump Component     Latest Ref Rng 09/24/2013  Glucose, Fasting     70 - 99 mg/dL 121 (H)  C-Peptide     0.80 - 3.90 ng/mL <0.10 (L)  - Needs 4 vials of insulin a month   PLAN:  1. Patient with long-standing, uncontrolled DM1, on insulin pump therapy, with improved control before last visit. At last visit, I made suggestions about decreasing her insulin to carb ratio and increasing her insulin sensitivity factor, however, at this visit, I see that she did not introduced easily into the pump. I suggested that she starts making those changes to improve her high sugars later in the day. She usually wakes up with fairly good blood sugars which increased throughout the day. She is bolusing more than before, but still not enough. She still has days in which she only boluses once a day. She is now changing the infusion site mostly every 4 days.  - I introduced the below changes in her pump, since she was not comfortable doing this. I showed her how to do it, and she is comfortable with changing the settings herself now. Patient Instructions  Please change the pump settings: - basal rates:  12 am: 1.7 6 am: 1.8  11 am: 1.8 - ICR:  12 am: 7.0  11 am: 7.0 >> 6 5 pm: 7.0 >> 6 - target: 100-110 - ISF: 20 >> 25 - Insulin on Board: 4h  Please check sugars and bolus 15 min before every meal and also check at bedtime.  Please return in 3 months.   - continue checking sugars at different times of the day - check at least 4 times a day, rotating checks - she is up to date with eye exams and solution - check HbA1c >> HbA1c 9.3% (stable, high) - no signs of other autoimmune disorders - reviewed latest  TSH >> normal - Return to clinic in 3 mo with sugar log   - time spent with the patient: 40 min, of which >50% was spent in reviewing her pump download, discussing her hypo- and hyper-glycemic episodes, reviewing her previous labs and pump settings and developing a plan to avoid hypo- an hyper-glycemia.

## 2015-06-23 NOTE — Patient Instructions (Signed)
Please change the pump settings: - basal rates:  12 am: 1.7 6 am: 1.8  11 am: 1.8 - ICR:  12 am: 7.0  11 am: 7.0 >> 6 5 pm: 7.0 >> 6 - target: 100-110 - ISF: 20 >> 25 - Insulin on Board: 4h  Please check sugars and bolus 15 min before every meal and also check at bedtime.  Please return in 3 months.   

## 2015-07-27 DIAGNOSIS — E1065 Type 1 diabetes mellitus with hyperglycemia: Secondary | ICD-10-CM | POA: Diagnosis not present

## 2015-07-30 ENCOUNTER — Other Ambulatory Visit: Payer: Self-pay | Admitting: Family Medicine

## 2015-08-17 ENCOUNTER — Telehealth: Payer: Self-pay | Admitting: Internal Medicine

## 2015-08-17 NOTE — Telephone Encounter (Signed)
Kristie [name] with [facility] called inquiring about a prescription.  She would like to speak to you.

## 2015-08-19 ENCOUNTER — Telehealth: Payer: Self-pay | Admitting: Internal Medicine

## 2015-08-19 NOTE — Telephone Encounter (Signed)
Janett Billow from [facility] need a verbal order for patient diabetic supply's [phone]

## 2015-09-12 ENCOUNTER — Ambulatory Visit (INDEPENDENT_AMBULATORY_CARE_PROVIDER_SITE_OTHER): Payer: PPO | Admitting: Family Medicine

## 2015-09-12 ENCOUNTER — Encounter: Payer: Self-pay | Admitting: Family Medicine

## 2015-09-12 VITALS — BP 118/84 | HR 90 | Temp 97.3°F | Resp 20 | Wt 242.5 lb

## 2015-09-12 DIAGNOSIS — J0100 Acute maxillary sinusitis, unspecified: Secondary | ICD-10-CM | POA: Diagnosis not present

## 2015-09-12 MED ORDER — FLUTICASONE PROPIONATE 50 MCG/ACT NA SUSP: NASAL | Status: DC

## 2015-09-12 MED ORDER — AMOXICILLIN-POT CLAVULANATE 875-125 MG PO TABS: 1.0000 | ORAL_TABLET | Freq: Two times a day (BID) | ORAL | Status: DC

## 2015-09-12 NOTE — Patient Instructions (Signed)
1. Acute maxillary sinusitis, recurrence not specified - Monitor sugars closely with infection.  - rest and hydrated. - Flonase prescribed. Use sugar free products for cough/supportive therapy.  - amoxicillin-clavulanate (AUGMENTIN) 875-125 MG tablet; Take 1 tablet by mouth 2 (two) times daily.  Dispense: 20 tablet; Refill: 0 - F/U as needed or worsening symptoms.  Sinusitis, Adult Sinusitis is redness, soreness, and inflammation of the paranasal sinuses. Paranasal sinuses are air pockets within the bones of your face. They are located beneath your eyes, in the middle of your forehead, and above your eyes. In healthy paranasal sinuses, mucus is able to drain out, and air is able to circulate through them by way of your nose. However, when your paranasal sinuses are inflamed, mucus and air can become trapped. This can allow bacteria and other germs to grow and cause infection. Sinusitis can develop quickly and last only a short time (acute) or continue over a long period (chronic). Sinusitis that lasts for more than 12 weeks is considered chronic. CAUSES Causes of sinusitis include:  Allergies.  Structural abnormalities, such as displacement of the cartilage that separates your nostrils (deviated septum), which can decrease the air flow through your nose and sinuses and affect sinus drainage.  Functional abnormalities, such as when the small hairs (cilia) that line your sinuses and help remove mucus do not work properly or are not present. SIGNS AND SYMPTOMS Symptoms of acute and chronic sinusitis are the same. The primary symptoms are pain and pressure around the affected sinuses. Other symptoms include:  Upper toothache.  Earache.  Headache.  Bad breath.  Decreased sense of smell and taste.  A cough, which worsens when you are lying flat.  Fatigue.  Fever.  Thick drainage from your nose, which often is green and may contain pus (purulent).  Swelling and warmth over the affected  sinuses. DIAGNOSIS Your health care provider will perform a physical exam. During your exam, your health care provider may perform any of the following to help determine if you have acute sinusitis or chronic sinusitis:  Look in your nose for signs of abnormal growths in your nostrils (nasal polyps).  Tap over the affected sinus to check for signs of infection.  View the inside of your sinuses using an imaging device that has a light attached (endoscope). If your health care provider suspects that you have chronic sinusitis, one or more of the following tests may be recommended:  Allergy tests.  Nasal culture. A sample of mucus is taken from your nose, sent to a lab, and screened for bacteria.  Nasal cytology. A sample of mucus is taken from your nose and examined by your health care provider to determine if your sinusitis is related to an allergy. TREATMENT Most cases of acute sinusitis are related to a viral infection and will resolve on their own within 10 days. Sometimes, medicines are prescribed to help relieve symptoms of both acute and chronic sinusitis. These may include pain medicines, decongestants, nasal steroid sprays, or saline sprays. However, for sinusitis related to a bacterial infection, your health care provider will prescribe antibiotic medicines. These are medicines that will help kill the bacteria causing the infection. Rarely, sinusitis is caused by a fungal infection. In these cases, your health care provider will prescribe antifungal medicine. For some cases of chronic sinusitis, surgery is needed. Generally, these are cases in which sinusitis recurs more than 3 times per year, despite other treatments. HOME CARE INSTRUCTIONS  Drink plenty of water. Water helps thin  the mucus so your sinuses can drain more easily.  Use a humidifier.  Inhale steam 3-4 times a day (for example, sit in the bathroom with the shower running).  Apply a warm, moist washcloth to your face  3-4 times a day, or as directed by your health care provider.  Use saline nasal sprays to help moisten and clean your sinuses.  Take medicines only as directed by your health care provider.  If you were prescribed either an antibiotic or antifungal medicine, finish it all even if you start to feel better. SEEK IMMEDIATE MEDICAL CARE IF:  You have increasing pain or severe headaches.  You have nausea, vomiting, or drowsiness.  You have swelling around your face.  You have vision problems.  You have a stiff neck.  You have difficulty breathing.   This information is not intended to replace advice given to you by your health care provider. Make sure you discuss any questions you have with your health care provider.   Document Released: 07/02/2005 Document Revised: 07/23/2014 Document Reviewed: 07/17/2011 Elsevier Interactive Patient Education [employer].
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
## 2015-09-21 ENCOUNTER — Other Ambulatory Visit (INDEPENDENT_AMBULATORY_CARE_PROVIDER_SITE_OTHER): Payer: PPO | Admitting: *Deleted

## 2015-09-21 ENCOUNTER — Encounter: Payer: Self-pay | Admitting: Internal Medicine

## 2015-09-21 ENCOUNTER — Ambulatory Visit (INDEPENDENT_AMBULATORY_CARE_PROVIDER_SITE_OTHER): Payer: PPO | Admitting: Internal Medicine

## 2015-09-21 VITALS — BP 108/62 | HR 105 | Temp 97.7°F | Resp 12 | Wt 245.0 lb

## 2015-09-21 DIAGNOSIS — E1065 Type 1 diabetes mellitus with hyperglycemia: Secondary | ICD-10-CM | POA: Diagnosis not present

## 2015-09-21 LAB — POCT GLYCOSYLATED HEMOGLOBIN (HGB A1C): HEMOGLOBIN A1C: 10

## 2015-09-21 NOTE — Progress Notes (Signed)
Patient ID: Kristie [name], female   DOB: 09/27/1965, 50 y.o.   MRN: [id number]  HPI: Kristie [name] is a 50 y.o.-year-old female, returning for follow-up for DM1, dx 2001 (GDM), uncontrolled, without complications. Last visit 3 mo ago.  She had the flu since last visit. Now on ABx.   She is still having pbs affording Novolog (126 $/mo).  Last hemoglobin A1c was: Lab Results  Component Value Date   HGBA1C 10.0 09/21/2015   HGBA1C 9.3 06/23/2015   HGBA1C 9.1 03/24/2015   Pt is on an insulin pump: Medtronic 723 x 1 mo, without CGM, uses Novolog in the pump.  Pump settings: - basal rates:  12 am: 1.7 6 am: 1.8  11 am: 1.8 - ICR:  12 am: 7.0  11 am: 7.0 >> 6 5 pm: 7.0 >> 6 - target: 100-120 - ISF: 20 >> 25 - Insulin on Board: 4h - Insulin on Board: 4h- extended bolusing: not using - changes infusion site: q3-4days - Meter: Bayer Contour Basal insulin tdd 61% (42.1 units) Bolus insulin tdd 39% (27 units)  Pt checks her sugars 2.5 a day and they are more variable- ave 217 +/-68 >> 250 +/-77 >> 185 +/- 91 >> 201 +/- 77 >> 215 +/- 93:  Previously:   - am: 133-293 >> 97, 197-251, 296 >> 129-251, >400 >> 63-256 >> 96-156, 276 >> 126->400 - 2h after b'fast: 150-395 >> 65, 129-181 >> 108-209 >> 72-258 >> 148-245 >> 132-245 - before lunch: 165-240 >> 128-266 >> 157-267 >> 100-241,>400 >> 142-266 >> 167-241 - 2h after lunch: 109-337 >> 80, 201-273 >> 171-386 >> 49, 88-383 >> 127-206, 366, >400 x1 >> n/c - before dinner: 133-275 >> 78, 172-289 >> 181-296 >> 94-226, 349 >> 80, 99, 173-255, 345 >> 164-251, 384 - 2h after dinner: 127, 213-345 >> 96, 163-243 >> 157-370>> 71, 205-356 >> 177, 251 >> 55, 161->400 - bedtime: 90, 213, 301 >> 208-220 >> see above - nighttime: 261 >> x1 122 >> 211-334 >> 121 >> n/c >> 56, 378 He does  upload her sugars. No lows. Lowest sugar was 37 >> 65 >> 108 >> 49 >> 80 >> 55; she has hypoglycemia awareness at 60s. No previous hypoglycemia admission.    Highest sugar was HI >> 350 >> >400. Had 1x previous DKA admissions: admitted for 10 days (in 07/2013), 2x in 2016.  Pt's meals are: - Breakfast: 2 eggs, toast, cheese, but most of the times she skips - Lunch: sandwich - Dinner: backed chicken or porkchops - Snacks: yoghurt, fruit She is walking for exercise 2x a week, plans to restart when she is feeling better.  - no CKD, last BUN/creatinine:  Lab Results  Component Value Date   BUN 9 03/20/2015   CREATININE 0.65 03/20/2015  Not on an ACEI. - last set of lipids: Lab Results  Component Value Date   CHOL 169 03/24/2015   HDL 45.10 03/24/2015   LDLCALC 101* 03/24/2015   LDLDIRECT 167.1 04/02/2013   TRIG 115.0 03/24/2015   CHOLHDL 4 03/24/2015  Not on a statin. - last eye exam was in 07/2014. No DR.  - + numbness and tingling in her feet (mostly big toes)  Last TSH was normal: Lab Results  Component Value Date   TSH 1.278 12/04/2014   I reviewed pt's medications, allergies, PMH, social hx, family hx, and changes were documented in the history of present illness. Otherwise, unchanged from my initial visit note.  ROS: Constitutional: +  weight gain, no fatigue, + subjective hypothermia, no  dysuria Eyes: no blurry vision, no xerophthalmia ENT: no sore throat, no nodules palpated in throat, no dysphagia/odynophagia, no hoarseness Cardiovascular: no CP/SOB/no palpitations/no leg swelling Respiratory: + cough/no SOB Gastrointestinal:no N/V/D/C Musculoskeletal: no muscle/joint aches Skin: no rashes Neurological: no tremors/numbness/tingling/dizziness, + HA  PE: BP 108/62 mmHg  Pulse 105  Temp(Src) 97.7 F (36.5 C) (Oral)  Resp 12  Wt 245 lb (111.131 kg)  SpO2 96% Body mass index is 38.36 kg/(m^2).  Wt Readings from Last 3 Encounters:  09/21/15 245 lb (111.131 kg)  09/12/15 242 lb 8 oz (109.997 kg)  06/23/15 239 lb (108.41 kg)   Constitutional: overweight, in NAD Eyes: PERRLA, EOMI, no exophthalmos ENT: moist  mucous membranes, no thyromegaly, no cervical lymphadenopathy Cardiovascular: RRR, No MRG Respiratory: CTA B Gastrointestinal: abdomen soft, NT, ND, BS+ Musculoskeletal: no deformities, strength intact in all 4 Skin: moist, warm, no rashes Neurological: no tremor with outstretched hands, DTR normal in all 4  ASSESSMENT: 1. DM1, uncontrolled, without complications (?PN) - on pump Component     Latest Ref Rng 09/24/2013  Glucose, Fasting     70 - 99 mg/dL 121 (H)  C-Peptide     0.80 - 3.90 ng/mL <0.10 (L)  - Needs 4 vials of insulin a month   PLAN:  1. Patient with long-standing, uncontrolled DM1, on insulin pump therapy, with worsened control c/w last visit. She usually wakes up with better blood sugars which increase throughout the day. She is bolusing more than before, but still not enough. She still has days in which she only boluses once a day. She is now changing the infusion site mostly every 3-4 days.  - we again discussed about the importance to bolus with ALL meals and also the importance of a better diet - I suggested to increase basal rate in the pm and decrease ICRs, while increasing the ISF to prevent dropping her sugars too much after corrections of highs (which still happens): Patient Instructions  Please change the pump settings: - basal rates:  12 am: 1.7 6 am: 1.8  11 am: 1.8 >> 1.9 - ICR:  12 am: 7.0  5 am: 7.0 >> 6.0 11 am: 6.0 >> 5 5 pm: 6.0 >> 5 - target: 100-120 - ISF: 25 >> 30 - Insulin on Board: 4h  Please check sugars and bolus 15 min before every meal and also check at bedtime.  Please return in 3 months.   - continue checking sugars at different times of the day - check at least 4 times a day, rotating checks - she needs a new eye exam >> scheduled - check HbA1c >> HbA1c 10% (higher!) - no signs of other autoimmune disorders - reviewed latest TSH >> normal - Return to clinic in 3 mo with sugar log   - time spent with the patient: 40 min, of  which >50% was spent in reviewing her pump download, discussing her hypo- and hyper-glycemic episodes, reviewing her previous labs and pump settings and developing a plan to avoid hypo- an hyper-glycemia.

## 2015-09-21 NOTE — Patient Instructions (Signed)
Please change the pump settings: - basal rates:  12 am: 1.7 6 am: 1.8  11 am: 1.8 >> 1.9 - ICR:  12 am: 7.0  5 am: 7.0 >> 6.0 11 am: 6.0 >> 5 5 pm: 6.0 >> 5 - target: 100-120 - ISF: 25 >> 30 - Insulin on Board: 4h  Please check sugars and bolus 15 min before every meal and also check at bedtime.  Please return in 3 months.

## 2015-10-17 ENCOUNTER — Other Ambulatory Visit: Payer: Self-pay | Admitting: Family Medicine

## 2015-10-17 NOTE — Telephone Encounter (Signed)
RF request for clonazepam LOV: 03/24/15 Next ov: None Last written: 03/22/15 #120 w/ 5RF  Please advise. Thanks.

## 2015-10-18 ENCOUNTER — Other Ambulatory Visit: Payer: Self-pay | Admitting: Family Medicine

## 2015-10-19 ENCOUNTER — Other Ambulatory Visit: Payer: Self-pay

## 2015-10-19 DIAGNOSIS — Z1231 Encounter for screening mammogram for malignant neoplasm of breast: Secondary | ICD-10-CM

## 2015-10-19 NOTE — Telephone Encounter (Signed)
Rx faxed to [facility].

## 2015-11-09 ENCOUNTER — Ambulatory Visit
Admission: RE | Admit: 2015-11-09 | Discharge: 2015-11-09 | Disposition: A | Discharge status: Home / Self Care | Payer: PPO | Source: Ambulatory Visit

## 2015-11-09 DIAGNOSIS — Z1231 Encounter for screening mammogram for malignant neoplasm of breast: Secondary | ICD-10-CM | POA: Diagnosis not present

## 2015-11-18 DIAGNOSIS — E1065 Type 1 diabetes mellitus with hyperglycemia: Secondary | ICD-10-CM | POA: Diagnosis not present

## 2015-12-18 ENCOUNTER — Other Ambulatory Visit: Payer: Self-pay | Admitting: Family Medicine

## 2015-12-22 ENCOUNTER — Ambulatory Visit: Payer: PPO | Admitting: Internal Medicine

## 2016-01-15 ENCOUNTER — Other Ambulatory Visit: Payer: Self-pay | Admitting: Family Medicine

## 2016-01-16 NOTE — Telephone Encounter (Signed)
RF request for venlafaxine LOV: 03/24/15  Next ov: None - over due for f/u RCI  Last written: 12/19/15 #60 w/ 0RF  Rx sent for #60 w/ 0RF. Pt needs office visit for f/u [facility] for more refills.

## 2016-01-16 NOTE — Telephone Encounter (Signed)
Left message for pt to call back  °

## 2016-01-19 NOTE — Telephone Encounter (Signed)
Left message for pt to call back  °

## 2016-01-23 NOTE — Telephone Encounter (Signed)
Pt advised and voiced understanding.  Apt made for 01/30/16 at 8:30am.

## 2016-01-26 ENCOUNTER — Ambulatory Visit (INDEPENDENT_AMBULATORY_CARE_PROVIDER_SITE_OTHER): Payer: PPO | Admitting: Internal Medicine

## 2016-01-26 ENCOUNTER — Encounter: Payer: Self-pay | Admitting: Internal Medicine

## 2016-01-26 VITALS — BP 118/82 | HR 70 | Ht 68.0 in | Wt 245.0 lb

## 2016-01-26 DIAGNOSIS — E1065 Type 1 diabetes mellitus with hyperglycemia: Secondary | ICD-10-CM

## 2016-01-26 LAB — POCT GLYCOSYLATED HEMOGLOBIN (HGB A1C): HEMOGLOBIN A1C: 9.9

## 2016-01-26 MED ORDER — INSULIN ASPART 100 UNIT/ML ~~LOC~~ SOLN: SUBCUTANEOUS | Status: DC

## 2016-01-26 NOTE — Progress Notes (Signed)
Patient ID: [name], female   DOB: 07-22-1965, 50 y.o.   MRN: [id number]  HPI: Kristie [name] is a 50 y.o.-year-old female, returning for follow-up for DM1, dx 2001 (GDM), uncontrolled, without complications. Last visit 4 mo ago.  She is having a lot of problems with her family's health: sister-in-law diagnosed with metastatic breast cancer at 50 years old, father-in-law recently died.  Last hemoglobin A1c was: Lab Results  Component Value Date   HGBA1C 10.0 09/21/2015   HGBA1C 9.3 06/23/2015   HGBA1C 9.1 03/24/2015   Pt is on an insulin pump: Medtronic 723 x 1.5 years, without CGM, uses Novolog in the pump.  Pump settings: - basal rates:  12 am: 1.7 6 am: 1.8  11 am: 1.8 >> 1.9 - ICR:  12 am: 7.0  5 am: 7.0 >> 6.0 11 am: 6.0 >> 5 5 pm: 6.0 >> 5 - target: 100-120 - ISF: 25 >> 30 - IOB: 4h - extended bolusing: not using - changes infusion site: q3-4days - Meter: Bayer Contour Basal insulin tdd 61% >> 56% (43.8 units) Bolus insulin tdd 39% >> 44% (34.8 units)  Pt checks her sugars 2.5 a day and they are more variable- ave 201 +/- 77 >> 215 +/- 93 >> 202 +/- 80:  Prev:   - am: 133-293 >> 97, 197-251, 296 >> 129-251, >400 >> 63-256 >> 96-156, 276 >> 126->400 >> 78-290 - 2h after b'fast: 150-395 >> 65, 129-181 >> 108-209 >> 72-258 >> 148-245 >> 132-245 >> n/c - before lunch: 165-240 >> 128-266 >> 157-267 >> 100-241,>400 >> 142-266 >> 167-241 >> 65, 89-208 - 2h after lunch: 109-337 >> 80, 201-273 >> 171-386 >> 49, 88-383 >> 127-206, 366, >400 x1 >> n/c - before dinner: 133-275 >> 78, 172-289 >> 181-296 >> 94-226, 349 >> 80, 99, 173-255, 345 >> 164-251, 384 >> 193-241 - 2h after dinner: 127, 213-345 >> 96, 163-243 >> 157-370>> 71, 205-356 >> 177, 251 >> 55, 161->400 >> 206- 287 - bedtime: 90, 213, 301 >> 208-220 >> see above - nighttime: 261 >> x1 122 >> 211-334 >> 121 >> n/c >> 56, 378 >> 400x1 No lows. Lowest sugar was 37 >> 65 >> 108 >> 49 >> 80 >> 55 >> 65; she has  hypoglycemia awareness at 60s. No previous hypoglycemia admission.  Highest sugar was HI >> 350 >> >400 >> 400. Had 1x previous DKA admissions: admitted for 10 days (in 07/2013), 2x in 2016.  Pt's meals are: - Breakfast: 2 eggs, toast, cheese, but most of the times she skips - Lunch: sandwich - Dinner: backed chicken or porkchops - Snacks: yoghurt, fruit She is walking for exercise almost every day for 30 minutes.  She is still having pbs affording Novolog (126 $/mo).  - no CKD, last BUN/creatinine:  Lab Results  Component Value Date   BUN 9 03/20/2015   CREATININE 0.65 03/20/2015  Not on an ACEI. - last set of lipids: Lab Results  Component Value Date   CHOL 169 03/24/2015   HDL 45.10 03/24/2015   LDLCALC 101* 03/24/2015   LDLDIRECT 167.1 04/02/2013   TRIG 115.0 03/24/2015   CHOLHDL 4 03/24/2015  Not on a statin. - last eye exam was in 07/2014. No DR.  - + numbness and tingling in her feet (mostly big toes)  Last TSH was normal: Lab Results  Component Value Date   TSH 1.278 12/04/2014   I reviewed pt's medications, allergies, PMH, social hx, family hx, and changes were  documented in the history of present illness. Otherwise, unchanged from my initial visit note.  ROS: Constitutional: No weight gain, no fatigue, No subjective hypothermia, no  dysuria Eyes: no blurry vision, no xerophthalmia ENT: no sore throat, no nodules palpated in throat, no dysphagia/odynophagia, no hoarseness Cardiovascular: no CP/SOB/no palpitations/no leg swelling Respiratory: No cough/no SOB Gastrointestinal:no N/V/D/C Musculoskeletal: no muscle/joint aches Skin: no rashes Neurological: no tremors/numbness/tingling/dizziness, + HA  PE: BP 118/82 mmHg  Pulse 70  Ht 5\' 8"  (1.727 m)  Wt 245 lb (111.131 kg)  BMI 37.26 kg/m2 Body mass index is 37.26 kg/(m^2).  Wt Readings from Last 3 Encounters:  01/26/16 245 lb (111.131 kg)  09/21/15 245 lb (111.131 kg)  09/12/15 242 lb 8 oz (109.997 kg)    Constitutional: overweight, in NAD Eyes: PERRLA, EOMI, no exophthalmos ENT: moist mucous membranes, no thyromegaly, no cervical lymphadenopathy Cardiovascular: RRR, No MRG Respiratory: CTA B Gastrointestinal: abdomen soft, NT, ND, BS+ Musculoskeletal: no deformities, strength intact in all 4 Skin: moist, warm, no rashes Neurological: no tremor with outstretched hands, DTR normal in all 4  ASSESSMENT: 1. DM1, uncontrolled, without complications (?PN) - on pump Component     Latest Ref Rng 09/24/2013  Glucose, Fasting     70 - 99 mg/dL 121 (H)  C-Peptide     0.80 - 3.90 ng/mL <0.10 (L)  - Needs 4 vials of insulin a month   PLAN:  1. Patient with long-standing, uncontrolled DM1, on insulin pump therapy, with persistent poor control. She Is not checking sugars or bolusing before every meal. She still has days in which she only boluses once a day. She is now changing the infusion site mostly every 3 days.  - we again discussed about the importance to bolus with ALL meals  - I noticed that she does a lot of 20 unit boluses, and we discussed about the way to increase the bolus size, if needed - We will not change her pump settings at this time, but work on compliance: Patient Instructions  Please have your PCP check your TSH along with the metabolic panel and cholesterol level.  Please continue: - basal rates:  12 am: 1.7 6 am: 1.8  11 am: 1.9 - ICR:  12 am: 7.0  5 am: 6.0 11 am: 5.0 5 pm: 5.0 - target: 100-120 - ISF: 30 - Active insulin time: 4h  Please check sugars and bolus before every meal.  Please return in 3 months with your sugar log.   - continue checking sugars at different times of the day - check at least 4 times a day, rotating checks - she needs a new eye exam - check HbA1c >> HbA1c 9.9% (same) - no signs of other autoimmune disorders - reviewed latest TSH >> normal - Return to clinic in 3 mo with sugar log   - time spent with the patient: 40 min, of  which >50% was spent in reviewing her pump download, discussing her hypo- and hyper-glycemic episodes, reviewing her previous labs and pump settings and developing a plan to avoid hypo- an hyper-glycemia.

## 2016-01-26 NOTE — Patient Instructions (Addendum)
Please have your PCP check your TSH along with the metabolic panel and cholesterol level.  Please continue: - basal rates:  12 am: 1.7 6 am: 1.8  11 am: 1.9 - ICR:  12 am: 7.0  5 am: 6.0 11 am: 5.0 5 pm: 5.0 - target: 100-120 - ISF: 30 - Active insulin time: 4h  Please check sugars and bolus before every meal.  Please return in 3 months with your sugar log.

## 2016-01-30 ENCOUNTER — Emergency Department (HOSPITAL_BASED_OUTPATIENT_CLINIC_OR_DEPARTMENT_OTHER): Payer: PPO

## 2016-01-30 ENCOUNTER — Ambulatory Visit (INDEPENDENT_AMBULATORY_CARE_PROVIDER_SITE_OTHER): Payer: PPO | Admitting: Family Medicine

## 2016-01-30 ENCOUNTER — Emergency Department (HOSPITAL_BASED_OUTPATIENT_CLINIC_OR_DEPARTMENT_OTHER)
Admission: EM | Admit: 2016-01-30 | Discharge: 2016-01-30 | Disposition: A | Discharge status: Home / Self Care | Payer: PPO | Attending: Emergency Medicine | Admitting: Emergency Medicine

## 2016-01-30 ENCOUNTER — Encounter (HOSPITAL_BASED_OUTPATIENT_CLINIC_OR_DEPARTMENT_OTHER): Payer: Self-pay | Admitting: *Deleted

## 2016-01-30 ENCOUNTER — Encounter: Payer: Self-pay | Admitting: Family Medicine

## 2016-01-30 ENCOUNTER — Telehealth: Payer: Self-pay | Admitting: Family Medicine

## 2016-01-30 VITALS — BP 125/85 | HR 85 | Temp 97.6°F | Resp 16 | Ht 68.0 in | Wt 241.2 lb

## 2016-01-30 DIAGNOSIS — R079 Chest pain, unspecified: Secondary | ICD-10-CM | POA: Diagnosis not present

## 2016-01-30 DIAGNOSIS — D509 Iron deficiency anemia, unspecified: Secondary | ICD-10-CM | POA: Diagnosis not present

## 2016-01-30 DIAGNOSIS — M542 Cervicalgia: Secondary | ICD-10-CM | POA: Insufficient documentation

## 2016-01-30 DIAGNOSIS — E785 Hyperlipidemia, unspecified: Secondary | ICD-10-CM | POA: Diagnosis not present

## 2016-01-30 DIAGNOSIS — R300 Dysuria: Secondary | ICD-10-CM | POA: Insufficient documentation

## 2016-01-30 DIAGNOSIS — F329 Major depressive disorder, single episode, unspecified: Secondary | ICD-10-CM | POA: Diagnosis not present

## 2016-01-30 DIAGNOSIS — R739 Hyperglycemia, unspecified: Secondary | ICD-10-CM

## 2016-01-30 DIAGNOSIS — R0789 Other chest pain: Secondary | ICD-10-CM | POA: Diagnosis not present

## 2016-01-30 DIAGNOSIS — E1165 Type 2 diabetes mellitus with hyperglycemia: Secondary | ICD-10-CM | POA: Insufficient documentation

## 2016-01-30 DIAGNOSIS — R000 Tachycardia, unspecified: Secondary | ICD-10-CM | POA: Diagnosis not present

## 2016-01-30 DIAGNOSIS — Z794 Long term (current) use of insulin: Secondary | ICD-10-CM | POA: Insufficient documentation

## 2016-01-30 DIAGNOSIS — F411 Generalized anxiety disorder: Secondary | ICD-10-CM | POA: Diagnosis not present

## 2016-01-30 DIAGNOSIS — R799 Abnormal finding of blood chemistry, unspecified: Secondary | ICD-10-CM | POA: Diagnosis not present

## 2016-01-30 DIAGNOSIS — F32A Depression, unspecified: Secondary | ICD-10-CM

## 2016-01-30 LAB — URINALYSIS, ROUTINE W REFLEX MICROSCOPIC
BILIRUBIN URINE: NEGATIVE
Ketones, ur: 15 mg/dL — AB
Nitrite: NEGATIVE
Protein, ur: NEGATIVE mg/dL
SPECIFIC GRAVITY, URINE: 1.03 (ref 1.005–1.030)
pH: 5.5 (ref 5.0–8.0)

## 2016-01-30 LAB — COMPREHENSIVE METABOLIC PANEL
ALBUMIN: 4.2 g/dL (ref 3.5–5.2)
ALK PHOS: 175 U/L — AB (ref 39–117)
ALT: 37 U/L — ABNORMAL HIGH (ref 0–35)
AST: 16 U/L (ref 0–37)
BUN: 15 mg/dL (ref 6–23)
CO2: 25 mEq/L (ref 19–32)
CREATININE: 1.01 mg/dL (ref 0.40–1.20)
Calcium: 9.6 mg/dL (ref 8.4–10.5)
Chloride: 93 mEq/L — ABNORMAL LOW (ref 96–112)
GFR: 61.63 mL/min (ref >60.00)
GLUCOSE: 555 mg/dL — AB (ref 70–99)
Potassium: 6.1 mEq/L (ref 3.5–5.1)
SODIUM: 127 meq/L — AB (ref 135–145)
TOTAL PROTEIN: 7.2 g/dL (ref 6.0–8.3)
Total Bilirubin: 1.8 mg/dL — ABNORMAL HIGH (ref 0.2–1.2)

## 2016-01-30 LAB — CBC WITH DIFFERENTIAL/PLATELET
BASOS ABS: 0 10*3/uL (ref 0.0–0.1)
Basophils Relative: 0 %
EOS ABS: 0 10*3/uL (ref 0.0–0.7)
EOS PCT: 1 %
HCT: 43 % (ref 36.0–46.0)
HEMOGLOBIN: 14.8 g/dL (ref 12.0–15.0)
LYMPHS ABS: 2 10*3/uL (ref 0.7–4.0)
Lymphocytes Relative: 27 %
MCH: 29.5 pg (ref 26.0–34.0)
MCHC: 34.4 g/dL (ref 30.0–36.0)
MCV: 85.8 fL (ref 78.0–100.0)
Monocytes Absolute: 0.5 10*3/uL (ref 0.1–1.0)
Monocytes Relative: 6 %
NEUTROS PCT: 66 %
Neutro Abs: 4.9 10*3/uL (ref 1.7–7.7)
PLATELETS: 298 10*3/uL (ref 150–400)
RBC: 5.01 MIL/uL (ref 3.87–5.11)
RDW: 12.8 % (ref 11.5–15.5)
WBC: 7.4 10*3/uL (ref 4.0–10.5)

## 2016-01-30 LAB — BASIC METABOLIC PANEL
ANION GAP: 10 (ref 5–15)
BUN: 22 mg/dL — AB (ref 6–20)
CHLORIDE: 98 mmol/L — AB (ref 101–111)
CO2: 24 mmol/L (ref 22–32)
CREATININE: 1.21 mg/dL — AB (ref 0.44–1.00)
Calcium: 9.6 mg/dL (ref 8.9–10.3)
GFR, EST AFRICAN AMERICAN: 59 mL/min — AB (ref >60)
GFR, EST NON AFRICAN AMERICAN: 51 mL/min — AB (ref >60)
Glucose, Bld: 360 mg/dL — ABNORMAL HIGH (ref 65–99)
POTASSIUM: 3.9 mmol/L (ref 3.5–5.1)
SODIUM: 132 mmol/L — AB (ref 135–145)

## 2016-01-30 LAB — LIPID PANEL
CHOLESTEROL: 247 mg/dL — AB (ref 0–200)
HDL: 50.5 mg/dL (ref >39.00)
LDL CALC: 176 mg/dL — AB (ref 0–99)
NONHDL: 196.39
Total CHOL/HDL Ratio: 5
Triglycerides: 103 mg/dL (ref 0.0–149.0)
VLDL: 20.6 mg/dL (ref 0.0–40.0)

## 2016-01-30 LAB — URINE MICROSCOPIC-ADD ON

## 2016-01-30 LAB — CBG MONITORING, ED: Glucose-Capillary: 126 mg/dL — ABNORMAL HIGH (ref 65–99)

## 2016-01-30 LAB — TROPONIN I

## 2016-01-30 LAB — MAGNESIUM: Magnesium: 2 mg/dL (ref 1.7–2.4)

## 2016-01-30 MED ORDER — SODIUM CHLORIDE 0.9 % IV BOLUS (SEPSIS)
1000.0000 mL | Freq: Once | INTRAVENOUS | Status: AC
  Administered 2016-01-30: 1000 mL via INTRAVENOUS

## 2016-01-30 MED ORDER — VENLAFAXINE HCL ER 75 MG PO CP24: 75.0000 mg | ORAL_CAPSULE | Freq: Every day | ORAL | Status: DC

## 2016-01-30 MED ORDER — GLUCOSE BLOOD VI STRP: ORAL_STRIP | Status: DC

## 2016-01-30 MED ORDER — VENLAFAXINE HCL ER 150 MG PO CP24: ORAL_CAPSULE | ORAL | Status: DC

## 2016-01-30 NOTE — ED Notes (Addendum)
She went to see her MD for a check up. He called her tonight with an abnormal elevation of Potassium. She was told to come to the ED. She admits to chest pain x 4 days that she did not mention. She has a stiff neck that she woke up with.
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
## 2016-01-30 NOTE — Telephone Encounter (Signed)
Received critial results by phone: Potassium 6.1, glucose 555. I called pt and recommended she go to the nearest ED for further evaluation and correction of her potassium level.  She expressed understanding and said she would go to [place].
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
## 2016-01-30 NOTE — Progress Notes (Signed)
Pre visit review using our clinic review tool, if applicable. No additional management support is needed unless otherwise documented below in the visit note. 

## 2016-01-30 NOTE — Discharge Instructions (Signed)
Hyperglycemia °Hyperglycemia occurs when the glucose (sugar) in your blood is too high. Hyperglycemia can happen for many reasons, but it most often happens to people who do not know they have diabetes or are not managing their diabetes properly.  °CAUSES  °Whether you have diabetes or not, there are other causes of hyperglycemia. Hyperglycemia can occur when you have diabetes, but it can also occur in other situations that you might not be as aware of, such as: °Diabetes °· If you have diabetes and are having problems controlling your blood glucose, hyperglycemia could occur because of some of the following reasons: °¨ Not following your meal plan. °¨ Not taking your diabetes medications or not taking it properly. °¨ Exercising less or doing less activity than you normally do. °¨ Being sick. °Pre-diabetes °· This cannot be ignored. Before people develop Type 2 diabetes, they almost always have "pre-diabetes." This is when your blood glucose levels are higher than normal, but not yet high enough to be diagnosed as diabetes. Research has shown that some long-term damage to the body, especially the heart and circulatory system, may already be occurring during pre-diabetes. If you take action to manage your blood glucose when you have pre-diabetes, you may delay or prevent Type 2 diabetes from developing. °Stress °· If you have diabetes, you may be "diet" controlled or on oral medications or insulin to control your diabetes. However, you may find that your blood glucose is higher than usual in the hospital whether you have diabetes or not. This is often referred to as "stress hyperglycemia." Stress can elevate your blood glucose. This happens because of hormones put out by the body during times of stress. If stress has been the cause of your high blood glucose, it can be followed regularly by your caregiver. That way he/she can make sure your hyperglycemia does not continue to get worse or progress to  diabetes. °Steroids °· Steroids are medications that act on the infection fighting system (immune system) to block inflammation or infection. One side effect can be a rise in blood glucose. Most people can produce enough extra insulin to allow for this rise, but for those who cannot, steroids make blood glucose levels go even higher. It is not unusual for steroid treatments to "uncover" diabetes that is developing. It is not always possible to determine if the hyperglycemia will go away after the steroids are stopped. A special blood test called an A1c is sometimes done to determine if your blood glucose was elevated before the steroids were started. °SYMPTOMS °· Thirsty. °· Frequent urination. °· Dry mouth. °· Blurred vision. °· Tired or fatigue. °· Weakness. °· Sleepy. °· Tingling in feet or leg. °DIAGNOSIS  °Diagnosis is made by monitoring blood glucose in one or all of the following ways: °· A1c test. This is a chemical found in your blood. °· Fingerstick blood glucose monitoring. °· Laboratory results. °TREATMENT  °First, knowing the cause of the hyperglycemia is important before the hyperglycemia can be treated. Treatment may include, but is not be limited to: °· Education. °· Change or adjustment in medications. °· Change or adjustment in meal plan. °· Treatment for an illness, infection, etc. °· More frequent blood glucose monitoring. °· Change in exercise plan. °· Decreasing or stopping steroids. °· Lifestyle changes. °HOME CARE INSTRUCTIONS  °· Test your blood glucose as directed. °· Exercise regularly. Your caregiver will give you instructions about exercise. Pre-diabetes or diabetes which comes on with stress is helped by exercising. °· Eat wholesome,   balanced meals. Eat often and at regular, fixed times. Your caregiver or nutritionist will give you a meal plan to guide your sugar intake. °· Being at an ideal weight is important. If needed, losing as little as 10 to 15 pounds may help improve blood  glucose levels. °SEEK MEDICAL CARE IF:  °· You have questions about medicine, activity, or diet. °· You continue to have symptoms (problems such as increased thirst, urination, or weight gain). °SEEK IMMEDIATE MEDICAL CARE IF:  °· You are vomiting or have diarrhea. °· Your breath smells fruity. °· You are breathing faster or slower. °· You are very sleepy or incoherent. °· You have numbness, tingling, or pain in your feet or hands. °· You have chest pain. °· Your symptoms get worse even though you have been following your caregiver's orders. °· If you have any other questions or concerns. °  °This information is not intended to replace advice given to you by your health care provider. Make sure you discuss any questions you have with your health care provider. °  °Document Released: 12/26/2000 Document Revised: 09/24/2011 Document Reviewed: 03/08/2015 °Elsevier Interactive Patient Education ©2016 [employer] ° °

## 2016-01-31 LAB — CBC WITH DIFFERENTIAL/PLATELET
BASOS PCT: 0.3 % (ref 0.0–3.0)
Basophils Absolute: 0 10*3/uL (ref 0.0–0.1)
EOS ABS: 0.1 10*3/uL (ref 0.0–0.7)
EOS PCT: 1 % (ref 0.0–5.0)
HEMATOCRIT: 45.4 % (ref 36.0–46.0)
Hemoglobin: 15.1 g/dL — ABNORMAL HIGH (ref 12.0–15.0)
LYMPHS PCT: 15.6 % (ref 12.0–46.0)
Lymphs Abs: 1.5 10*3/uL (ref 0.7–4.0)
MCHC: 33.2 g/dL (ref 30.0–36.0)
MCV: 87.3 fl (ref 78.0–100.0)
Monocytes Absolute: 0.7 10*3/uL (ref 0.1–1.0)
Monocytes Relative: 7.4 % (ref 3.0–12.0)
NEUTROS ABS: 7.1 10*3/uL (ref 1.4–7.7)
Neutrophils Relative %: 75.7 % (ref 43.0–77.0)
PLATELETS: 273 10*3/uL (ref 150.0–400.0)
RBC: 5.2 Mil/uL — ABNORMAL HIGH (ref 3.87–5.11)
RDW: 13.7 % (ref 11.5–15.5)
WBC: 9.4 10*3/uL (ref 4.0–10.5)

## 2016-02-01 LAB — URINE CULTURE

## 2016-04-04 ENCOUNTER — Telehealth: Payer: Self-pay | Admitting: Family Medicine

## 2016-04-04 NOTE — Telephone Encounter (Signed)
She has to see her endocrinologist.

## 2016-04-04 NOTE — Telephone Encounter (Signed)
Patient feels her A1C has come down. She  has paperwork from the [facility] that needs to be filled out. Can she come to our office & have her blood tested for the paperwork or should she see her endocrinologist.

## 2016-04-05 NOTE — Telephone Encounter (Signed)
Patient aware.

## 2016-04-27 ENCOUNTER — Ambulatory Visit (INDEPENDENT_AMBULATORY_CARE_PROVIDER_SITE_OTHER): Payer: PPO | Admitting: Internal Medicine

## 2016-04-27 ENCOUNTER — Other Ambulatory Visit: Payer: Self-pay | Admitting: Family Medicine

## 2016-04-27 ENCOUNTER — Encounter: Payer: Self-pay | Admitting: Internal Medicine

## 2016-04-27 VITALS — BP 108/70 | HR 98 | Ht 68.0 in | Wt 248.0 lb

## 2016-04-27 DIAGNOSIS — E1065 Type 1 diabetes mellitus with hyperglycemia: Secondary | ICD-10-CM

## 2016-04-27 LAB — TSH: TSH: 1.12 u[IU]/mL (ref 0.35–4.50)

## 2016-04-27 LAB — POCT GLYCOSYLATED HEMOGLOBIN (HGB A1C): HEMOGLOBIN A1C: 9.6

## 2016-04-27 MED ORDER — INSULIN REGULAR HUMAN 100 UNIT/ML IJ SOLN: INTRAMUSCULAR | 3 refills | Status: DC

## 2016-04-27 NOTE — Patient Instructions (Addendum)
Please stop Novolog and start Novolin R and change the pump settings: - basal rates:  12 am: 1.7 >> 1.8 6 am: 1.8   11 am: 1.9 >> 2.0 - ICR:  12 am: 7.0  5 am: 6.0 >> 5.0 11 am: 5.0 5 pm: 5.0 - target: 100-120 >> 100-110 - ISF: 30 - Active insulin time: 4h  Please check sugars and bolus before every meal.  Please stop at the lab.  Please return in 3 months with your sugar log.

## 2016-04-27 NOTE — Telephone Encounter (Signed)
Rx faxed

## 2016-04-27 NOTE — Progress Notes (Signed)
Patient ID: Kristie [name], female   DOB: 1966-03-25, 50 y.o.   MRN: [id number]  HPI: Kristie [name] is a 50 y.o.-year-old female, returning for follow-up for DM1, dx 2001 (GDM), uncontrolled, without long term complications. Last visit 3 mo ago.   She lost her sister in law this summer. She had metastatic BrCA. She was 50 y/o.  She was in the ED with [facility] 01/2016 >> CBG 555. No DKA.   She started to walk more. Sugars were better 2 mo ago as she was eating beets. She will restart.  Last hemoglobin A1c was: Lab Results  Component Value Date   HGBA1C 9.9 01/26/2016   HGBA1C 10.0 09/21/2015   HGBA1C 9.3 06/23/2015   Pt is on an insulin pump: Medtronic 723 x 2 years, without CGM, uses Novolog in the pump.  Pump settings: - basal rates:  12 am: 1.7 6 am: 1.8  11 am: 1.9 - ICR:  12 am: 7.0  5 am: 6.0 11 am: 5.0 5 pm: 5.0 - target: 100-120 - ISF: 30 - Active insulin time: 4h - extended bolusing: not using - changes infusion site: q3-4days - Meter: Bayer Contour Basal insulin tdd 44%  Bolus insulin tdd 56%  Pt checks her sugars 3.1x a day and they are >> 202 +/- 80 >> 216 +/- 73 - will scan reports. - am: 97, 197-251, 296 >> 129-251, >400 >> 63-256 >> 96-156, 276 >> 126->400 >> 78-290  >> 128-288, 340 - 2h after b'fast: 150-395 >> 65, 129-181 >> 108-209 >> 72-258 >> 148-245 >> 132-245 >> n/c  - before lunch: 128-266 >> 157-267 >> 100-241,>400 >> 142-266 >> 167-241 >> 65, 89-208 >> 98-248, 272 - 2h after lunch: 109-337 >> 80, 201-273 >> 171-386 >> 49, 88-383 >> 127-206, 366, >400 x1 >> n/c >> 282, >400 - before dinner: 181-296 >> 94-226, 349 >> 80, 99, 173-255, 345 >> 164-251, 384 >> 193-241 >> 115-265 - 2h after dinner: 96, 163-243 >> 157-370>> 71, 205-356 >> 177, 251 >> 55, 161->400 >> 206- 287 >> 234,340 - bedtime: 90, 213, 301 >> 208-220 >> see above - nighttime: 261 >> x1 122 >> 211-334 >> 121 >> n/c >> 56, 378 >> 400x1 >> 221 No lows. Lowest sugar was 37 >> ... >> 65; she  has hypoglycemia awareness at 60s. No previous hypoglycemia admission.  Highest sugar was HI >> 350 >> >400.Marland Kitchen  Had 1x previous DKA admissions.  Pt's meals are: - Breakfast: 2 eggs, toast, cheese, but most of the times she skips - Lunch: sandwich - Dinner: backed chicken or porkchops - Snacks: yoghurt, fruit She is walking for exercise almost every day for 30 minutes.  She is still having pbs affording Novolog (126 $/mo).  - no CKD, last BUN/creatinine:  Lab Results  Component Value Date   BUN 22 (H) 01/30/2016   CREATININE 1.21 (H) 01/30/2016  Not on an ACEI. - last set of lipids: Lab Results  Component Value Date   CHOL 247 (H) 01/30/2016   HDL 50.50 01/30/2016   LDLCALC 176 (H) 01/30/2016   LDLDIRECT 167.1 04/02/2013   TRIG 103.0 01/30/2016   CHOLHDL 5 01/30/2016  Started Lipitor 40 in 01/2016. - last eye exam was in 07/2014. No DR.  - + numbness and tingling in her feet (mostly big toes)  Last TSH was normal: Lab Results  Component Value Date   TSH 1.278 12/04/2014   I reviewed pt's medications, allergies, PMH, social hx, family hx, and changes  were documented in the history of present illness. Otherwise, unchanged from my initial visit note.  ROS: Constitutional: No weight gain, no fatigue, No subjective hypothermia, + nocturia Eyes: + blurry vision, no xerophthalmia ENT: no sore throat, no nodules palpated in throat, no dysphagia/odynophagia, no hoarseness Cardiovascular: no CP/SOB/no palpitations/no leg swelling Respiratory: No cough/no SOB Gastrointestinal:no N/V/D/C Musculoskeletal: no muscle/joint aches Skin: no rashes Neurological: no tremors/numbness/tingling/dizziness, + HA  PE: BP 108/70   Pulse 98   Ht '5\' 8"'  (1.727 m)   Wt 248 lb (112.5 kg)   SpO2 97%   BMI 37.71 kg/m  Body mass index is 37.71 kg/m.  Wt Readings from Last 3 Encounters:  04/27/16 248 lb (112.5 kg)  01/30/16 245 lb (111.1 kg)  01/30/16 241 lb 4 oz (109.4 kg)    Constitutional: overweight, in NAD Eyes: PERRLA, EOMI, no exophthalmos ENT: moist mucous membranes, no thyromegaly, no cervical lymphadenopathy Cardiovascular: RRR, No MRG Respiratory: CTA B Gastrointestinal: abdomen soft, NT, ND, BS+ Musculoskeletal: no deformities, strength intact in all 4 Skin: moist, warm, no rashes Neurological: no tremor with outstretched hands, DTR normal in all 4  ASSESSMENT: 1. DM1, uncontrolled, without complications (?PN) - on pump Component     Latest Ref Rng 09/24/2013  Glucose, Fasting     70 - 99 mg/dL 121 (H)  C-Peptide     0.80 - 3.90 ng/mL <0.10 (L)  - Needs 4 vials of insulin a month   PLAN:  1. Patient with long-standing, uncontrolled DM1, on insulin pump therapy, with persistent poor control. She doing a better job checking sugars and bolusing before meals, but still does not bleed with every meal. She still has days in which she only boluses once a day. She is now changing the infusion site mostly every 3 days.  - we again discussed about the importance to bolus with ALL meals  - However, her sugars are uniformly high, therefore, I would like to increase her basal rates. Since sugars after breakfast are higher and continued to increase throughout the day, I will change the insulin to carb ratio with this meal. We will also decrease her target CBG. - I advised her to: Patient Instructions  Please stop Novolog and start Novolin R and change the pump settings: - basal rates:  12 am: 1.7 >> 1.8 6 am: 1.8   11 am: 1.9 >> 2.0 - ICR:  12 am: 7.0  5 am: 6.0 >> 5.0 11 am: 5.0 5 pm: 5.0 - target: 100-120 >> 100-110 - ISF: 30 - Active insulin time: 4h  Please check sugars and bolus before every meal.  Please stop at the lab.  Please return in 3 months with your sugar log.   - We will change to Novolin R as she cannot afford Novolog. I advised her to start the bolus 30 min before meals. - continue checking sugars at different times of the  day - check at least 4 times a day, rotating checks - she needs a new eye exam - check HbA1c >> 9.6% (slightly better) - no signs of other autoimmune disorders - reviewed latest TSH >> normal, 1.5 years ago >> will recheck one today - Return to clinic in 3 mo with sugar log   Office Visit on 04/27/2016  Component Date Value Ref Range Status  . TSH 04/27/2016 1.12  0.35 - 4.50 uIU/mL Final  . Hemoglobin A1C 04/27/2016 9.6   Final    - time spent with the patient: 40 min, of  which >50% was spent in reviewing her pump download, discussing her hypo- and hyper-glycemic episodes, reviewing her previous labs and pump settings and developing a plan to avoid hypo- an hyper-glycemia.

## 2016-04-27 NOTE — Telephone Encounter (Signed)
Rf request for clonazepam. Last OV was 01/30/16. No upcoming OV Last Rx 10/18/15 #120 x 5rfs.  Please advise.

## 2016-06-14 DIAGNOSIS — I10 Essential (primary) hypertension: Secondary | ICD-10-CM | POA: Diagnosis not present

## 2016-06-14 DIAGNOSIS — Z23 Encounter for immunization: Secondary | ICD-10-CM | POA: Diagnosis not present

## 2016-06-14 DIAGNOSIS — E7800 Pure hypercholesterolemia, unspecified: Secondary | ICD-10-CM | POA: Diagnosis not present

## 2016-06-14 DIAGNOSIS — E119 Type 2 diabetes mellitus without complications: Secondary | ICD-10-CM | POA: Diagnosis not present

## 2016-07-23 ENCOUNTER — Other Ambulatory Visit: Payer: Self-pay | Admitting: Internal Medicine

## 2016-07-23 ENCOUNTER — Other Ambulatory Visit: Payer: Self-pay | Admitting: Family Medicine

## 2016-07-31 ENCOUNTER — Ambulatory Visit: Payer: PPO | Admitting: Internal Medicine

## 2016-07-31 DIAGNOSIS — E1110 Type 2 diabetes mellitus with ketoacidosis without coma: Secondary | ICD-10-CM

## 2016-07-31 HISTORY — DX: Type 2 diabetes mellitus with ketoacidosis without coma: E11.10

## 2016-08-29 IMAGING — MG MM DIGITAL SCREENING BILAT
5 series · 5 of 5 positions shown · non-contrast
Comparison: Previous exam(s).

CLINICAL DATA: Screening.

EXAM:
DIGITAL SCREENING BILATERAL MAMMOGRAM WITH CAD

[R MLO]
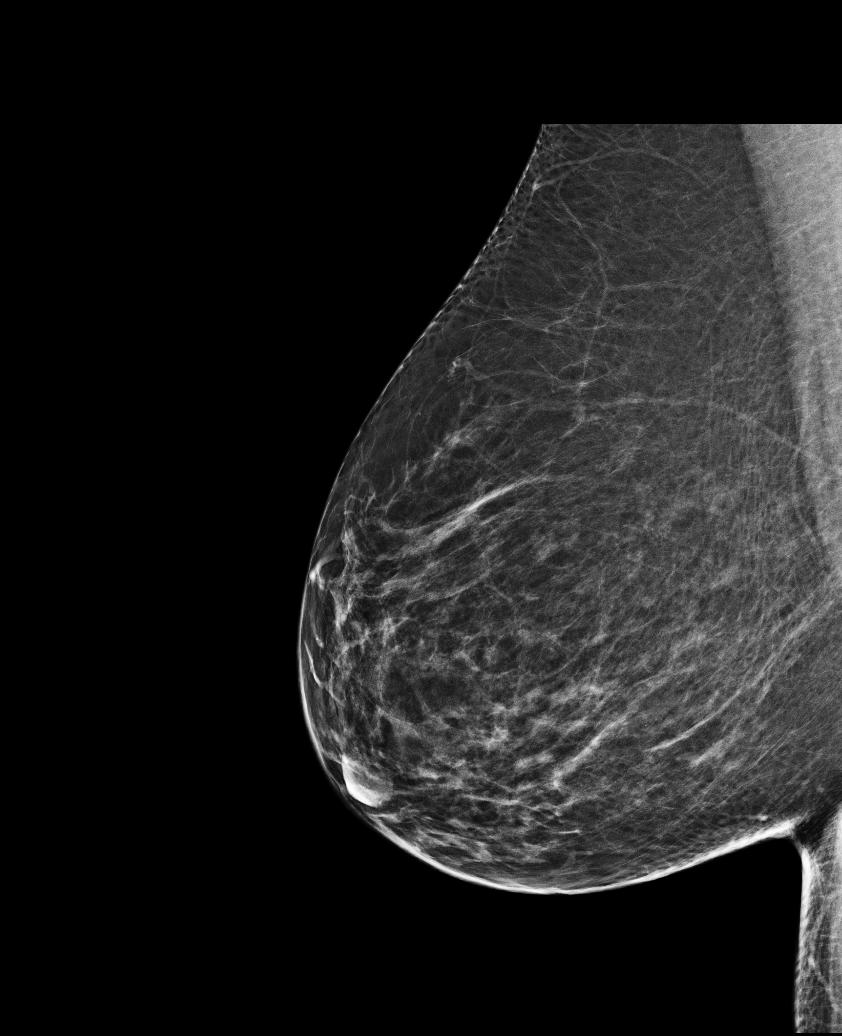

[R CC]
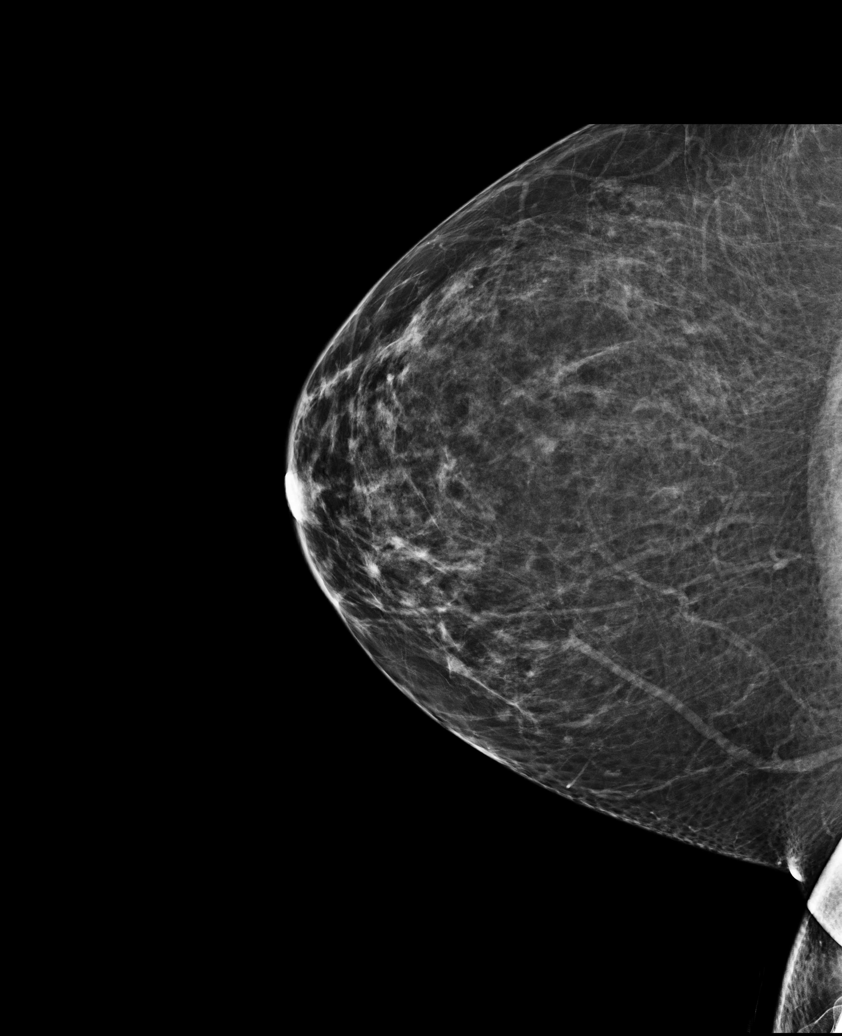

[L MLO (1 of 2)]
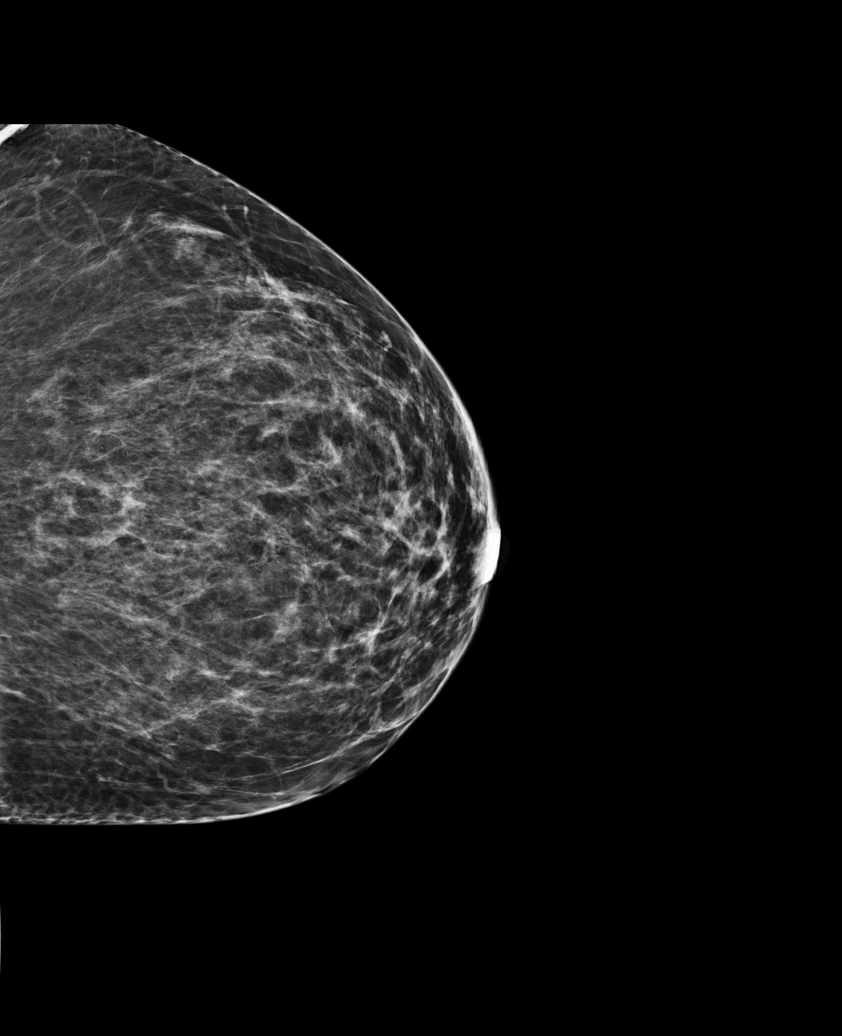

[L MLO (2 of 2)]
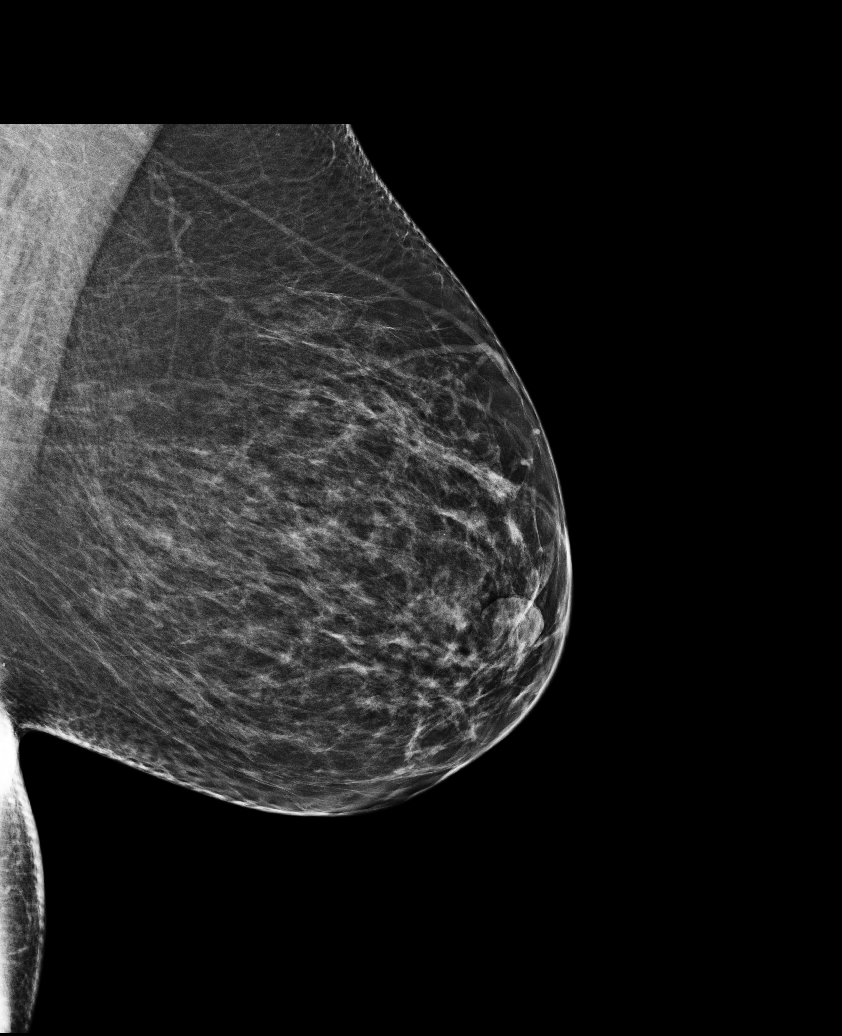

[L CC]
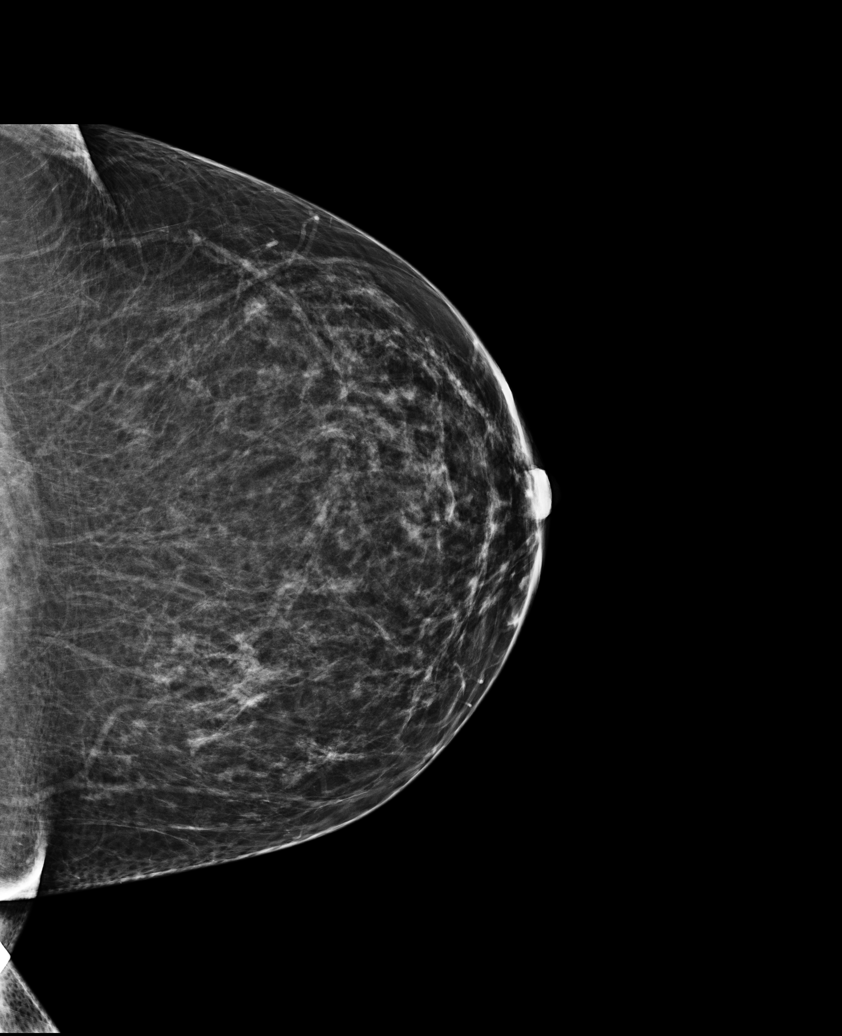

[5 of 5 positions shown; findings below may reference images not displayed]

ACR Breast Density Category b: There are scattered areas of
fibroglandular density.
FINDINGS: There are no findings suspicious for malignancy. Images were
processed with CAD.
IMPRESSION: No mammographic evidence of malignancy. A result letter of this
screening mammogram will be mailed directly to the patient.

RECOMMENDATION:
Screening mammogram in one year. (Code:AS-G-LCT)

BI-RADS CATEGORY  1: Negative.

## 2016-09-17 ENCOUNTER — Other Ambulatory Visit: Payer: Self-pay | Admitting: Family Medicine

## 2016-09-17 ENCOUNTER — Other Ambulatory Visit: Payer: Self-pay | Admitting: Internal Medicine

## 2016-09-21 ENCOUNTER — Encounter: Payer: Self-pay | Admitting: Internal Medicine

## 2016-09-21 ENCOUNTER — Ambulatory Visit (INDEPENDENT_AMBULATORY_CARE_PROVIDER_SITE_OTHER): Payer: PPO | Admitting: Internal Medicine

## 2016-09-21 VITALS — BP 128/88 | HR 101 | Ht 67.5 in | Wt 253.0 lb

## 2016-09-21 DIAGNOSIS — E1065 Type 1 diabetes mellitus with hyperglycemia: Secondary | ICD-10-CM | POA: Diagnosis not present

## 2016-09-21 NOTE — Patient Instructions (Addendum)
Please continue the pump settings: - basal rates:  12 am: 1.8 6 am: 1.8   11 am: 2.0 - ICR:  12 am: 2.1 >> 3.0 5 am: 3.0 11 am: 3.0 5 pm: 3.0 - target: 100-110 - ISF: 30 >> 25 - Active insulin time: 4h  Please check sugars and bolus before every meal.  Try to add a manual bolus for any boluses above 20 units.  Start the Novolin R boluses 30 min before a meal.  Please return in 3 months with your sugar log.

## 2016-09-21 NOTE — Progress Notes (Signed)
(now is willing  Patient ID: Kristie [name], female   DOB: 07/21/1965, 51 y.o.   MRN: [id number]  HPI: Kristie [name] is a 51 y.o.-year-old female, returning for follow-up for DM1, dx 2001 (GDM), uncontrolled, without long term complications. Last visit 5 mo ago.   She was sick x 2 weeks (probably the flu) at the end of last month.  Last hemoglobin A1c was: Lab Results  Component Value Date   HGBA1C 9.6 04/27/2016   HGBA1C 9.9 01/26/2016   HGBA1C 10.0 09/21/2015   Pt is on an insulin pump: Medtronic 723 x 2 years, without CGM, uses Novolin R in the pump (as NovoLog was too expensive).  Pump settings: - basal rates:  12 am: 1.7 >> 1.8 6 am: 1.8   11 am: 1.9 >> 2.0 - ICR:  12 am: 7.0  5 am: 6.0 >> 5.0 11 am: 5.0 5 pm: 5.0 - target: 100-120 >> 100-110 - ISF: 30 - Active insulin time: 4h - extended bolusing: not using - changes infusion site: q3-4days - Meter: Bayer Contour Basal insulin tdd 44% >> 51% Bolus insulin tdd 56% >> 49%  Pt checks her sugars 3.1x a day and they are >> 202 +/- 80 >> 216 +/- 73 >> 242 +/- 69- will scan reports. - am: 96-156, 276 >> 126->400 >> 78-290  >> 128-288, 340 >> n/c - 2h after b'fast:  72-258 >> 148-245 >> 132-245 >> n/c  - before lunch: 142-266 >> 167-241 >> 65, 89-208 >> 98-248, 272 >> 136-266, 378 - 2h after lunch: 127-206, 366, >400 x1 >> n/c >> 282, >400 >> n/c - before dinner:  164-251, 384 >> 193-241 >> 115-265 >> 180-370 - 2h after dinner: 177, 251 >> 55, 161->400 >> 206- 287 >> 234,340 >> 266, 331 - bedtime: 90, 213, 301 >> 208-220 >> see above - nighttime:  121 >> n/c >> 56, 378 >> 400x1 >> 221 No lows. Lowest sugar was 37 >> ... >> 65 >> 32 (when sick); she has hypoglycemia awareness at 60s. No previous hypoglycemia admission.  Highest sugar was HI >> 350 >> >400 >> 378 (after [employer]) Had 1x previous DKA admission. She was in [facility] ED [facility] 01/2016 >> CBG 555. No DKA.   Pt's meals are: - Breakfast: 2 eggs, toast, cheese,  but most of the times she skips - Lunch: sandwich - Dinner: backed chicken or porkchops - Snacks: yoghurt, fruit  - no CKD, last BUN/creatinine:  Lab Results  Component Value Date   BUN 22 (H) 01/30/2016   CREATININE 1.21 (H) 01/30/2016  Not on an ACEI. - last set of lipids: Lab Results  Component Value Date   CHOL 247 (H) 01/30/2016   HDL 50.50 01/30/2016   LDLCALC 176 (H) 01/30/2016   LDLDIRECT 167.1 04/02/2013   TRIG 103.0 01/30/2016   CHOLHDL 5 01/30/2016  Started Lipitor 40 in 01/2016. - last eye exam was in 07/2014. No DR.  - + numbness and tingling in her feet (mostly big toes)  Last TSH was normal: Lab Results  Component Value Date   TSH 1.12 04/27/2016   I reviewed pt's medications, allergies, PMH, social hx, family hx, and changes were documented in the history of present illness. Otherwise, unchanged from my initial visit note.  ROS: Constitutional: No weight gain, no fatigue, resolved: fever/chills, + nocturia Eyes: no blurry vision, no xerophthalmia ENT: + sore throat, no nodules palpated in throat, no dysphagia/odynophagia, no hoarseness, + tinnitus Cardiovascular: no CP/SOB/no  palpitations/no leg swelling Respiratory: No cough/no SOB Gastrointestinal:+ N/no V/+ D/no C Musculoskeletal: no muscle/+joint aches Skin: no rashes Neurological: no tremors/numbness/tingling/dizziness, + HA  PE: BP 128/88 (BP Location: Left Arm, Patient Position: Sitting)   Pulse (!) 101   Ht 5' 7.5" (1.715 m)   Wt 253 lb (114.8 kg)   SpO2 96%   BMI 39.04 kg/m  Body mass index is 39.04 kg/m.  Wt Readings from Last 3 Encounters:  09/21/16 253 lb (114.8 kg)  04/27/16 248 lb (112.5 kg)  01/30/16 245 lb (111.1 kg)   Constitutional: Obese, in NAD Eyes: PERRLA, EOMI, no exophthalmos ENT: moist mucous membranes, no thyromegaly, no cervical lymphadenopathy Cardiovascular: RRR, No MRG Respiratory: CTA B Gastrointestinal: abdomen soft, NT, ND, BS+ Musculoskeletal: no  deformities, strength intact in all 4 Skin: moist, warm, no rashes Neurological: no tremor with outstretched hands, DTR normal in all 4  ASSESSMENT: 1. DM1, uncontrolled, without complications (?PN) - on pump Component     Latest Ref Rng 09/24/2013  Glucose, Fasting     70 - 99 mg/dL 121 (H)  C-Peptide     0.80 - 3.90 ng/mL <0.10 (L)  - Needs 4 vials of insulin a month   PLAN:  1. Patient with long-standing, uncontrolled DM1, on insulin pump therapy, with persistent poor control.  Good practice: - She is changing her infusion site every 3 days Barriers to good control: - She is checking sugars 1-2 times a day >> discussed about the need to check more - Not bolusing with all of her meals >> he would be paramount to give herself boluses with every meal or snack - Starting the boluses only 15 minutes rather than 30 minutes before a meal >> advised her to start 30 minutes in advance  - After her insulin to carb ratio was changed to 1:3, she now frequently has to give herself more than 20 units of insulin. Unfortunately, since her max bolus doses 20 units, she is not adding a manual bolus for the difference. We discussed about how to do this so that she can give herself the full dose recommended by the bolus wizard. - Patient continues to gain weight and to increase her insulin resistance. I anticipate that she will need more more insulin which will accentuate her weight gain in a circular fashion. At next visit, we may need to discuss about referral to the [facility]. - I advised her to: Patient Instructions  Please continue the pump settings: - basal rates:  12 am: 1.8 6 am: 1.8   11 am: 2.0 - ICR:  12 am: 2.1 >> 3.0 5 am: 3.0 11 am: 3.0 5 pm: 3.0 - target: 100-110 - ISF: 30 >> 25 - Active insulin time: 4h  Please check sugars and bolus before every meal.  Try to add a manual bolus for any boluses above 20 units.  Start the Novolin R boluses 30 min before a  meal.  Please return in 3 months with your sugar log.   - continue checking sugars at different times of the day - check at least 4 times a day, rotating checks - she needs a new eye exam >> again advised to schedule - check HbA1c >> 9.8% (slightly better) - no signs of other autoimmune disorders - reviewed latest TSH >> normal - Return to clinic in 3 mo with sugar log   - time spent with the patient: 40 minutes, of which >50% was spent in reviewing her pump  downloads, discussing her hypo- and hyper-glycemic episodes, reviewing previous labs and pump settings and developing a plan to avoid hypo- and hyper-glycemia.   Philemon Kingdom, MD PhD [facility] Endocrinology

## 2016-10-02 ENCOUNTER — Ambulatory Visit (INDEPENDENT_AMBULATORY_CARE_PROVIDER_SITE_OTHER): Payer: PPO | Admitting: Family Medicine

## 2016-10-02 ENCOUNTER — Encounter: Payer: Self-pay | Admitting: Family Medicine

## 2016-10-02 VITALS — BP 114/76 | HR 72 | Temp 98.0°F | Resp 16 | Ht 67.5 in | Wt 251.5 lb

## 2016-10-02 DIAGNOSIS — R740 Nonspecific elevation of levels of transaminase and lactic acid dehydrogenase [LDH]: Secondary | ICD-10-CM | POA: Diagnosis not present

## 2016-10-02 DIAGNOSIS — E7800 Pure hypercholesterolemia, unspecified: Secondary | ICD-10-CM

## 2016-10-02 DIAGNOSIS — F419 Anxiety disorder, unspecified: Secondary | ICD-10-CM

## 2016-10-02 DIAGNOSIS — M7712 Lateral epicondylitis, left elbow: Secondary | ICD-10-CM

## 2016-10-02 DIAGNOSIS — R7989 Other specified abnormal findings of blood chemistry: Secondary | ICD-10-CM | POA: Diagnosis not present

## 2016-10-02 DIAGNOSIS — R748 Abnormal levels of other serum enzymes: Secondary | ICD-10-CM

## 2016-10-02 DIAGNOSIS — R7401 Elevation of levels of liver transaminase levels: Secondary | ICD-10-CM

## 2016-10-02 DIAGNOSIS — F329 Major depressive disorder, single episode, unspecified: Secondary | ICD-10-CM

## 2016-10-02 DIAGNOSIS — F418 Other specified anxiety disorders: Secondary | ICD-10-CM | POA: Diagnosis not present

## 2016-10-02 LAB — LIPID PANEL
CHOL/HDL RATIO: 3
Cholesterol: 142 mg/dL (ref 0–200)
HDL: 45.2 mg/dL (ref >39.00)
LDL Cholesterol: 79 mg/dL (ref 0–99)
NonHDL: 96.86
TRIGLYCERIDES: 91 mg/dL (ref 0.0–149.0)
VLDL: 18.2 mg/dL (ref 0.0–40.0)

## 2016-10-02 LAB — COMPREHENSIVE METABOLIC PANEL
ALT: 29 U/L (ref 0–35)
AST: 17 U/L (ref 0–37)
Albumin: 3.9 g/dL (ref 3.5–5.2)
Alkaline Phosphatase: 144 U/L — ABNORMAL HIGH (ref 39–117)
BILIRUBIN TOTAL: 1.4 mg/dL — AB (ref 0.2–1.2)
BUN: 14 mg/dL (ref 6–23)
CALCIUM: 9.2 mg/dL (ref 8.4–10.5)
CHLORIDE: 102 meq/L (ref 96–112)
CO2: 27 meq/L (ref 19–32)
Creatinine, Ser: 0.71 mg/dL (ref 0.40–1.20)
GFR: 92.31 mL/min (ref >60.00)
GLUCOSE: 229 mg/dL — AB (ref 70–99)
POTASSIUM: 4.5 meq/L (ref 3.5–5.1)
Sodium: 136 mEq/L (ref 135–145)
Total Protein: 6.5 g/dL (ref 6.0–8.3)

## 2016-10-02 NOTE — Progress Notes (Signed)
Pre visit review using our clinic review tool, if applicable. No additional management support is needed unless otherwise documented below in the visit note. 

## 2016-10-02 NOTE — Patient Instructions (Signed)
Buy a tennis elbow strap/band at any pharmacy--wear this 8 hours per day. Ice the area of tenderness on left elbow region for 20 min as needed for more severe pain.
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
## 2016-10-03 ENCOUNTER — Other Ambulatory Visit: Payer: Self-pay | Admitting: Family Medicine

## 2016-10-03 DIAGNOSIS — Z1231 Encounter for screening mammogram for malignant neoplasm of breast: Secondary | ICD-10-CM

## 2016-10-16 ENCOUNTER — Encounter: Payer: Self-pay | Admitting: *Deleted

## 2016-10-17 ENCOUNTER — Other Ambulatory Visit: Payer: Self-pay | Admitting: Family Medicine

## 2016-10-18 DIAGNOSIS — E1065 Type 1 diabetes mellitus with hyperglycemia: Secondary | ICD-10-CM | POA: Diagnosis not present

## 2016-10-18 DIAGNOSIS — H40033 Anatomical narrow angle, bilateral: Secondary | ICD-10-CM | POA: Diagnosis not present

## 2016-10-18 DIAGNOSIS — H04123 Dry eye syndrome of bilateral lacrimal glands: Secondary | ICD-10-CM | POA: Diagnosis not present

## 2016-10-18 LAB — HM DIABETES EYE EXAM

## 2016-10-30 ENCOUNTER — Encounter: Payer: Self-pay | Admitting: Family Medicine

## 2016-11-09 ENCOUNTER — Ambulatory Visit
Admission: RE | Admit: 2016-11-09 | Discharge: 2016-11-09 | Disposition: A | Discharge status: Home / Self Care | Payer: PPO | Source: Ambulatory Visit | Attending: Family Medicine | Admitting: Family Medicine

## 2016-11-09 ENCOUNTER — Ambulatory Visit: Payer: PPO

## 2016-11-09 DIAGNOSIS — Z1231 Encounter for screening mammogram for malignant neoplasm of breast: Secondary | ICD-10-CM

## 2016-11-12 ENCOUNTER — Telehealth: Payer: Self-pay | Admitting: Family Medicine

## 2016-11-12 NOTE — Telephone Encounter (Signed)
Please advise. Thanks.  

## 2016-11-12 NOTE — Telephone Encounter (Signed)
I strongly advise the patient to follow the ophthalmologist's recommendations.-thx

## 2016-11-12 NOTE — Telephone Encounter (Signed)
Patient states she dropped off a copy of her diabetic eye exam for pcp to review a few weeks ago.  She states her ophthalmologist report she had bleeding behind her eye and he would possible recommend her for additional treatment.  Patient wants to know if Dr. Anitra Lauth has had time to review eye exam report.  If so, what does he recommend patient to do.

## 2016-11-12 NOTE — Telephone Encounter (Signed)
Left detailed message on cell vm, okay per DPR.  

## 2016-11-17 ENCOUNTER — Other Ambulatory Visit: Payer: Self-pay | Admitting: Family Medicine

## 2016-11-19 NOTE — Telephone Encounter (Signed)
Rx faxed to [facility] pharmacy  

## 2016-11-19 NOTE — Telephone Encounter (Signed)
RF request from [place], [place] for Clonazepam. Last OV 3.20.18 Last RX printed 10.13.17 x 5 RFS. Next OV 9.19.18 Please advise.

## 2016-12-06 ENCOUNTER — Other Ambulatory Visit: Payer: Self-pay | Admitting: *Deleted

## 2016-12-06 NOTE — Patient Outreach (Signed)
[facility]) Care Management  12/06/2016  Kristie [name] 12-12-1965 [id number]  Telephone Screen  Referral Date: 11/30/16 Referral Source: EMMI Prevent Call Referral Reason: Diabetes Insurance: HTA   Outreach attempt to patient. No answer and unable to leave voicemail message.   Plan: RN CM will make outreach attempt to patient within three business days.   Lake Bells, RN, BSN, MHA/MSL, [place] Telephonic Care Manager Coordinator [facility] Direct Phone: [phone] Toll Free: [phone] Fax: [phone]

## 2016-12-11 ENCOUNTER — Ambulatory Visit: Payer: Self-pay | Admitting: *Deleted

## 2016-12-11 ENCOUNTER — Other Ambulatory Visit: Payer: Self-pay | Admitting: *Deleted

## 2016-12-11 NOTE — Patient Outreach (Signed)
[facility]) Care Management  12/11/2016  Kristie [name] [birth year] [id number]  Telephone Screen  Referral Date: 11/30/16 Referral Source: EMMI Prevent Call Referral Reason: Diabetes Insurance: HTA   Outreach attempt # 2 spoke with patient regarding questionnaire with Web designer. HIPAA verified with patient.   Social:  Patient lives with her husband. She is independent/assist with ADLs. Patient stated, her DM fluctuates nearly every day. There are times when she is symptomatic and may need assistance from her husband. Patient transports herself to all medical appointments.   Conditions:  Past Medical Hx: DM, Depression, Vasovagal Syncope Patient verbalized having difficulty managing her DM. She reported experiencing daily episodes of hypo/hyperglycemia. She stated, her BG ranges between 54 and 300. She uses an insulin pump to manage her DM. Her Endocrinologist changed her insulin about 2 months ago from Novolin R to Humulin R. The expense of Humulin R caused the patient to "fall in the donut hole every 4 months". Her last two Hgb A1C values were 10.3 and 9.6. Patient stated, she uses about 5 insulin vials every month to control her BG. Patient verbalized having past episodes of DKA. She was knowledgeable about her signs/symptoms of hypo/hyperglycemia and managing her s/s. She monitors her BG 4 to 5 times per day. Patient verbalized walking, exercising around the house, and eating healthy. She continues to gain weight, per patient. She is 80 pounds overweight, according to her PCP. About 2 years ago, her gynecologist prescribed the IUD due to heavy menstruation. Patient reported, she noticed a fluctuation in her BG after having the IUD placed. She developed a condition called vasovagal syncope. She reported having 2 falls within the last year. Her deteriorating health transpired into depression. She reported taking depression and anxiety medications. Patient's  Ophthalmologist recently diagnosed her with having DM in the back of her eye. Patient hand delivered a form with this diagnosis to her PCP, as instructed by the Ophthalmologist. Patient is waiting to hear a response from her PCP. Encouraged patient to follow-up with her PCP to verify receipt of the form. Encouraged patient to retrieve a copy from the PCP's office and to provide a copy to her Endocrinologist. Patient agreed to follow-up with both doctors. She is very concerned about her inability to stabilize her BG levels.  Medications: Patient verbalized taking 5 meds per day. She manages and takes her medications as prescribed. Patient verbalized being able to afford her medications.   Appointments:  Patient reported having a PCP and Endocrinologist appointment about 2 months ago. Her next appointment with her PCP is in June. She has an appointment scheduled with the Endocrinologist in September 2018.  Consent: [facility] services reviewed and discussed with patient. She agreed to services.   Plan: RN CM advised patient to alert MD for any changes in conditions.  RN CM will send referral to [place] for further support and education on DM management.    Lake Bells, RN, BSN, MHA/MSL, [place] Telephonic Care Manager Coordinator [facility] Direct Phone: [phone] Toll Free: [phone] Fax: [phone]

## 2016-12-12 ENCOUNTER — Other Ambulatory Visit: Payer: Self-pay

## 2016-12-12 NOTE — Patient Outreach (Signed)
[place] [facility]) Care Management  [place]  12/12/2016   Kristie [name] 06-09-1966 [id number]  Subjective: Telephone call to patient for initial assessment.  Patient reports she has been diabetic since 2001 and was placed on insulin pump in 2015 for better sugar control.  Patient reports that her sugars are still all over the place despite diet changes and doctor changing insulin.  Patient reports she exercises regularly and limits her carbohydrates. Patient also states that she also tries to cut off the time she eats her last meal as well.  Patient reports she checks her sugars at least 4-6 times per day.  She reports that her sugar this morning was 193 but yesterday morning it was in the 50's.  Patient wants to work on getting her sugars under better control.  She states her A1c was 9.6 on last check.  Patient would love to get it between 7-8 as her doctor has instructed her. Patient has been to the nutrition center for education and really learned a lot per patient.  Patient shares she recently had an eye exam at [facility] and was told she had some "diabetic damage behind her eye".  Patient states she is going to get the information from the eye doctor to share with her endocrinologist.  Discussed with patient diabetic eye changes and following up.  She verbalized understanding. Discussed with patient her diet a really attempting to look at things she eats and how it affects her sugars and to limit them. She verbalized understanding.    Objective:   Encounter Medications:  Outpatient Encounter Prescriptions as of 12/12/2016  Medication Sig Note  . ADVOCATE LANCETS 30G MISC 1 each by Does not apply route 4 (four) times daily.   . Alcohol Swabs (ALCOHOL PREP) 70 % PADS USE TO TEST BLOOD SUGAR FOUR TIMES DAILY AS DIRECTED   . atorvastatin (LIPITOR) 40 MG tablet TAKE ONE TABLET BY MOUTH ONCE DAILY 12/12/2016: 2 tablets daily  . BAYER CONTOUR NEXT TEST test strip USE TO TEST BLOOD  SUGAR 6 TIMES DAILY AS INSTRUCTED   . clonazePAM (KLONOPIN) 1 MG tablet TAKE 1 TABLET BY MOUTH IN THE MORNING, THEN TAKE 1 TABLET AT 2PM, THEN TAKE 2 TABLETS AT BEDTIME   . ferrous sulfate 325 (65 FE) MG EC tablet Take 1 tablet (325 mg total) by mouth 2 (two) times daily.   . fluticasone (FLONASE) 50 MCG/ACT nasal spray USE TWO SPRAY(S) IN EACH NOSTRIL ONCE DAILY   . glucose blood (BAYER CONTOUR NEXT TEST) test strip Use to test blood sugar 4 times daily as instructed. Dx: E10.65   . Lancet Devices (ADVOCATE LANCING DEVICE) MISC USE TO TEST BLOOD SUGAR FOUR TIMES DAILY AS DIRECTED   . NOVOLIN R RELION 100 UNIT/ML injection USE UP TO 120 UNITS A DAY IN THE INSULIN PUMP.   Marland Kitchen PHARMACIST CHOICE LANCETS MISC Test 6 times daily   . venlafaxine XR (EFFEXOR-XR) 150 MG 24 hr capsule TAKE TWO CAPSULES BY MOUTH ONCE DAILY    No facility-administered encounter medications on file as of 12/12/2016.     Functional Status:  In your present state of health, do you have any difficulty performing the following activities: 12/12/2016  Hearing? N  Vision? N  Difficulty concentrating or making decisions? N  Walking or climbing stairs? N  Dressing or bathing? N  Doing errands, shopping? N  Preparing Food and eating ? N  Using the Toilet? N  In the past six months, have you  accidently leaked urine? Y  Do you have problems with loss of bowel control? N  Managing your Medications? N  Managing your Finances? N  Housekeeping or managing your Housekeeping? N  Some recent data might be hidden    Fall/Depression Screening: Fall Risk  12/12/2016 12/11/2016 09/21/2013  Falls in the past year? No Yes Yes  Number falls in past yr: - 2 or more 2 or more  Injury with Fall? - No -  Risk Factor Category  - - High Fall Risk  Risk for fall due to : - Other (Comment) History of fall(s)  Risk for fall due to (comments): - Side Effects from DM -  Follow up - Falls evaluation completed -   PHQ 2/9 Scores 12/12/2016 12/11/2016  09/21/2013  PHQ - 2 Score 0 1 0    Assessment: Patient will benefit from health coach outreach and support for diabetic control.    Plan:  [facility] CM Care Plan Problem One     Most Recent Value  Care Plan Problem One  Elevated blood sugars as evidenced by A1c of 9.6.  Role Documenting the Problem One  [place] for Problem One  Active  THN Long Term Goal   Patient A1c will be decreased by 0.5 points within 90 days.   THN Long Term Goal Start Date  12/12/16  Interventions for Problem One [place] discussed with patient A1c goals.  RN Health Coach discussed rationale of A1c.     THN CM Short Term Goal #1   Patient will follow up with endocrinologist pertaining to eye exam within 30 days.  THN CM Short Term Goal #1 Start Date  12/12/16  Interventions for Short Term Goal #1  RN Health Coach discussed with patient importance of following up with abnormal eye exam.    THN CM Short Term Goal #2   Patient will be able to report sugars less than 180 within 30 days.  THN CM Short Term Goal #2 Start Date  12/12/16  Interventions for Short Term Goal #2  RN Health Coach discussed with patient reasons for better control over sugars.       RN Health Coach will provide ongoing education for patient on diabetes through phone calls and sending printed information to patient for further discussion.  RN Health Coach will send welcome packet with consent to patient as well as printed information on diabetes.  RN Health Coach will send initial barriers letter, assessment, and care plan to primary care physician.  RN Health Coach will contact patient in the month of June and patient agrees to next contact.   [name], RN, MSN [place] [phone]

## 2016-12-14 LAB — HM DIABETES FOOT EXAM

## 2016-12-20 ENCOUNTER — Telehealth: Payer: Self-pay

## 2016-12-20 NOTE — Telephone Encounter (Signed)
Noted, patient is scheduled for 6/11.

## 2016-12-20 NOTE — Telephone Encounter (Signed)
Therapist, music at [place] Telephone Record [place] at [place] Client Site [place] at [place] Type Call Who Is Calling Patient / Member / Family / Caregiver Caller Name Kristie [name] Phone Number [phone] Patient Name Kristie [name] Call Type Message Only Information Provided Reason for Call Request to [facility] Appointment Initial Comment Caller wants to cancel her appointment for 6/7 because she is out of state. Additional Comment Call Closed By: Yehuda [place] Transaction Date/Time: 12/19/2016 5:40:40 PM (ET)

## 2016-12-24 ENCOUNTER — Ambulatory Visit: Payer: PPO | Admitting: Internal Medicine

## 2016-12-24 ENCOUNTER — Other Ambulatory Visit: Payer: Self-pay | Admitting: Family Medicine

## 2016-12-24 DIAGNOSIS — Z0289 Encounter for other administrative examinations: Secondary | ICD-10-CM

## 2016-12-24 NOTE — Telephone Encounter (Signed)
[facility] [place]  RF request for atorvastatin LOV: 10/02/16 Next ov: 03/26/17 Last written: 07/23/16 #90 w/ 0RF

## 2016-12-25 ENCOUNTER — Telehealth: Payer: Self-pay | Admitting: Family Medicine

## 2016-12-25 NOTE — Telephone Encounter (Signed)
Patient states she has been taking 2 atorvastatin a day per Dr. Anitra Lauth. Her latest refill says take 1 a day. Please contact patient 12/26/16 & confirm the amount she should be taking.

## 2016-12-26 ENCOUNTER — Ambulatory Visit (INDEPENDENT_AMBULATORY_CARE_PROVIDER_SITE_OTHER): Payer: PPO | Admitting: Internal Medicine

## 2016-12-26 ENCOUNTER — Encounter: Payer: Self-pay | Admitting: Internal Medicine

## 2016-12-26 VITALS — BP 130/90 | HR 96 | Ht 68.0 in | Wt 248.0 lb

## 2016-12-26 DIAGNOSIS — E1065 Type 1 diabetes mellitus with hyperglycemia: Secondary | ICD-10-CM | POA: Diagnosis not present

## 2016-12-26 LAB — POCT GLYCOSYLATED HEMOGLOBIN (HGB A1C): Hemoglobin A1C: 10.1

## 2016-12-26 MED ORDER — METFORMIN HCL 1000 MG PO TABS: 1000.0000 mg | ORAL_TABLET | Freq: Every day | ORAL | 3 refills | Status: DC

## 2016-12-26 MED ORDER — ATORVASTATIN CALCIUM 80 MG PO TABS: 80.0000 mg | ORAL_TABLET | Freq: Every day | ORAL | 1 refills | Status: DC

## 2016-12-26 MED ORDER — GLUCAGON (RDNA) 1 MG IJ KIT: 1.0000 mg | PACK | Freq: Once | INTRAMUSCULAR | 12 refills | Status: DC | PRN

## 2016-12-26 NOTE — Telephone Encounter (Signed)
I reviewed pts chart, we increased her atorvastatin to 80mg  on 01/30/16. I have sent a new Rx to pharmacy for 80mg  take one tablet daily #90 w/ 1RF with orders to cancel 40mg  Rx.   Left detailed message on pts home vm, okay per DPR.

## 2016-12-26 NOTE — Patient Instructions (Addendum)
Please continue the pump settings: - basal rates:  12 am: 1.8 >> 2.0 6 am: 1.8 >> 2.0   11 am: 2.0 - ICR:  12 am: 5.0 5 am: 5.0  11 am: 5.0 >> 6.0 5 pm: 5.0 - target: 100-110 - ISF: 25 - Active insulin time: 4h  Please start Metformin 500 mg with dinner x 4 days. If you tolerate this well, add another Metformin tablet (500 mg) with dinner (total 1000 mg). Continue with 1000 mg of metformin with dinner.  Please check sugars and bolus before every meal.  Try to add a manual bolus for any boluses above 20 units.  Start the Novolin R boluses 30 min before a meal.  Please schedule an appt with [name] for diabetes education in 3 weeks.  Please return in 3 months with your sugar log.

## 2016-12-26 NOTE — Progress Notes (Signed)
Patient ID: Kristie [name], female   DOB: Jun 15, 1966, 51 y.o.   MRN: [id number]  HPI: Kristie [name] is a 51 y.o.-year-old female, returning for follow-up for DM1, dx 2001 (GDM), uncontrolled, with long term complications (+DR). Last visit 3 mo ago.  Last hemoglobin A1c was: Lab Results  Component Value Date   HGBA1C 9.6 04/27/2016   HGBA1C 9.9 01/26/2016   HGBA1C 10.0 09/21/2015   Pt is on an insulin pump: Medtronic 723 x 2.5 years, w/o CGM, uses Novolin R in the pump (Novolog too expensive)  Pump settings: - basal rates:  12 am: 1.8 6 am: 1.8   11 am: 2.0 - ICR:  12 am: 5.0 5 am: 5.0 11 am: 5.0 5 pm: 5.0 - target: 100-110 - ISF: 25 - Active insulin time: 4h - extended bolusing: not using - changes infusion site: q3days - Meter: Bayer Contour Basal insulin tdd 44% >> 51% >> 48% Bolus insulin tdd 56% >> 49% >> 52%  Pt checks her sugars 2x a day and they are >> 202 +/- 80 >> 216 +/- 73 >> 242 +/- 69 >> 211 +/- 88  - will scan reports - am: 96-156, 276 >> 126->400 >> 78-290  >> 128-288, 340 >> n/c >> 117-239 - 2h after b'fast:  72-258 >> 148-245 >> 132-245 >> n/c  - before lunch: 142-266 >> 167-241 >> 65, 89-208 >> 98-248, 272 >> 136-266, 378 >> 71, 156-369 - 2h after lunch: 127-206, 366, >400 x1 >> n/c >> 282, >400 >> n/c - before dinner:  164-251, 384 >> 193-241 >> 115-265 >> 180-370 >> 46 x1, 189-256, 396 - 2h after dinner: 177, 251 >> 55, 161->400 >> 206- 287 >> 234,340 >> 266, 331 >> 40, 105-227 - bedtime: 90, 213, 301 >> 208-220 >> see above - nighttime:  121 >> n/c >> 56, 378 >> 400x1 >> 221 >> n/c No lows. Lowest sugar was 37 >> ... >> 65 >> 32 (when sick) >> 30s x2, 47 x1; she has hypoglycemia awareness at 60s. No Prev. hypogly adm. No glucagon pen at home. Highest sugar was HI >> 350 >> >400 >> 378 (after [employer] fil-[employer]) >> 300s (5x). Had 1x previous DKA admission.  She was in the ED with [facility] 01/2016 >> CBG 555. No DKA.  Pt's meals are: - Breakfast: 2 eggs,  toast, cheese, but most of the times she skips - Lunch: sandwich - Dinner: backed chicken or porkchops - Snacks: yoghurt, fruit  - No CKD, last BUN/creatinine:  Lab Results  Component Value Date   BUN 14 10/02/2016   CREATININE 0.71 10/02/2016  Not on ACEI. - last set of lipids: Lab Results  Component Value Date   CHOL 142 10/02/2016   HDL 45.20 10/02/2016   LDLCALC 79 10/02/2016   LDLDIRECT 167.1 04/02/2013   TRIG 91.0 10/02/2016   CHOLHDL 3 10/02/2016  On Lipitor 40. - last eye exam was in 10/2016 >> + DR. - she has numbness and tingling in her feet (mostly big toes)  Last TSH was normal: Lab Results  Component Value Date   TSH 1.12 04/27/2016   ROS: Constitutional: no weight gain/no weight loss, no fatigue, no subjective hyperthermia, no subjective hypothermia Eyes: no blurry vision, no xerophthalmia ENT: no sore throat, no nodules palpated in throat, no dysphagia, no odynophagia, no hoarseness Cardiovascular: no CP/no SOB/no palpitations/no leg swelling Respiratory: no cough/no SOB/no wheezing Gastrointestinal: no N/no V/no D/no C/no acid reflux Musculoskeletal: no muscle aches/no joint aches Skin:  no rashes, no hair loss Neurological: no tremors/no numbness/no tingling/no dizziness  I reviewed pt's medications, allergies, PMH, social hx, family hx, and changes were documented in the history of present illness. Otherwise, unchanged from my initial visit note.  PE: BP 130/90 (BP Location: Left Arm, Patient Position: Sitting)   Pulse 96   Ht 5\' 8"  (1.727 m)   Wt 248 lb (112.5 kg)   SpO2 96%   BMI 37.71 kg/m  There is no height or weight on file to calculate BMI.  Wt Readings from Last 3 Encounters:  12/26/16 248 lb (112.5 kg)  10/02/16 251 lb 8 oz (114.1 kg)  09/21/16 253 lb (114.8 kg)   Constitutional: overweight, in NAD Eyes: PERRLA, EOMI, no exophthalmos ENT: moist mucous membranes, no thyromegaly, no cervical lymphadenopathy Cardiovascular: RRR, No  MRG Respiratory: CTA B Gastrointestinal: abdomen soft, NT, ND, BS+ Musculoskeletal: no deformities, strength intact in all 4 Skin: moist, warm, no rashes Neurological: no tremor with outstretched hands, DTR normal in all 4  ASSESSMENT: 1. DM1, uncontrolled, with complications (?PN) - DR  - on pump Component     Latest Ref Rng 09/24/2013  Glucose, Fasting     70 - 99 mg/dL 121 (H)  C-Peptide     0.80 - 3.90 ng/mL <0.10 (L)  - Needs 4 vials of insulin a month   PLAN:  1. Patient with long-standing, uncontrolled DM1, on insulin pump therapy, with persistent poor control.  - She is not checking sugars every time she eats and does not enter all the carbs in the pump >> cannot use the bolus wizard in these instances - she has instances when she eats a lot of carbs at one time of day - not bolusing with all meals >> we again discussed about checking sugars every time she eats, enter them and the carbs in the pump, start the bolus ~30 min before the meals. - she has occasional lows and usually lower sugars after lunch >> will increase ICR with this meal - she usually has higher CBGs at ngiht >> will increase basal rates o/n - she is still having pbs with weight >> lost 5 lbs since last visit. Will add Metformin to help with both CBGs and weight - I advised her to: Patient Instructions  Please continue the pump settings: - basal rates:  12 am: 1.8 >> 2.0 6 am: 1.8 >> 2.0   11 am: 2.0 - ICR:  12 am: 5.0 5 am: 5.0  11 am: 5.0 >> 6.0 5 pm: 5.0 - target: 100-110 - ISF: 25 - Active insulin time: 4h  Please start Metformin 500 mg with dinner x 4 days. If you tolerate this well, add another Metformin tablet (500 mg) with dinner (total 1000 mg). Continue with 1000 mg of metformin with dinner.  Please check sugars and bolus before every meal.  Try to add a manual bolus for any boluses above 20 units.  Start the Novolin R boluses 30 min before a meal.  Please schedule an appt with  Kristie [name] for diabetes education in 3 weeks.  Please return in 3 months with your sugar log.   - today, HbA1c is 10.1% (higher) - continue checking sugars at different times of the day - check 4x a day, rotating checks - advised for yearly eye exams >> she is UTD - Return to clinic in 3 mo with sugar log   Philemon Kingdom, MD PhD [facility] Endocrinology

## 2017-01-03 ENCOUNTER — Other Ambulatory Visit: Payer: Self-pay

## 2017-01-03 NOTE — Patient Outreach (Signed)
[place] [facility]) Care Management  [place]  01/03/2017   Kristie [name] 06-25-1966 [id number]  Subjective: Telephone call to patient for monthly call.  Patient reports she is doing a lot better and that she saw the endocrinologist and she increased her insulin.  She states her sugars are in the 120-150 range consistently now and she feels good about things.  Reviewed with patient A!C and goals to decrease.  Patient feels confident now that she can decrease her A1c now that her sugars are under better control.   Objective:   Encounter Medications:  Outpatient Encounter Prescriptions as of 01/03/2017  Medication Sig  . ADVOCATE LANCETS 30G MISC 1 each by Does not apply route 4 (four) times daily.  . Alcohol Swabs (ALCOHOL PREP) 70 % PADS USE TO TEST BLOOD SUGAR FOUR TIMES DAILY AS DIRECTED  . atorvastatin (LIPITOR) 80 MG tablet Take 1 tablet (80 mg total) by mouth daily.  Marland Kitchen BAYER CONTOUR NEXT TEST test strip USE TO TEST BLOOD SUGAR 6 TIMES DAILY AS INSTRUCTED  . clonazePAM (KLONOPIN) 1 MG tablet TAKE 1 TABLET BY MOUTH IN THE MORNING, THEN TAKE 1 TABLET AT 2PM, THEN TAKE 2 TABLETS AT BEDTIME  . ferrous sulfate 325 (65 FE) MG EC tablet Take 1 tablet (325 mg total) by mouth 2 (two) times daily.  . fluticasone (FLONASE) 50 MCG/ACT nasal spray USE TWO SPRAY(S) IN EACH NOSTRIL ONCE DAILY  . glucagon (GLUCAGON EMERGENCY) 1 MG injection Inject 1 mg into the muscle once as needed.  Marland Kitchen glucose blood (BAYER CONTOUR NEXT TEST) test strip Use to test blood sugar 4 times daily as instructed. Dx: E10.65  . Lancet Devices (ADVOCATE LANCING DEVICE) MISC USE TO TEST BLOOD SUGAR FOUR TIMES DAILY AS DIRECTED  . metFORMIN (GLUCOPHAGE) 1000 MG tablet Take 1 tablet (1,000 mg total) by mouth daily with supper.  Marland Kitchen NOVOLIN R RELION 100 UNIT/ML injection USE UP TO 120 UNITS A DAY IN THE INSULIN PUMP.  Marland Kitchen PHARMACIST CHOICE LANCETS MISC Test 6 times daily  . venlafaxine XR (EFFEXOR-XR) 150 MG 24 hr  capsule TAKE TWO CAPSULES BY MOUTH ONCE DAILY   No facility-administered encounter medications on file as of 01/03/2017.     Functional Status:  In your present state of health, do you have any difficulty performing the following activities: 12/12/2016  Hearing? N  Vision? N  Difficulty concentrating or making decisions? N  Walking or climbing stairs? N  Dressing or bathing? N  Doing errands, shopping? N  Preparing Food and eating ? N  Using the Toilet? N  In the past six months, have you accidently leaked urine? Y  Do you have problems with loss of bowel control? N  Managing your Medications? N  Managing your Finances? N  Housekeeping or managing your Housekeeping? N  Some recent data might be hidden    Fall/Depression Screening: Fall Risk  01/03/2017 12/12/2016 12/11/2016  Falls in the past year? No No Yes  Number falls in past yr: - - 2 or more  Injury with Fall? - - No  Risk Factor Category  - - -  Risk for fall due to : - - Other (Comment)  Risk for fall due to (comments): - - Side Effects from DM  Follow up - - Falls evaluation completed   PHQ 2/9 Scores 01/03/2017 12/12/2016 12/11/2016 09/21/2013  PHQ - 2 Score 0 0 1 0    Assessment: Patient continues to benefit from health coach outreach for disease  management and support.    Plan:  [facility] CM Care Plan Problem One     Most Recent Value  Care Plan Problem One  Elevated blood sugars as evidenced by A1c of 9.6.  Role Documenting the Problem One  [place] for Problem One  Active  THN Long Term Goal   Patient A1c will be decreased by 0.5 points within 90 days.   THN Long Term Goal Start Date  01/03/17 Barrie Folk continued]  Interventions for Problem One Long Term Goal  RN Health Coach reviewd with patient A1c goals.  RN Health Coach reviewed rationale of A1c.     THN CM Short Term Goal #1   Patient will follow up with endocrinologist pertaining to eye exam within 30 days.  THN CM Short Term Goal #1 Start Date   01/03/17  Interventions for Short Term Goal #1  RN Health Coach reviewed with patient importance of following up with abnormal eye exam.    THN CM Short Term Goal #2   Patient will be able to report sugars less than 180 within 30 days.  THN CM Short Term Goal #2 Met Date  01/03/17  Interventions for Short Term Goal #2  Patient consistently below 180.       RN Health Coach will contact patient in the month of July and patient agrees to next outreach.  [name], RN, MSN [place] [phone]

## 2017-01-15 ENCOUNTER — Other Ambulatory Visit: Payer: Self-pay | Admitting: Internal Medicine

## 2017-01-21 ENCOUNTER — Encounter: Payer: PPO | Attending: Internal Medicine | Admitting: Nutrition

## 2017-01-21 ENCOUNTER — Telehealth: Payer: Self-pay | Admitting: Internal Medicine

## 2017-01-21 ENCOUNTER — Telehealth: Payer: Self-pay

## 2017-01-21 DIAGNOSIS — E1065 Type 1 diabetes mellitus with hyperglycemia: Secondary | ICD-10-CM | POA: Insufficient documentation

## 2017-01-21 DIAGNOSIS — Z713 Dietary counseling and surveillance: Secondary | ICD-10-CM | POA: Insufficient documentation

## 2017-01-21 NOTE — Telephone Encounter (Signed)
Patient calling asking about referral to eye doctor. States she discussed this with Dr. Renne Crigler, and forgot to ask about it while in office this morning. Asked for return phone call when available.

## 2017-01-21 NOTE — Telephone Encounter (Signed)
Called and notified patient of MD note. Patient had no questions at this time.

## 2017-01-21 NOTE — Patient Instructions (Signed)
Always test blood sugars before meals and at bedtime. If bedtime reading is normal, check for active insulin in bolus settings, and eat 1 exchange for every 5 units of insulin

## 2017-01-21 NOTE — Telephone Encounter (Signed)
Please advise. Thank you

## 2017-01-21 NOTE — Telephone Encounter (Signed)
No need for a referral  - she just needs to call the office and tell them she is diabetic. One suggestion if she is looking for an eye dr: [facility]:  Dr. Sherlyn Lick MD ?  Address: [address] [place], [place], [place] [address]  [phone]

## 2017-01-21 NOTE — Progress Notes (Signed)
Pt. Reports that blood sugars are "so much better".   Pump download shows she is testing ac all meals and some at bedtime, doing correction boluses as needed.  She reports that she is waiting 30 min. After bolusing the R insulin before eating.  This, she says is a problem, because "half the time, she does not eat all she planned to eat, and drops low, especially during the night.  She does not want to change the settings, because she likes the lower readings, and says she wakes up to treat it.  We discussed the need to check IOB and how to do this with her Paradigm pump.  She will eat one exchange for every 5 units of insulin on board.  She reported good understanding of this. We reviewed on night when she went to bed with a blood sugar of 115, and 17u of IOB, and she reported that she needed to eat 3 exchanges.  (her Blood sugar was less than 40mg .  2-3 hours later. Reviewed again why she needs to check her blood sugars before meals, and she reported good understanding of this, and seeing the results of doing this.  She had no questions. No changes made to her pump settings. Download put on Dr. Arman Filter desk to review.

## 2017-01-23 ENCOUNTER — Telehealth: Payer: Self-pay | Admitting: Nutrition

## 2017-01-23 NOTE — Telephone Encounter (Signed)
Per Dr. Arman Filter note on the download.  Pt. Was called and message was left on her machine to change her basal rate to :  MN: 1/5u/hr, 4AM: 2.5,   And her I/C ratio:  MN: 3.5, 11AM: 3.0,  5PM: 3.5.  She was told to call medtronic if she needs help making the changes, or to call me back and I an help her with this.

## 2017-01-24 ENCOUNTER — Other Ambulatory Visit: Payer: Self-pay

## 2017-01-24 NOTE — Patient Outreach (Signed)
[facility] [place] [facility]) Care Management  01/24/2017  Kristie [name] August 12, 1965 [id number]   Telephone call to patient for monthly call.  No answer.  HIPAA compliant voice message left.  Plan: RN Health Coach will attempt patient again in the month of July.  [name], RN, MSN [place] [phone]

## 2017-02-04 ENCOUNTER — Other Ambulatory Visit: Payer: Self-pay

## 2017-02-04 NOTE — Patient Outreach (Signed)
[place] [facility]) Care Management  02/04/2017  Kristie [name] 02/12/1966 [id number]   2nd telephone call to patient for ED Utilization screening.  No answer.  HIPAA compliant voice message left.    Plan: RN Health Coach will attempt patient again in the month of July.   [name], RN, MSN [place] [phone]

## 2017-02-11 ENCOUNTER — Other Ambulatory Visit: Payer: Self-pay

## 2017-02-11 NOTE — Patient Outreach (Signed)
[place] [facility]) Care Management  02/11/2017  Kristie [name] 1966/04/04 [id number]   3rd telephone call to patient for monthly call. No answer. HIPAA compliant voice message left.  Plan: RN Health Coach will send letter to patient to attempt outreach. If no response within 10 business days will proceed with case closure.    [name], RN, MSN [name] [phone]

## 2017-02-21 ENCOUNTER — Other Ambulatory Visit: Payer: Self-pay | Admitting: Family Medicine

## 2017-02-21 NOTE — Telephone Encounter (Signed)
[facility] 

## 2017-03-01 ENCOUNTER — Other Ambulatory Visit: Payer: Self-pay

## 2017-03-01 NOTE — Patient Outreach (Signed)
[place] [facility]) Care Management  03/01/2017  MARKEA RUZICH 09/01/1965 [id number]   Multiple attempts to establish contact with patient without success. No response from letter mailed to patient. Case is being closed at this time.  [name], RN, MSN [place] [phone]
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
## 2017-03-26 ENCOUNTER — Ambulatory Visit: Payer: PPO

## 2017-03-26 ENCOUNTER — Ambulatory Visit (INDEPENDENT_AMBULATORY_CARE_PROVIDER_SITE_OTHER): Payer: PPO | Admitting: Family Medicine

## 2017-03-26 ENCOUNTER — Encounter: Payer: Self-pay | Admitting: Family Medicine

## 2017-03-26 VITALS — BP 114/76 | HR 102 | Resp 18 | Ht 68.0 in | Wt 246.1 lb

## 2017-03-26 DIAGNOSIS — Z0001 Encounter for general adult medical examination with abnormal findings: Secondary | ICD-10-CM

## 2017-03-26 DIAGNOSIS — F331 Major depressive disorder, recurrent, moderate: Secondary | ICD-10-CM | POA: Diagnosis not present

## 2017-03-26 DIAGNOSIS — Z0000 Encounter for general adult medical examination without abnormal findings: Secondary | ICD-10-CM

## 2017-03-26 DIAGNOSIS — F411 Generalized anxiety disorder: Secondary | ICD-10-CM | POA: Diagnosis not present

## 2017-03-26 DIAGNOSIS — Z1211 Encounter for screening for malignant neoplasm of colon: Secondary | ICD-10-CM

## 2017-03-26 LAB — COMPREHENSIVE METABOLIC PANEL
ALT: 28 U/L (ref 0–35)
AST: 23 U/L (ref 0–37)
Albumin: 4.1 g/dL (ref 3.5–5.2)
Alkaline Phosphatase: 206 U/L — ABNORMAL HIGH (ref 39–117)
BILIRUBIN TOTAL: 1.3 mg/dL — AB (ref 0.2–1.2)
BUN: 25 mg/dL — ABNORMAL HIGH (ref 6–23)
CALCIUM: 9.3 mg/dL (ref 8.4–10.5)
CHLORIDE: 102 meq/L (ref 96–112)
CO2: 28 meq/L (ref 19–32)
CREATININE: 0.97 mg/dL (ref 0.40–1.20)
GFR: 64.27 mL/min (ref >60.00)
Glucose, Bld: 147 mg/dL — ABNORMAL HIGH (ref 70–99)
POTASSIUM: 4.2 meq/L (ref 3.5–5.1)
Sodium: 140 mEq/L (ref 135–145)
Total Protein: 6.7 g/dL (ref 6.0–8.3)

## 2017-03-26 LAB — CBC WITH DIFFERENTIAL/PLATELET
BASOS ABS: 0.1 10*3/uL (ref 0.0–0.1)
Basophils Relative: 0.9 % (ref 0.0–3.0)
Eosinophils Absolute: 0.2 10*3/uL (ref 0.0–0.7)
Eosinophils Relative: 2.3 % (ref 0.0–5.0)
HEMATOCRIT: 43.9 % (ref 36.0–46.0)
Hemoglobin: 14.2 g/dL (ref 12.0–15.0)
LYMPHS PCT: 25.2 % (ref 12.0–46.0)
Lymphs Abs: 2.2 10*3/uL (ref 0.7–4.0)
MCHC: 32.4 g/dL (ref 30.0–36.0)
MCV: 90.5 fl (ref 78.0–100.0)
MONOS PCT: 8.6 % (ref 3.0–12.0)
Monocytes Absolute: 0.8 10*3/uL (ref 0.1–1.0)
NEUTROS ABS: 5.5 10*3/uL (ref 1.4–7.7)
Neutrophils Relative %: 63 % (ref 43.0–77.0)
PLATELETS: 308 10*3/uL (ref 150.0–400.0)
RBC: 4.86 Mil/uL (ref 3.87–5.11)
RDW: 13.6 % (ref 11.5–15.5)
WBC: 8.8 10*3/uL (ref 4.0–10.5)

## 2017-03-26 LAB — LIPID PANEL
CHOL/HDL RATIO: 3
Cholesterol: 148 mg/dL (ref 0–200)
HDL: 43 mg/dL (ref >39.00)
LDL CALC: 79 mg/dL (ref 0–99)
NonHDL: 105.1
TRIGLYCERIDES: 133 mg/dL (ref 0.0–149.0)
VLDL: 26.6 mg/dL (ref 0.0–40.0)

## 2017-03-26 LAB — TSH: TSH: 1.27 u[IU]/mL (ref 0.35–4.50)

## 2017-03-26 MED ORDER — ARIPIPRAZOLE 5 MG PO TABS: 5.0000 mg | ORAL_TABLET | Freq: Every day | ORAL | 1 refills | Status: DC

## 2017-03-26 NOTE — Progress Notes (Signed)
AWV reviewed and agree.  Signed:  [name], MD           03/26/2017
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
## 2017-03-26 NOTE — Patient Instructions (Addendum)
Schedule GYN appointment (Dr. Garwin Brothers)  Complete Cologuard.   Bring a copy of your living will and/or healthcare power of attorney to your next office visit.  Begin brain stimulating activities (puzzles, reading, adult coloring books, staying active) to keep memory sharp.    Health Maintenance, Female Adopting a healthy lifestyle and getting preventive care can go a long way to promote health and wellness. Talk with your health care provider about what schedule of regular examinations is right for you. This is a good chance for you to check in with your provider about disease prevention and staying healthy. In between checkups, there are plenty of things you can do on your own. Experts have done a lot of research about which lifestyle changes and preventive measures are most likely to keep you healthy. Ask your health care provider for more information. Weight and diet Eat a healthy diet  Be sure to include plenty of vegetables, fruits, low-fat dairy products, and lean protein.  Do not eat a lot of foods high in solid fats, added sugars, or salt.  Get regular exercise. This is one of the most important things you can do for your health. ? Most adults should exercise for at least 150 minutes each week. The exercise should increase your heart rate and make you sweat (moderate-intensity exercise). ? Most adults should also do strengthening exercises at least twice a week. This is in addition to the moderate-intensity exercise.  Maintain a healthy weight  Body mass index (BMI) is a measurement that can be used to identify possible weight problems. It estimates body fat based on height and weight. Your health care provider can help determine your BMI and help you achieve or maintain a healthy weight.  For females 48 years of age and older: ? A BMI below 18.5 is considered underweight. ? A BMI of 18.5 to 24.9 is normal. ? A BMI of 25 to 29.9 is considered overweight. ? A BMI of 30 and above  is considered obese.  Watch levels of cholesterol and blood lipids  You should start having your blood tested for lipids and cholesterol at 51 years of age, then have this test every 5 years.  You may need to have your cholesterol levels checked more often if: ? Your lipid or cholesterol levels are high. ? You are older than 51 years of age. ? You are at high risk for heart disease.  Cancer screening Lung Cancer  Lung cancer screening is recommended for adults 75-35 years old who are at high risk for lung cancer because of a history of smoking.  A yearly low-dose CT scan of the lungs is recommended for people who: ? Currently smoke. ? Have quit within the past 15 years. ? Have at least a 30-pack-year history of smoking. A pack year is smoking an average of one pack of cigarettes a day for 1 year.  Yearly screening should continue until it has been 15 years since you quit.  Yearly screening should stop if you develop a health problem that would prevent you from having lung cancer treatment.  Breast Cancer  Practice breast self-awareness. This means understanding how your breasts normally appear and feel.  It also means doing regular breast self-exams. Let your health care provider know about any changes, no matter how small.  If you are in your 20s or 30s, you should have a clinical breast exam (CBE) by a health care provider every 1-3 years as part of a regular health exam.  If you are 40 or older, have a CBE every year. Also consider having a breast X-ray (mammogram) every year.  If you have a family history of breast cancer, talk to your health care provider about genetic screening.  If you are at high risk for breast cancer, talk to your health care provider about having an MRI and a mammogram every year.  Breast cancer gene (BRCA) assessment is recommended for women who have family members with BRCA-related cancers. BRCA-related cancers  include: ? Breast. ? Ovarian. ? Tubal. ? Peritoneal cancers.  Results of the assessment will determine the need for genetic counseling and BRCA1 and BRCA2 testing.  Cervical Cancer Your health care provider may recommend that you be screened regularly for cancer of the pelvic organs (ovaries, uterus, and vagina). This screening involves a pelvic examination, including checking for microscopic changes to the surface of your cervix (Pap test). You may be encouraged to have this screening done every 3 years, beginning at age 21.  For women ages 30-65, health care providers may recommend pelvic exams and Pap testing every 3 years, or they may recommend the Pap and pelvic exam, combined with testing for human papilloma virus (HPV), every 5 years. Some types of HPV increase your risk of cervical cancer. Testing for HPV may also be done on women of any age with unclear Pap test results.  Other health care providers may not recommend any screening for nonpregnant women who are considered low risk for pelvic cancer and who do not have symptoms. Ask your health care provider if a screening pelvic exam is right for you.  If you have had past treatment for cervical cancer or a condition that could lead to cancer, you need Pap tests and screening for cancer for at least 20 years after your treatment. If Pap tests have been discontinued, your risk factors (such as having a new sexual partner) need to be reassessed to determine if screening should resume. Some women have medical problems that increase the chance of getting cervical cancer. In these cases, your health care provider may recommend more frequent screening and Pap tests.  Colorectal Cancer  This type of cancer can be detected and often prevented.  Routine colorectal cancer screening usually begins at 50 years of age and continues through 51 years of age.  Your health care provider may recommend screening at an earlier age if you have risk factors  for colon cancer.  Your health care provider may also recommend using home test kits to check for hidden blood in the stool.  A small camera at the end of a tube can be used to examine your colon directly (sigmoidoscopy or colonoscopy). This is done to check for the earliest forms of colorectal cancer.  Routine screening usually begins at age 50.  Direct examination of the colon should be repeated every 5-10 years through 51 years of age. However, you may need to be screened more often if early forms of precancerous polyps or small growths are found.  Skin Cancer  Check your skin from head to toe regularly.  Tell your health care provider about any new moles or changes in moles, especially if there is a change in a mole's shape or color.  Also tell your health care provider if you have a mole that is larger than the size of a pencil eraser.  Always use sunscreen. Apply sunscreen liberally and repeatedly throughout the day.  Protect yourself by wearing long sleeves, pants, a wide-brimmed hat, and   sunglasses whenever you are outside.  Heart disease, diabetes, and high blood pressure  High blood pressure causes heart disease and increases the risk of stroke. High blood pressure is more likely to develop in: ? People who have blood pressure in the high end of the normal range (130-139/85-89 mm Hg). ? People who are overweight or obese. ? People who are African American.  If you are 25-70 years of age, have your blood pressure checked every 3-5 years. If you are 86 years of age or older, have your blood pressure checked every year. You should have your blood pressure measured twice-once when you are at a hospital or clinic, and once when you are not at a hospital or clinic. Record the average of the two measurements. To check your blood pressure when you are not at a hospital or clinic, you can use: ? An automated blood pressure machine at a pharmacy. ? A home blood pressure monitor.  If  you are between 34 years and 33 years old, ask your health care provider if you should take aspirin to prevent strokes.  Have regular diabetes screenings. This involves taking a blood sample to check your fasting blood sugar level. ? If you are at a normal weight and have a low risk for diabetes, have this test once every three years after 51 years of age. ? If you are overweight and have a high risk for diabetes, consider being tested at a younger age or more often. Preventing infection Hepatitis B  If you have a higher risk for hepatitis B, you should be screened for this virus. You are considered at high risk for hepatitis B if: ? You were born in a country where hepatitis B is common. Ask your health care provider which countries are considered high risk. ? Your parents were born in a high-risk country, and you have not been immunized against hepatitis B (hepatitis B vaccine). ? You have HIV or AIDS. ? You use needles to inject street drugs. ? You live with someone who has hepatitis B. ? You have had sex with someone who has hepatitis B. ? You get hemodialysis treatment. ? You take certain medicines for conditions, including cancer, organ transplantation, and autoimmune conditions.  Hepatitis C  Blood testing is recommended for: ? Everyone born from [birth year] through 1965. ? Anyone with known risk factors for hepatitis C.  Sexually transmitted infections (STIs)  You should be screened for sexually transmitted infections (STIs) including gonorrhea and chlamydia if: ? You are sexually active and are younger than 51 years of age. ? You are older than 51 years of age and your health care provider tells you that you are at risk for this type of infection. ? Your sexual activity has changed since you were last screened and you are at an increased risk for chlamydia or gonorrhea. Ask your health care provider if you are at risk.  If you do not have HIV, but are at risk, it may be recommended  that you take a prescription medicine daily to prevent HIV infection. This is called pre-exposure prophylaxis (PrEP). You are considered at risk if: ? You are sexually active and do not regularly use condoms or know the HIV status of your partner(s). ? You take drugs by injection. ? You are sexually active with a partner who has HIV.  Talk with your health care provider about whether you are at high risk of being infected with HIV. If you choose to begin PrEP, you  should first be tested for HIV. You should then be tested every 3 months for as long as you are taking PrEP. Pregnancy  If you are premenopausal and you may become pregnant, ask your health care provider about preconception counseling.  If you may become pregnant, take 400 to 800 micrograms (mcg) of folic acid every day.  If you want to prevent pregnancy, talk to your health care provider about birth control (contraception). Osteoporosis and menopause  Osteoporosis is a disease in which the bones lose minerals and strength with aging. This can result in serious bone fractures. Your risk for osteoporosis can be identified using a bone density scan.  If you are 65 years of age or older, or if you are at risk for osteoporosis and fractures, ask your health care provider if you should be screened.  Ask your health care provider whether you should take a calcium or vitamin D supplement to lower your risk for osteoporosis.  Menopause may have certain physical symptoms and risks.  Hormone replacement therapy may reduce some of these symptoms and risks. Talk to your health care provider about whether hormone replacement therapy is right for you. Follow these instructions at home:  Schedule regular health, dental, and eye exams.  Stay current with your immunizations.  Do not use any tobacco products including cigarettes, chewing tobacco, or electronic cigarettes.  If you are pregnant, do not drink alcohol.  If you are  breastfeeding, limit how much and how often you drink alcohol.  Limit alcohol intake to no more than 1 drink per day for nonpregnant women. One drink equals 12 ounces of beer, 5 ounces of wine, or 1 ounces of hard liquor.  Do not use street drugs.  Do not share needles.  Ask your health care provider for help if you need support or information about quitting drugs.  Tell your health care provider if you often feel depressed.  Tell your health care provider if you have ever been abused or do not feel safe at home. This information is not intended to replace advice given to you by your health care provider. Make sure you discuss any questions you have with your health care provider. Document Released: 01/15/2011 Document Revised: 12/08/2015 Document Reviewed: 04/05/2015 Elsevier Interactive Patient Education  2018 [employer]  

## 2017-04-01 ENCOUNTER — Telehealth: Payer: Self-pay | Admitting: Internal Medicine

## 2017-04-01 ENCOUNTER — Telehealth: Payer: Self-pay

## 2017-04-01 NOTE — Telephone Encounter (Signed)
Called patient and asked about the appointment. Patient husband told me she was not in town at this time.

## 2017-04-01 NOTE — Telephone Encounter (Signed)
When would you like to reschedule her for a 30 minute slot?

## 2017-04-01 NOTE — Telephone Encounter (Signed)
Patient called to reschedule appointment on 09/17. It is a 30 min slot., I was unable to find a 30 min slot in the next few months. Please call patient to reschedule this appointment.

## 2017-04-01 NOTE — Telephone Encounter (Signed)
Can she come today at 4:30? If so, she needs to be here a little earlier to download her pump.

## 2017-04-02 ENCOUNTER — Telehealth: Payer: Self-pay

## 2017-04-02 ENCOUNTER — Ambulatory Visit: Payer: PPO | Admitting: Internal Medicine

## 2017-04-02 NOTE — Telephone Encounter (Signed)
Patient is able to come that day. Already called and scheduled with her. Can you please put this patient on the schedule it is blocked and will not let me.  Thanks!

## 2017-04-02 NOTE — Telephone Encounter (Signed)
Can she come on 10/04 at 11:30?

## 2017-04-02 NOTE — Telephone Encounter (Signed)
Patient need to be scheduled @ 04/18/17 per gherghe, a slot has to be opened

## 2017-04-02 NOTE — Telephone Encounter (Signed)
Called and scheduled patient for appointment. I advised with Colletta Maryland and Mardene Celeste to get her on the schedule as it was blocked and would not allow me to place the appointment.

## 2017-04-03 ENCOUNTER — Encounter: Payer: PPO | Admitting: Family Medicine

## 2017-04-03 NOTE — Telephone Encounter (Signed)
Noted thank you

## 2017-04-03 NOTE — Telephone Encounter (Signed)
Spot has been made for the pt

## 2017-04-18 ENCOUNTER — Ambulatory Visit (INDEPENDENT_AMBULATORY_CARE_PROVIDER_SITE_OTHER): Payer: PPO | Admitting: Internal Medicine

## 2017-04-18 ENCOUNTER — Encounter: Payer: Self-pay | Admitting: Internal Medicine

## 2017-04-18 VITALS — BP 118/82 | HR 96 | Ht 67.5 in | Wt 251.0 lb

## 2017-04-18 DIAGNOSIS — E1065 Type 1 diabetes mellitus with hyperglycemia: Secondary | ICD-10-CM

## 2017-04-18 LAB — POCT GLYCOSYLATED HEMOGLOBIN (HGB A1C): Hemoglobin A1C: 8.1

## 2017-04-18 NOTE — Patient Instructions (Addendum)
Please change the pump settings: - basal rates:  12 am: 1.5 4 am: 2.5   - ICR:  12 am: 5.0 5 pm: 4.3  8 pm: 4.3 >> 5.5 - target: 100-110 - ISF: 25 >> 40 - Active insulin time: 5h >> 4h  - Metformin 1000 mg of metformin with dinner.  Please do not overcompensate for lows (please see separate protocol sheet)  Please check sugars and bolus before every meal.  Try to add a manual bolus for any boluses above 20 units.  Please return in 3 months with your sugar log.

## 2017-04-18 NOTE — Progress Notes (Signed)
Patient ID: Kristie [name], female   DOB: January 12, 1966, 51 y.o.   MRN: [id number]  HPI: Kristie [name] is a 51 y.o.-year-old female, returning for follow-up for DM1, dx 2001 (GDM), uncontrolled, with long term complications (+DR). Last visit 3.5 mo ago.  She added Abilify to Effexor >> she has more energy and feels much beter. Eats less. Sugars are better, too.  Last hemoglobin A1c was: Lab Results  Component Value Date   HGBA1C 8.1 04/18/2017   HGBA1C 10.1 12/26/2016   HGBA1C 9.6 04/27/2016   Pt is on an insulin pump: Medtronic 723 x 3 years, w/o CGM, uses Novolin R in the pump (Novolog too expensive)  Pump settings: - basal rates:  12 am: 1.5 4 am: 2.5 - ICR:  12 am: 5.0 5 pm: 4.3 - target: 100-110 - ISF: 25 - Active insulin time: 5h  - extended bolusing: not using - changes infusion site: q3days - Meter: Bayer Contour Basal insulin tdd 44% >> 51% >> 48% >> 57% Bolus insulin tdd 56% >> 49% >> 52% >> 43%  Pt checks her sugars 4x a day and they are - 211 +/- 88 >> 160 +/- 112  - will scan reports - am:  128-288, 340 >> n/c >> 117-239 >> 96-236, 335, >400 - highs after correction of lows - 2h after b'fast: 148-245 >> 132-245 >> n/c  - before lunch: 136-266, 378 >> 71, 156-369 >> 98-171, 200 - 2h after lunch:  282, >400 >> n/c - before dinner:  1180-370 >> 46 x1, 189-256, 396 >> 41, 45, 151-299, 387 - 2h after dinner:  266, 331 >> 40, 105-227 >> <40 x1, 55-256, 347 - bedtime: 90, 213, 301 >> 208-220 >> see above - nighttime:  121 >> n/c >> 56, 378 >> 400x1 >> 221 >> n/c Lowest sugar was 37 >> ... >> 65 >> 32 (when sick) >> 30s x2, 47 x1 >> <40; she has hypoglycemia awareness at 60s. No Prev. hypogly adm. Highest sugar was HI >> 350 >> >400 >> 378 (after Chick fil-a) >> 300s (5x) >> >400 Had 1x previous DKA admission.  She was in the ED with [facility] 01/2016 >> CBG 555. No DKA.  Pt's meals are: - Breakfast: 2 eggs, toast, cheese, but most of the times she skips - Lunch:  sandwich - Dinner: backed chicken or porkchops - Snacks: yoghurt, fruit  - No CKD, last BUN/creatinine:  Lab Results  Component Value Date   BUN 25 (H) 03/26/2017   CREATININE 0.97 03/26/2017  Not on ACEI - last set of lipids: Lab Results  Component Value Date   CHOL 148 03/26/2017   HDL 43.00 03/26/2017   LDLCALC 79 03/26/2017   LDLDIRECT 167.1 04/02/2013   TRIG 133.0 03/26/2017   CHOLHDL 3 03/26/2017  On Lipitor 40; - last eye exam was in 10/2016 >> + DR. - she has numbness and tingling in her feet (mostly big toes)  Last TSH was normal: Lab Results  Component Value Date   TSH 1.27 03/26/2017   ROS: Constitutional: no weight gain/no weight loss, no fatigue, no subjective hyperthermia, no subjective hypothermia Eyes: no blurry vision, no xerophthalmia ENT: no sore throat, no nodules palpated in throat, no dysphagia, no odynophagia, no hoarseness Cardiovascular: no CP/no SOB/no palpitations/no leg swelling Respiratory: no cough/no SOB/no wheezing Gastrointestinal: no N/no V/no D/no C/no acid reflux Musculoskeletal: no muscle aches/no joint aches Skin: no rashes, no hair loss Neurological: no tremors/+ numbness/+ tingling/no dizziness  I reviewed pt's  medications, allergies, PMH, social hx, family hx, and changes were documented in the history of present illness. Otherwise, unchanged from my initial visit note.  PE: BP 118/82 (BP Location: Right Arm, Patient Position: Sitting)   Pulse 96   Ht 5' 7.5" (1.715 m)   Wt 251 lb (113.9 kg)   SpO2 97%   BMI 38.73 kg/m  Body mass index is 38.73 kg/m.  Wt Readings from Last 3 Encounters:  04/18/17 251 lb (113.9 kg)  03/26/17 246 lb 1.9 oz (111.6 kg)  12/26/16 248 lb (112.5 kg)   Constitutional: overweight, in NAD Eyes: PERRLA, EOMI, no exophthalmos ENT: moist mucous membranes, no thyromegaly, no cervical lymphadenopathy Cardiovascular: RRR, No MRG Respiratory: CTA B Gastrointestinal: abdomen soft, NT, ND,  BS+ Musculoskeletal: no deformities, strength intact in all 4 Skin: moist, warm, no rashes Neurological: no tremor with outstretched hands, DTR normal in all 4  ASSESSMENT: 1. DM1, uncontrolled, with complications (?PN) - DR  - on pump Component     Latest Ref Rng 09/24/2013  Glucose, Fasting     70 - 99 mg/dL 121 (H)  C-Peptide     0.80 - 3.90 ng/mL <0.10 (L)  - Needs 4 vials of insulin a month   PLAN:  1. Patient with long-standing, uncontrolled DM1, now finally with improved control after starting Metformin at last visit, changing the pump settings since last visit, and also adding Abilify. She now has lows mostly at night.  - we discussed to increase her ICR after 8 pm and also increase her ISF as her lows appear to happen after correction of a low CBG - we also discussed not to overcompensate for lows and I reviewed with her the 15-15 rule to correct hypoglycemia - she is doing a better job entering her sugars and her carbs in the pump and bolusing with meals - at next visit, we may need to increase Metformin to 1000 mg bid, but for now will continue same dose - I advised her to: Patient Instructions  Please change the pump settings: - basal rates:  12 am: 1.5 4 am: 2.5   - ICR:  12 am: 5.0 5 pm: 4.3  8 pm: 4.3 >> 5.5 - target: 100-110 - ISF: 25 >> 40 - Active insulin time: 5h >> 4h  - Metformin 1000 mg of metformin with dinner.  Please do not overcompensate for lows (please see separate protocol sheet)  Please check sugars and bolus before every meal.  Try to add a manual bolus for any boluses above 20 units.  Please return in 3 months with your sugar log.   - today, HbA1c is 8.1% (better) - continue checking sugars at different times of the day - check 4x a day, rotating checks - advised for yearly eye exams >> she is UTD - Return to clinic in 3 mo with sugar log   - time spent with the patient: 40 min, of which >50% was spent in reviewing her pump downloads  (will scan), discussing her hypo- and hyper-glycemic episodes, reviewing previous labs and pump settings and developing a plan to avoid hypo- and hyper-glycemia.   Philemon Kingdom, MD PhD [facility]

## 2017-04-30 ENCOUNTER — Ambulatory Visit (INDEPENDENT_AMBULATORY_CARE_PROVIDER_SITE_OTHER): Payer: PPO | Admitting: Family Medicine

## 2017-04-30 ENCOUNTER — Encounter: Payer: Self-pay | Admitting: Family Medicine

## 2017-04-30 VITALS — BP 107/69 | HR 81 | Temp 97.8°F | Resp 16 | Ht 67.5 in | Wt 250.0 lb

## 2017-04-30 DIAGNOSIS — F411 Generalized anxiety disorder: Secondary | ICD-10-CM | POA: Diagnosis not present

## 2017-04-30 DIAGNOSIS — F331 Major depressive disorder, recurrent, moderate: Secondary | ICD-10-CM | POA: Diagnosis not present

## 2017-04-30 MED ORDER — ARIPIPRAZOLE 10 MG PO TABS: 10.0000 mg | ORAL_TABLET | Freq: Every day | ORAL | 1 refills | Status: DC

## 2017-04-30 NOTE — Patient Instructions (Signed)
[place] in [place]: [phone] Or [phone].  Tell them a psychology referral has been ordered by me and tell them you want to set up appointment with Dr. Gaynell Face at [facility] location.
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
## 2017-05-03 ENCOUNTER — Telehealth: Payer: Self-pay

## 2017-05-03 NOTE — Telephone Encounter (Signed)
LM advising patient a Cologuard representative will be calling patient with additional instructions on collection process. Once they speak to patient, a second kit will be mailed out (first collection inadequate).

## 2017-05-08 ENCOUNTER — Ambulatory Visit (INDEPENDENT_AMBULATORY_CARE_PROVIDER_SITE_OTHER): Payer: PPO | Admitting: Clinical

## 2017-05-08 DIAGNOSIS — F4323 Adjustment disorder with mixed anxiety and depressed mood: Secondary | ICD-10-CM | POA: Diagnosis not present

## 2017-05-17 ENCOUNTER — Ambulatory Visit: Payer: PPO | Admitting: Clinical

## 2017-05-20 ENCOUNTER — Other Ambulatory Visit: Payer: Self-pay | Admitting: Family Medicine

## 2017-05-20 ENCOUNTER — Other Ambulatory Visit: Payer: Self-pay | Admitting: Internal Medicine

## 2017-05-20 NOTE — Telephone Encounter (Signed)
Rx faxed

## 2017-05-20 NOTE — Telephone Encounter (Signed)
[facility].  RF request for clonazepam LOV: 04/30/17 Next ov: 06/12/17 Last written: 11/19/16 #120 w/ 5RF  Please advise. Thanks.

## 2017-05-21 DIAGNOSIS — E1065 Type 1 diabetes mellitus with hyperglycemia: Secondary | ICD-10-CM | POA: Diagnosis not present

## 2017-06-11 LAB — COLOGUARD: Cologuard: NEGATIVE

## 2017-06-12 ENCOUNTER — Encounter: Payer: Self-pay | Admitting: Family Medicine

## 2017-06-12 ENCOUNTER — Other Ambulatory Visit: Payer: Self-pay

## 2017-06-12 ENCOUNTER — Ambulatory Visit: Payer: PPO | Admitting: Family Medicine

## 2017-06-12 VITALS — BP 111/77 | HR 83 | Temp 97.5°F | Resp 16 | Ht 67.5 in | Wt 247.5 lb

## 2017-06-12 DIAGNOSIS — F3340 Major depressive disorder, recurrent, in remission, unspecified: Secondary | ICD-10-CM | POA: Diagnosis not present

## 2017-06-12 DIAGNOSIS — F411 Generalized anxiety disorder: Secondary | ICD-10-CM

## 2017-06-12 MED ORDER — ARIPIPRAZOLE 10 MG PO TABS: 10.0000 mg | ORAL_TABLET | Freq: Every day | ORAL | 1 refills | Status: DC
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
## 2017-06-18 ENCOUNTER — Encounter: Payer: Self-pay | Admitting: Family Medicine

## 2017-06-18 ENCOUNTER — Telehealth: Payer: Self-pay | Admitting: Family Medicine

## 2017-06-18 NOTE — Telephone Encounter (Signed)
Pls notify pt that their cologuard stool test came back NEGATIVE.  This is good. We'll repeat this test in 3 yrs.

## 2017-06-18 NOTE — Telephone Encounter (Signed)
Left message for pt to call back.   Okay for PEC to discuss results/PCP recommendations.   

## 2017-06-21 NOTE — Telephone Encounter (Signed)
Left message for pt to call back.   Okay for PEC to discuss results/PCP recommendations.   

## 2017-07-01 ENCOUNTER — Telehealth: Payer: Self-pay | Admitting: Family Medicine

## 2017-07-01 NOTE — Telephone Encounter (Signed)
Left message for pt to call back or check mychart.   Okay for PEC to discuss results/PCP recommendations.

## 2017-07-01 NOTE — Telephone Encounter (Signed)
Phone call from pt.  Requested Cologuard results.  Informed of negative results and of Dr. Rulon Sera recommendation to repeat in 3 years.  Verb. Understanding.

## 2017-07-04 ENCOUNTER — Encounter: Payer: Self-pay | Admitting: *Deleted

## 2017-07-04 NOTE — Telephone Encounter (Signed)
Letter mailed to pt.  

## 2017-07-10 ENCOUNTER — Other Ambulatory Visit: Payer: Self-pay

## 2017-07-10 NOTE — Patient Outreach (Signed)
[facility]) Care Management  07/10/2017  Kristie [name] [birth year] [id number]   Telephone call to patient for [employer] referral. Patient able to verify HIPAA.  Discussed with patient visit from nurse practitioner.  Patient reports she is working on referrals with her physician but has had personal issues that has kept her from taking care of these referrals.  She states she does have a gynecologist appointment in January and working on the eye doctor.  Encouraged patient to talk with her physician about the heart doctor and possible urologist.    RN CM did give patient names of dentist nearby.  She states she will be calling as her husband needs a dentist as well.    Discussed with patient [place]. Patient declined services but agreed to receive brochure.  She states she is working on her A1c and it was down to 8.1 on last visit.  She hopes on her next appointment to be around 7.0.    Plan: RN CM will close case and notify care management assistant of case status.    [name], RN, MSN [facility] Care Management Care Management Coordinator Direct Line [phone] Toll Free: [phone]  Fax: [phone]

## 2017-07-24 ENCOUNTER — Ambulatory Visit (INDEPENDENT_AMBULATORY_CARE_PROVIDER_SITE_OTHER): Payer: PPO | Admitting: Internal Medicine

## 2017-07-24 ENCOUNTER — Encounter: Payer: Self-pay | Admitting: Internal Medicine

## 2017-07-24 VITALS — BP 110/80 | HR 109 | Ht 67.5 in | Wt 251.2 lb

## 2017-07-24 DIAGNOSIS — E1065 Type 1 diabetes mellitus with hyperglycemia: Secondary | ICD-10-CM

## 2017-07-24 LAB — POCT GLYCOSYLATED HEMOGLOBIN (HGB A1C): HEMOGLOBIN A1C: 10

## 2017-07-24 MED ORDER — METFORMIN HCL 1000 MG PO TABS
1000.0000 mg | ORAL_TABLET | Freq: Every day | ORAL | 3 refills | Status: DC
Start: 2017-07-24 — End: 2018-09-23

## 2017-07-24 NOTE — Progress Notes (Signed)
Patient ID: Kristie [name], female   DOB: 1965-10-04, 52 y.o.   MRN: [id number]  HPI: Kristie [name] is a 52 y.o.-year-old female, returning for follow-up for DM1, dx 2001 (GDM), uncontrolled, with long term complications (+DR). Last visit 3 mo ago.  At this visit, sugars are worse after the Holidays. She decreased her insulin to carb ratios since last visit to help with her increased CBGs.  Last hemoglobin A1c was: Lab Results  Component Value Date   HGBA1C 8.1 04/18/2017   HGBA1C 10.1 12/26/2016   HGBA1C 9.6 04/27/2016   Pt is on an insulin pump: Medtronic Revel 723 x more than 3 years years, without CGM, uses Novolin R in the pump (Novolog too expensive)  Pump settings: - basal rates:  12 am: 1.5 4 am: 2.5 - ICR:  12 am: 5.0 >> 3.0 5 pm: 4.3  >> 3.5 8 pm: 4.3 >> 5.5 >> 2.7 - target: 100-110 - ISF: 25 >> 40 - Active insulin time: 5h >> 4h  - Metformin 1000 mg of metformin with dinner. - extended bolusing: not using - changes infusion site: q2days - Meter: Bayer Contour Basal insulin tdd 44% >> 51% >> 48% >> 57% >> 56% Bolus insulin tdd 56% >> 49% >> 52% >> 43% >> 44%  Pt checks her sugars 1-4x a day and they are - 211 +/- 88 >> 160 +/- 112 >> 214 +/- 112- will scan reports - am:  117-239 >> 96-236, 335, >400 >> 70, 181-393, >400 - 2h after b'fast: 148-245 >> 132-245 >> n/c >> 101 x1 - before lunch:  71, 156-369 >> 98-171, 200 >> 168-200 - 2h after lunch:  282, >400 >> n/c >> 89 x1, >400 - before dinner:  46 x1, 189-256, 396 >> 41, 45, 151-299, 387 >> 111-300 - 2h after dinner:  266, 331 >> 40, 105-227 >> <40 x1, 55-256, 347 >> n/c - bedtime: 90, 213, 301 >> 208-220 >> see above - nighttime:  121 >> n/c >> 56, 378 >> 400x1 >> 221 >> n/c >> 41 x1 Lowest sugar was <40 >> 41 (after a large insulin dose with dinner); + hypoglycemia awareness in the 60s. No prev. hypogly adm. Highest sugar was >400 >> >400 Had 1x previous DKA admission.  She was in [facility] ED [facility] 01/[facility]  >> CBG 555. No DKA.  Pt's meals are: - Breakfast: 2 eggs, toast, cheese, but most of the times she skips - Lunch: sandwich - Dinner: backed chicken or porkchops - Snacks: yoghurt, fruit  - No CKD, last BUN/creatinine:  Lab Results  Component Value Date   BUN 25 (H) 03/26/2017   CREATININE 0.97 03/26/2017  Not on ACEI/ARB. - + HL; last set of lipids: Lab Results  Component Value Date   CHOL 148 03/26/2017   HDL 43.00 03/26/2017   LDLCALC 79 03/26/2017   LDLDIRECT 167.1 04/02/2013   TRIG 133.0 03/26/2017   CHOLHDL 3 03/26/2017  On Lipitor 40. - last eye exam was in 10/2016 >> + DR - + numbness and tingling in her feet (mostly big toes)  Last TSH normal: Lab Results  Component Value Date   TSH 1.27 03/26/2017   ROS: Constitutional: no weight gain/no weight loss, no fatigue, no subjective hyperthermia, no subjective hypothermia Eyes: no blurry vision, no xerophthalmia ENT: no sore throat, no nodules palpated in throat, no dysphagia, no odynophagia, no hoarseness Cardiovascular: no CP/no SOB/no palpitations/no leg swelling Respiratory: no cough/no SOB/no wheezing Gastrointestinal: no N/no V/no D/no  C/no acid reflux Musculoskeletal: no muscle aches/no joint aches Skin: no rashes, no hair loss Neurological: no tremors/+ numbness/+ tingling/no dizziness  I reviewed pt's medications, allergies, PMH, social hx, family hx, and changes were documented in the history of present illness. Otherwise, unchanged from my initial visit note.  PE: BP 110/80   Pulse (!) 109   Ht 5' 7.5" (1.715 m)   Wt 251 lb 3.2 oz (113.9 kg)   SpO2 96%   BMI 38.76 kg/m  Body mass index is 38.76 kg/m.  Wt Readings from Last 3 Encounters:  07/24/17 251 lb 3.2 oz (113.9 kg)  06/12/17 247 lb 8 oz (112.3 kg)  04/30/17 250 lb (113.4 kg)   Constitutional: Obese, in NAD Eyes: PERRLA, EOMI, no exophthalmos ENT: moist mucous membranes, no thyromegaly, no cervical lymphadenopathy Cardiovascular:  tachycardia, RR, No MRG Respiratory: CTA B Gastrointestinal: abdomen soft, NT, ND, BS+ Musculoskeletal: no deformities, strength intact in all 4 Skin: moist, warm, no rashes Neurological: no tremor with outstretched hands, DTR normal in all 4  ASSESSMENT: 1. DM1, uncontrolled, with complications (?PN) - DR  - on pump Component     Latest Ref Rng 09/24/2013  Glucose, Fasting     70 - 99 mg/dL 121 (H)  C-Peptide     0.80 - 3.90 ng/mL <0.10 (L)  - Needs 4 vials of insulin a month   PLAN:  1. Patient with long standing, uncontrolled DM1, with improvement in DM control at last visit, but now worsened again over the [place]. Sugars are higher in am and also before dinner, but they decrease abruptly after meals. She decreased her ICRs since last visit and I believe they are too drastic for her.  She appears to be getting more insulin from basal rates then from boluses, however, she is not bolusing frequently enough, sometimes only once a day, so I believe that she would benefit from increasing the basal rates at this point and decreasing her ICRs.  I feel that the sensitivity factor is not the cause for her sleep decrease in sugars after bolusing,  as this is set at 40 now. - She is otherwise doing a good job  changing her infusion sites every 2 days.   - She is not bolusing before every meal, as mentioned above, so I strongly advised her to start doing so.   - Also, she is mainly bolusing after she started eating as she forgets to do it before.  We discussed about the importance of starting the bolus 15-20 minutes before she actually starts eating.  I explained how her postprandial sugars change depending on the time of the bolus. - To improve her insulin sensitivity, I also advised her to increase metformin to twice a day.  She is tolerating metformin well. - I advised her to: Patient Instructions  Please continue: - basal rates:  12 am: 1.5 >> 2.0 4 am: 2.5 >> 3.0 - ICR:  12 am: 3.0 >>  4.5 5 pm: 3.5 >> 4.5 8 pm: 2.7 >> 5 - target: 100-110 - ISF: 40 - Active insulin time: 4h  Please increase: - Metformin 1000 mg of metformin 2x a day  Please check sugars and bolus before every meal.  Try to add a manual bolus for any boluses above 20 units.  Please return in 3 months with your sugar log.   - today, HbA1c is 10% (higher) - continue checking sugars at different times of the day - check 4x a day, rotating checks -  advised for yearly eye exams >> she is UTD - Return to clinic in 3 mo with sugar log   - time spent with the patient: 40 min, of which >50% was spent in reviewing her pump downloads, discussing her hypo- and hyper-glycemic episodes, reviewing previous labs and pump settings and developing a plan to avoid hypo- and hyper-glycemia.   Philemon Kingdom, MD PhD [facility] Endocrinology

## 2017-07-24 NOTE — Patient Instructions (Addendum)
Please continue: - basal rates:  12 am: 1.5 >> 2.0 4 am: 2.5 >> 3.0 - ICR:  12 am: 3.0 >> 4.5 5 pm: 3.5 >> 4.5 8 pm: 2.7 >> 5 - target: 100-110 - ISF: 40 - Active insulin time: 4h  Please increase: - Metformin 1000 mg of metformin 2x a day  Please check sugars and bolus before every meal.  Try to add a manual bolus for any boluses above 20 units.  Please return in 3 months with your sugar log.  Marland Kitchen

## 2017-07-24 NOTE — Addendum Note (Signed)
Addended by: Drucilla Schmidt on: 07/24/2017 01:22 PM   Modules accepted: Orders

## 2017-07-31 DIAGNOSIS — Z01419 Encounter for gynecological examination (general) (routine) without abnormal findings: Secondary | ICD-10-CM | POA: Diagnosis not present

## 2017-09-13 ENCOUNTER — Ambulatory Visit: Payer: Self-pay | Admitting: Family Medicine

## 2017-09-17 ENCOUNTER — Encounter: Payer: Self-pay | Admitting: Family Medicine

## 2017-09-17 ENCOUNTER — Ambulatory Visit (INDEPENDENT_AMBULATORY_CARE_PROVIDER_SITE_OTHER): Payer: PPO | Admitting: Family Medicine

## 2017-09-17 VITALS — BP 120/64 | HR 90 | Resp 16 | Ht 67.5 in | Wt 215.0 lb

## 2017-09-17 DIAGNOSIS — E669 Obesity, unspecified: Secondary | ICD-10-CM | POA: Diagnosis not present

## 2017-09-17 DIAGNOSIS — I10 Essential (primary) hypertension: Secondary | ICD-10-CM | POA: Diagnosis not present

## 2017-09-17 DIAGNOSIS — F411 Generalized anxiety disorder: Secondary | ICD-10-CM | POA: Diagnosis not present

## 2017-09-17 DIAGNOSIS — E7800 Pure hypercholesterolemia, unspecified: Secondary | ICD-10-CM

## 2017-09-17 DIAGNOSIS — F3342 Major depressive disorder, recurrent, in full remission: Secondary | ICD-10-CM

## 2017-09-17 LAB — LIPID PANEL
CHOL/HDL RATIO: 3
Cholesterol: 119 mg/dL (ref 0–200)
HDL: 38.9 mg/dL — ABNORMAL LOW (ref >39.00)
LDL Cholesterol: 51 mg/dL (ref 0–99)
NONHDL: 80.16
Triglycerides: 145 mg/dL (ref 0.0–149.0)
VLDL: 29 mg/dL (ref 0.0–40.0)

## 2017-09-17 LAB — COMPREHENSIVE METABOLIC PANEL
ALK PHOS: 138 U/L — AB (ref 39–117)
ALT: 25 U/L (ref 0–35)
AST: 10 U/L (ref 0–37)
Albumin: 3.7 g/dL (ref 3.5–5.2)
BUN: 17 mg/dL (ref 6–23)
CHLORIDE: 100 meq/L (ref 96–112)
CO2: 28 meq/L (ref 19–32)
Calcium: 9.2 mg/dL (ref 8.4–10.5)
Creatinine, Ser: 0.95 mg/dL (ref 0.40–1.20)
GFR: 65.71 mL/min (ref >60.00)
GLUCOSE: 437 mg/dL — AB (ref 70–99)
POTASSIUM: 4.9 meq/L (ref 3.5–5.1)
SODIUM: 134 meq/L — AB (ref 135–145)
Total Bilirubin: 1 mg/dL (ref 0.2–1.2)
Total Protein: 6.4 g/dL (ref 6.0–8.3)
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
## 2017-09-21 ENCOUNTER — Other Ambulatory Visit: Payer: Self-pay | Admitting: Internal Medicine

## 2017-09-21 ENCOUNTER — Other Ambulatory Visit: Payer: Self-pay | Admitting: Family Medicine

## 2017-10-10 ENCOUNTER — Other Ambulatory Visit: Payer: Self-pay | Admitting: Family Medicine

## 2017-10-10 DIAGNOSIS — Z1231 Encounter for screening mammogram for malignant neoplasm of breast: Secondary | ICD-10-CM

## 2017-10-22 ENCOUNTER — Other Ambulatory Visit: Payer: Self-pay | Admitting: Internal Medicine

## 2017-10-22 ENCOUNTER — Ambulatory Visit: Payer: Self-pay | Admitting: Internal Medicine

## 2017-10-25 ENCOUNTER — Other Ambulatory Visit: Payer: Self-pay

## 2017-11-14 ENCOUNTER — Ambulatory Visit
Admission: RE | Admit: 2017-11-14 | Discharge: 2017-11-14 | Disposition: A | Discharge status: Home / Self Care | Payer: PPO | Source: Ambulatory Visit | Attending: Family Medicine | Admitting: Family Medicine

## 2017-11-14 DIAGNOSIS — Z1231 Encounter for screening mammogram for malignant neoplasm of breast: Secondary | ICD-10-CM | POA: Diagnosis not present

## 2017-11-26 ENCOUNTER — Other Ambulatory Visit: Payer: Self-pay | Admitting: Family Medicine

## 2017-11-26 NOTE — Telephone Encounter (Signed)
[facility]  RF request for clonazepam LOV: 09/17/17 Next ov: 03/21/18 Last written: 05/20/17 #120 w/ 5RF  Please advise. Thanks.

## 2017-12-26 ENCOUNTER — Ambulatory Visit: Payer: Self-pay | Admitting: Internal Medicine

## 2018-01-30 ENCOUNTER — Encounter: Payer: Self-pay | Admitting: Internal Medicine

## 2018-01-30 ENCOUNTER — Ambulatory Visit (INDEPENDENT_AMBULATORY_CARE_PROVIDER_SITE_OTHER): Payer: PPO | Admitting: Internal Medicine

## 2018-01-30 VITALS — BP 130/78 | HR 94 | Ht 67.5 in | Wt 258.0 lb

## 2018-01-30 DIAGNOSIS — E1065 Type 1 diabetes mellitus with hyperglycemia: Secondary | ICD-10-CM | POA: Diagnosis not present

## 2018-01-30 LAB — POCT GLYCOSYLATED HEMOGLOBIN (HGB A1C): Hemoglobin A1C: 8.3 % — AB (ref 4.0–5.6)

## 2018-01-30 NOTE — Addendum Note (Signed)
Addended by: Drucilla Schmidt on: 01/30/2018 02:36 PM   Modules accepted: Orders
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
## 2018-01-30 NOTE — Patient Instructions (Addendum)
Please continue: - Metformin 1000 mg 2x a day  Please change the pump settings as follows: - basal rates:  12 am: 2.0 4 am: 3.0 12 pm: 2.5 - ICR:  12 am: 3.3 5 pm: 3.3 8 pm: 3.0 - target: 100-110 - ISF: 40 - Active insulin time: 4h  Please do the following approximately 15 minutes before every meal: - Enter carbs (C) - Enter sugars (S) - Start insulin bolus (I)  Try to eat b'fast and bolus for it.  Use manual boluses for any bolus or above 20 units.  Please return in 3 months with your sugar log.

## 2018-03-04 ENCOUNTER — Telehealth: Payer: Self-pay | Admitting: Internal Medicine

## 2018-03-04 NOTE — Telephone Encounter (Signed)
Faxed today at 1:43pm

## 2018-03-04 NOTE — Telephone Encounter (Signed)
[facility] is needing latest office notes faxed over to their office in order for them to help patient get her insulin pump.   Fax- [phone]

## 2018-03-10 ENCOUNTER — Telehealth: Payer: Self-pay | Admitting: Internal Medicine

## 2018-03-10 DIAGNOSIS — E1065 Type 1 diabetes mellitus with hyperglycemia: Secondary | ICD-10-CM

## 2018-03-10 NOTE — Telephone Encounter (Signed)
Patient is requesting a Peptide Test. Please call ph# [phone] to advise.

## 2018-03-10 NOTE — Telephone Encounter (Signed)
Ordered called pt but had to LVM

## 2018-03-10 NOTE — Telephone Encounter (Signed)
Please advise on below  

## 2018-03-10 NOTE — Telephone Encounter (Signed)
C, Can you please order a C-peptide and a Glucose, fasting and ask her to come and have both drawn. She only needs to come if sugars are <180, as OTW, her C peptide is not accurate.

## 2018-03-11 NOTE — Telephone Encounter (Signed)
Spoke with pt on  03/10/18. She is aware and will call the day she plans to come in to make an appointment

## 2018-03-12 ENCOUNTER — Other Ambulatory Visit (INDEPENDENT_AMBULATORY_CARE_PROVIDER_SITE_OTHER): Payer: PPO

## 2018-03-12 DIAGNOSIS — E1065 Type 1 diabetes mellitus with hyperglycemia: Secondary | ICD-10-CM | POA: Diagnosis not present

## 2018-03-12 LAB — GLUCOSE, RANDOM: GLUCOSE: 136 mg/dL — AB (ref 70–99)

## 2018-03-14 LAB — C-PEPTIDE: C-Peptide: <0.1 ng/mL — ABNORMAL LOW (ref 0.80–3.85)

## 2018-03-21 ENCOUNTER — Encounter: Payer: Self-pay | Admitting: Family Medicine

## 2018-03-26 ENCOUNTER — Other Ambulatory Visit: Payer: Self-pay | Admitting: Family Medicine

## 2018-03-26 ENCOUNTER — Other Ambulatory Visit: Payer: Self-pay | Admitting: Internal Medicine

## 2018-04-01 DIAGNOSIS — E1065 Type 1 diabetes mellitus with hyperglycemia: Secondary | ICD-10-CM | POA: Diagnosis not present

## 2018-04-05 ENCOUNTER — Encounter (HOSPITAL_COMMUNITY): Payer: Self-pay

## 2018-04-05 ENCOUNTER — Emergency Department (HOSPITAL_COMMUNITY)
Admission: EM | Admit: 2018-04-05 | Discharge: 2018-04-05 | Disposition: A | Discharge status: Home / Self Care | Payer: PPO | Attending: Emergency Medicine | Admitting: Emergency Medicine

## 2018-04-05 ENCOUNTER — Other Ambulatory Visit: Payer: Self-pay

## 2018-04-05 DIAGNOSIS — E162 Hypoglycemia, unspecified: Secondary | ICD-10-CM

## 2018-04-05 DIAGNOSIS — E161 Other hypoglycemia: Secondary | ICD-10-CM | POA: Diagnosis not present

## 2018-04-05 DIAGNOSIS — Z79899 Other long term (current) drug therapy: Secondary | ICD-10-CM | POA: Diagnosis not present

## 2018-04-05 DIAGNOSIS — I10 Essential (primary) hypertension: Secondary | ICD-10-CM | POA: Insufficient documentation

## 2018-04-05 DIAGNOSIS — E10649 Type 1 diabetes mellitus with hypoglycemia without coma: Secondary | ICD-10-CM | POA: Insufficient documentation

## 2018-04-05 DIAGNOSIS — R4781 Slurred speech: Secondary | ICD-10-CM | POA: Diagnosis not present

## 2018-04-05 DIAGNOSIS — E11649 Type 2 diabetes mellitus with hypoglycemia without coma: Secondary | ICD-10-CM | POA: Diagnosis not present

## 2018-04-05 DIAGNOSIS — R531 Weakness: Secondary | ICD-10-CM | POA: Diagnosis not present

## 2018-04-05 LAB — BASIC METABOLIC PANEL
Anion gap: 11 (ref 5–15)
BUN: 15 mg/dL (ref 6–20)
CALCIUM: 8.6 mg/dL — AB (ref 8.9–10.3)
CO2: 21 mmol/L — AB (ref 22–32)
CREATININE: 0.92 mg/dL (ref 0.44–1.00)
Chloride: 102 mmol/L (ref 98–111)
GLUCOSE: 305 mg/dL — AB (ref 70–99)
Potassium: 4.4 mmol/L (ref 3.5–5.1)
Sodium: 134 mmol/L — ABNORMAL LOW (ref 135–145)

## 2018-04-05 LAB — CBC WITH DIFFERENTIAL/PLATELET
BASOS PCT: 0 %
Basophils Absolute: 0 10*3/uL (ref 0.0–0.1)
Eosinophils Absolute: 0 10*3/uL (ref 0.0–0.7)
Eosinophils Relative: 0 %
HCT: 40.1 % (ref 36.0–46.0)
Hemoglobin: 13.1 g/dL (ref 12.0–15.0)
Lymphocytes Relative: 12 %
Lymphs Abs: 1.6 10*3/uL (ref 0.7–4.0)
MCH: 28.8 pg (ref 26.0–34.0)
MCHC: 32.7 g/dL (ref 30.0–36.0)
MCV: 88.1 fL (ref 78.0–100.0)
MONOS PCT: 8 %
Monocytes Absolute: 1.1 10*3/uL — ABNORMAL HIGH (ref 0.1–1.0)
NEUTROS PCT: 80 %
Neutro Abs: 10.7 10*3/uL — ABNORMAL HIGH (ref 1.7–7.7)
Platelets: 290 10*3/uL (ref 150–400)
RBC: 4.55 MIL/uL (ref 3.87–5.11)
RDW: 13 % (ref 11.5–15.5)
WBC: 13.5 10*3/uL — ABNORMAL HIGH (ref 4.0–10.5)

## 2018-04-05 LAB — CBG MONITORING, ED
GLUCOSE-CAPILLARY: 145 mg/dL — AB (ref 70–99)
Glucose-Capillary: 321 mg/dL — ABNORMAL HIGH (ref 70–99)
Glucose-Capillary: 491 mg/dL — ABNORMAL HIGH (ref 70–99)

## 2018-04-05 NOTE — ED Notes (Signed)
Dr. Roxanne Mins notified that blood glucose was 321.

## 2018-04-05 NOTE — Discharge Instructions (Addendum)
TAlk with your endocrinologist regarding suggestions on how to adjust your insulin pump.

## 2018-04-05 NOTE — ED Notes (Signed)
Patient drinking oral fluids at this time
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
## 2018-04-05 NOTE — ED Triage Notes (Signed)
Pt arrives from home via [facility] c/o hypoglycemia. [facility] reports Pts CBG at their arrival was 30.  [facility] reports being on scene for 60 minutes working with Pt to get CBG up. Pt reports turning her Insulin Pump off this morning stating it has been malfunctioning. Upon arrival in [facility] CBG reads 145. Pt reports needing a new Insulin Pump but due to insurance reasons it wont ship out till Oct. 2nd 2019.

## 2018-04-05 NOTE — ED Notes (Signed)
Dr. Roxanne Mins notified about blood glucose range of 491.

## 2018-04-08 ENCOUNTER — Telehealth: Payer: Self-pay | Admitting: Nutrition

## 2018-04-08 NOTE — Telephone Encounter (Signed)
Patient reports that Saturday night, at 11PM was when her husband noticed her blood sugar was low.  She changed her infusion set at 9PM and her blood sugar was 227 3 hours pcS.  She ate popcorn with no bolus.  At 11PM husband found her sweaty and fed her some peanut butter crackers, some very sweet lemonaid, nad some eggs.  1/2 hour later, her blood sugar was still in the 30s.  He called EMS and they gave her 3 glucose tubes and after 30 min. The blood sugar rose to 168.  She walked to the ambulance and it was 195 when she got in, and in 15 min. It dropped to 145.  They arrived at the hospital at that time. I asked if she primed the infusion set with it attached to her, or remember if she gave herself some insulin.  She said not to both of those questions.   No low blood sugars since then.

## 2018-04-08 NOTE — Telephone Encounter (Signed)
Thank you, Kristie [name]! Let's have her change her pump as soon as possible so when I see her next week we will have few days on the new pump.

## 2018-04-08 NOTE — Telephone Encounter (Signed)
Message left on machine to call the pump representative when she receives her new pump for training.  She was given her telephone number.  She was also told to stay on the new pump for 1 week before seeing Dr. Cruzita Lederer.

## 2018-04-09 NOTE — Telephone Encounter (Signed)
Message left on machine.  See newer note

## 2018-04-17 ENCOUNTER — Ambulatory Visit: Payer: PPO | Admitting: Internal Medicine

## 2018-04-24 DIAGNOSIS — E1065 Type 1 diabetes mellitus with hyperglycemia: Secondary | ICD-10-CM | POA: Diagnosis not present

## 2018-04-25 ENCOUNTER — Other Ambulatory Visit: Payer: Self-pay | Admitting: Internal Medicine

## 2018-05-25 DIAGNOSIS — E1065 Type 1 diabetes mellitus with hyperglycemia: Secondary | ICD-10-CM | POA: Diagnosis not present

## 2018-05-26 ENCOUNTER — Other Ambulatory Visit: Payer: Self-pay | Admitting: Internal Medicine

## 2018-05-27 NOTE — Telephone Encounter (Signed)
[place] patient  

## 2018-06-24 ENCOUNTER — Other Ambulatory Visit: Payer: Self-pay | Admitting: Internal Medicine

## 2018-06-24 ENCOUNTER — Other Ambulatory Visit: Payer: Self-pay | Admitting: Family Medicine

## 2018-06-24 DIAGNOSIS — E1065 Type 1 diabetes mellitus with hyperglycemia: Secondary | ICD-10-CM | POA: Diagnosis not present

## 2018-06-24 NOTE — Telephone Encounter (Signed)
RF request for clonazepam LOV: 09/17/17 Next ov: None Last written: 11/26/17 #120 w/ 5RF  Please advise. Thanks.

## 2018-06-25 NOTE — Telephone Encounter (Signed)
I'll do 90 day supply, no RF. She is overdue for follow up: needs to make appt for CPE (if she is unable to come in for CPE, then she at least needs a f/u for her anxiety and the clonazepam.-thx

## 2018-06-26 NOTE — Telephone Encounter (Signed)
L/M for pt that her rx is ready for pick up.

## 2018-07-01 NOTE — Telephone Encounter (Addendum)
Pt advised and voiced understanding.    Apt for CPE made for 07/23/18 at 9:30am.

## 2018-07-18 ENCOUNTER — Other Ambulatory Visit: Payer: Self-pay | Admitting: Family Medicine

## 2018-07-23 ENCOUNTER — Encounter: Payer: Self-pay | Admitting: Family Medicine
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
## 2018-07-25 DIAGNOSIS — E1065 Type 1 diabetes mellitus with hyperglycemia: Secondary | ICD-10-CM | POA: Diagnosis not present

## 2018-08-25 DIAGNOSIS — E1065 Type 1 diabetes mellitus with hyperglycemia: Secondary | ICD-10-CM | POA: Diagnosis not present

## 2018-09-01 ENCOUNTER — Telehealth: Payer: Self-pay | Admitting: Internal Medicine

## 2018-09-01 NOTE — Telephone Encounter (Signed)
Patient called the [facility] she states she has a new insulin pump for 2 month and her Glucose readings are too high she called supplies and states she needs to schedule appt to set pump to dispense more insulin

## 2018-09-02 NOTE — Telephone Encounter (Signed)
Message left on voicemail of times when I can see her today or tomorrow.

## 2018-09-02 NOTE — Telephone Encounter (Signed)
Pt. Called back and schedule to come in tomorrow AM

## 2018-09-02 NOTE — Telephone Encounter (Signed)
Kristie [name], if you cannot see her today or tomorrow, that would be wonderful.  I am looking at my schedule and I do not see openings.Marland KitchenMarland Kitchen

## 2018-09-02 NOTE — Telephone Encounter (Signed)
Thank you so much for [facility]!

## 2018-09-03 ENCOUNTER — Other Ambulatory Visit: Payer: Self-pay

## 2018-09-03 ENCOUNTER — Encounter: Payer: PPO | Attending: Internal Medicine | Admitting: Nutrition

## 2018-09-03 DIAGNOSIS — E1065 Type 1 diabetes mellitus with hyperglycemia: Secondary | ICD-10-CM | POA: Insufficient documentation

## 2018-09-03 NOTE — Progress Notes (Signed)
This patient was started on an insulin pump by Arnetha Courser from [employer] 2 months ago.  She called yesterday saying blood sugars were running in the high 200s to 300s.  Pump download shows all blood sugar readings in last 2 weeks over 200, up to 400, except for 2.   Patient does not count carbs, but instead uses exchanges;  When she looks at a label, she divides the total carbs by 15 to get the number of exchanges.  I explained that this is like counting carbs, but she did not want to change what she has been doing.   She admits to not putting in exchanges for some small amounts of snacking she is doing during the day.  I explained that she can enter 0.5 exchanges, and she agreed to do this She is also subtracting the total bolus insulin dose from the amount of IOB.  I explained that the pump already does this.  She is also thinking that she can not eat when she has IOB.  This misbelief was corrected also. We reviewed again how to bolus, and the need to bolus for all foods going in.  She reported good understanding of this, with no final questions. She was given a sheet to sign up for [facility] and she did this from her phone.  She was linked to our system, and shown how to download from home.  She will do this in one week. Per Dr. Arman Filter written instructions on the download, her ISF was changed to 25.  She had no final questions.

## 2018-09-04 NOTE — Patient Instructions (Addendum)
Bolus for all snacks Do not subtract the insulin on board from your bolus total.  The pump has already done this. Download pump in one week, and call to tell us that you have done this.

## 2018-09-05 ENCOUNTER — Other Ambulatory Visit: Payer: Self-pay | Admitting: Pharmacist

## 2018-09-05 NOTE — Patient Outreach (Signed)
[facility]) Care Management  [facility] - Medication Adherence   09/05/2018  Kristie [name] [birth year] [id number]  [facility] Medication: atorvastatin 80 mg Date & Supply of last refill: 07/18/2018, 90 day supply Current insurance:[employer]   Outreach:  Incoming call from Kristie [name] in response to the [facility] Medication Adherence Campaign. Speak with patient. HIPAA identifiers verified.  Subjective:  Kristie [name] reports that she takes her atorvastatin 80 mg once daily as directed. Reports that she might occasionally miss a dose when she forgets, at busy times in her life. Counsel patient on the importance of adherence to this medication. Counsel about using medication adherence aids, such as weekly pillbox and daily alarm. Patient expresses interest in starting to use a daily reminder alarm and states that she will start doing this.  Patient denies any further medication questions/concerns at this time.    Objective: Lab Results  Component Value Date   CREATININE 0.92 04/05/2018   CREATININE 0.95 09/17/2017   CREATININE 0.97 03/26/2017    Lipid Panel     Component Value Date/Time   CHOL 119 09/17/2017 1012   TRIG 145.0 09/17/2017 1012   HDL 38.90 (L) 09/17/2017 1012   CHOLHDL 3 09/17/2017 1012   VLDL 29.0 09/17/2017 1012   LDLCALC 51 09/17/2017 1012   LDLDIRECT 167.1 04/02/2013 0852    BP Readings from Last 3 Encounters:  04/05/18 125/75  01/30/18 130/78  09/17/17 120/64    No Known Allergies   Assessment:  Barrier identified: . Forgets to take    Plan:  1) Kristie [name] to start using a daily alarm to help her to remember to take her medication consistently.  2) Will close pharmacy episode at this time.  Harlow Asa, PharmD, [place] [facility] [phone]

## 2018-09-09 ENCOUNTER — Ambulatory Visit: Payer: Self-pay | Admitting: Family Medicine
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
## 2018-09-16 ENCOUNTER — Telehealth: Payer: Self-pay | Admitting: Nutrition

## 2018-09-16 NOTE — Telephone Encounter (Signed)
Message left on machine to call me.

## 2018-09-17 NOTE — Telephone Encounter (Signed)
Patient reports that she is not drinking sweet drinks, and has just started a new vial of R insulin.  Says does not wait 30 min. After taking insulin before eating a meal.  Advised her strongly to do this.  She reports that she has been taking Metformin 1000mg . BID, and saw on the bottle that she should be taking it only once a day.  She reports that she had started taking it once a day about 2-3 weeks ago, and has seen her reading worsen.   It is not time to upgrade her pump.  She has 3 more months.   We changed her I/C ratio to 2.5, which will give her about 2u more for each meal.  She was told to download in one week, or call sooner if readings are still high or drop low

## 2018-09-18 NOTE — Telephone Encounter (Signed)
OK to increase Metformin to 1000 mg bid. Thank you!

## 2018-09-22 ENCOUNTER — Other Ambulatory Visit: Payer: Self-pay | Admitting: Internal Medicine

## 2018-09-23 DIAGNOSIS — E1065 Type 1 diabetes mellitus with hyperglycemia: Secondary | ICD-10-CM | POA: Diagnosis not present

## 2018-09-23 MED ORDER — METFORMIN HCL 1000 MG PO TABS: 1000.0000 mg | ORAL_TABLET | Freq: Two times a day (BID) | ORAL | 3 refills | Status: DC

## 2018-09-23 NOTE — Telephone Encounter (Signed)
Patient was told that she can go back to taking Metformin 1000mg . Bid.  Says blood sugars are "a little better", but that they are still in the low 200s.  She was told to start this today and to download her pump to [facility], and call us Friday atter she does this, to take a look at the reading.  She agreed to do this

## 2018-09-23 NOTE — Telephone Encounter (Signed)
New RX sent

## 2018-09-23 NOTE — Addendum Note (Signed)
Addended by: Cardell Peach I on: 09/23/2018 02:34 PM   Modules accepted: Orders

## 2018-09-29 ENCOUNTER — Telehealth: Payer: Self-pay | Admitting: Dietician

## 2018-09-29 NOTE — Telephone Encounter (Signed)
Patient called stating that she had sent her blood sugar readings and would like Linda to look at them. Will forward message to [place].  Antonieta Iba, RD, LDN, CDE

## 2018-09-30 ENCOUNTER — Telehealth: Payer: Self-pay | Admitting: Nutrition

## 2018-09-30 NOTE — Telephone Encounter (Signed)
Patient reports that she is back taking 2 metformin and going to the gym each day and doing 45 min. Of aerobic exercise.  All readings are between 110 and 160.  She is very pleased.  FBS today was 124.

## 2018-09-30 NOTE — Telephone Encounter (Signed)
Wonderful. 

## 2018-10-09 ENCOUNTER — Telehealth: Payer: Self-pay | Admitting: Nutrition

## 2018-10-09 NOTE — Telephone Encounter (Signed)
Discussed pump download with patient.  Patient reports that she eats a snack at MN1-3X/wk.  She notes that even if she counts carbs accurately, she is always high fasting  She also notes that when she delays eating breakfast in the am, from 8AM, until 11AM, her blood sugar "always rises".   Says she sometimes snacks on small amount of food around 4PM, and does not bolus for this--causing her blood sugars to rise.  She was reminded of the need to bolus for all foods, except those that are used to treat lows.  She reported good understanding of this.   Twice she forgot her PM dose of Metformin, and noticed that her FBSs were high.   She is also walking now, instead of the gym 1 1/2 miles, at 11AM, or 2-3PM, saying that this will using drop her blood sugar before lunch, or later that evening. She is not walking daily, just 4-5 days/wk.  Has no specific days that she does this  She says she is pleased with these readings, and promised to give a bolus for snacks in the late afternoon

## 2018-10-23 ENCOUNTER — Other Ambulatory Visit: Payer: Self-pay

## 2018-10-23 ENCOUNTER — Ambulatory Visit (INDEPENDENT_AMBULATORY_CARE_PROVIDER_SITE_OTHER): Payer: PPO | Admitting: Family Medicine

## 2018-10-23 ENCOUNTER — Encounter: Payer: Self-pay | Admitting: Family Medicine

## 2018-10-23 DIAGNOSIS — E118 Type 2 diabetes mellitus with unspecified complications: Secondary | ICD-10-CM | POA: Diagnosis not present

## 2018-10-23 DIAGNOSIS — E7800 Pure hypercholesterolemia, unspecified: Secondary | ICD-10-CM

## 2018-10-23 DIAGNOSIS — F5104 Psychophysiologic insomnia: Secondary | ICD-10-CM

## 2018-10-23 DIAGNOSIS — F411 Generalized anxiety disorder: Secondary | ICD-10-CM | POA: Diagnosis not present

## 2018-10-23 DIAGNOSIS — F3342 Major depressive disorder, recurrent, in full remission: Secondary | ICD-10-CM | POA: Diagnosis not present

## 2018-10-23 MED ORDER — CLONAZEPAM 1 MG PO TABS: ORAL_TABLET | ORAL | 1 refills | Status: DC
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
## 2018-10-24 DIAGNOSIS — E1065 Type 1 diabetes mellitus with hyperglycemia: Secondary | ICD-10-CM | POA: Diagnosis not present

## 2018-11-02 ENCOUNTER — Encounter: Payer: Self-pay | Admitting: Internal Medicine

## 2018-11-20 ENCOUNTER — Ambulatory Visit (INDEPENDENT_AMBULATORY_CARE_PROVIDER_SITE_OTHER): Payer: PPO | Admitting: Internal Medicine

## 2018-11-20 ENCOUNTER — Encounter: Payer: Self-pay | Admitting: Internal Medicine

## 2018-11-20 DIAGNOSIS — E1065 Type 1 diabetes mellitus with hyperglycemia: Secondary | ICD-10-CM | POA: Diagnosis not present

## 2018-11-20 MED ORDER — GLUCOSE BLOOD VI STRP: ORAL_STRIP | 5 refills | Status: DC

## 2018-11-20 MED ORDER — BAYER MICROLET LANCETS MISC: 5 refills | Status: DC

## 2018-11-20 MED ORDER — GLUCAGON (RDNA) 1 MG IJ KIT: 1.0000 mg | PACK | Freq: Once | INTRAMUSCULAR | 12 refills | Status: DC | PRN

## 2018-11-20 NOTE — Patient Instructions (Signed)
Please continue: - Metformin 1000 mg 2x a day  Please change the pump settings as follows: - basal rates:  12 am: 2.0 >> 2.5 4 am: 3.0 12 pm: 2.5 - ICR:  12 am: 3.3 5 pm: 3.3 8 pm: 3.0 - target: 100-110 - ISF: 40 >> 50 - Active insulin time: 4h  Please do the following approximately 15 minutes before every meal: - Enter carbs (C) - Enter sugars (S) - Start insulin bolus (I)  Try to use a temporary basal rate of 50% when you exercise/work in the yard and possibly 1-2h later.  Use manual boluses for any bolus or above 20 units.  Please return in 3 months with your sugar log.
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
## 2018-11-23 DIAGNOSIS — E1065 Type 1 diabetes mellitus with hyperglycemia: Secondary | ICD-10-CM | POA: Diagnosis not present

## 2018-11-24 ENCOUNTER — Telehealth: Payer: Self-pay | Admitting: Nutrition

## 2018-11-24 NOTE — Telephone Encounter (Signed)
Patient reported that she spoke with Dr. Cruzita Lederer and she needs to make some changes to her basal rate and bolus settings, but does not know how to do this.  I talked her into changing the basal rates per Dr. Arman Filter order, but when she went into the bolus set up, she said that her ISF was set at 25 from MN to MN.

## 2018-11-24 NOTE — Telephone Encounter (Signed)
I did not have the settings to review when we talked... Please ask her to go to 35 in this case.

## 2018-11-25 NOTE — Telephone Encounter (Signed)
Patient talked into changing her ISF from 25 to 43.

## 2018-12-01 ENCOUNTER — Telehealth: Payer: Self-pay | Admitting: Internal Medicine

## 2018-12-01 NOTE — Telephone Encounter (Signed)
[place] called in regards to paperwork faxed over for a request. Will refax today to [phone].  Ph # [phone]

## 2018-12-01 NOTE — Telephone Encounter (Signed)
I faxed this on 11/28/2018.  I have faxed it again today.

## 2018-12-05 DIAGNOSIS — E1065 Type 1 diabetes mellitus with hyperglycemia: Secondary | ICD-10-CM | POA: Diagnosis not present

## 2018-12-15 ENCOUNTER — Other Ambulatory Visit: Payer: Self-pay | Admitting: Family Medicine

## 2018-12-15 ENCOUNTER — Other Ambulatory Visit: Payer: Self-pay | Admitting: Internal Medicine

## 2018-12-20 ENCOUNTER — Other Ambulatory Visit: Payer: Self-pay | Admitting: Family Medicine

## 2018-12-24 DIAGNOSIS — E1065 Type 1 diabetes mellitus with hyperglycemia: Secondary | ICD-10-CM | POA: Diagnosis not present

## 2019-01-23 DIAGNOSIS — E1065 Type 1 diabetes mellitus with hyperglycemia: Secondary | ICD-10-CM | POA: Diagnosis not present

## 2019-02-18 ENCOUNTER — Other Ambulatory Visit: Payer: Self-pay

## 2019-02-18 ENCOUNTER — Ambulatory Visit (INDEPENDENT_AMBULATORY_CARE_PROVIDER_SITE_OTHER): Payer: PPO | Admitting: Family Medicine

## 2019-02-18 ENCOUNTER — Encounter: Payer: Self-pay | Admitting: Family Medicine

## 2019-02-18 VITALS — BP 138/85 | HR 102 | Temp 98.0°F | Resp 16 | Ht 68.0 in | Wt 252.5 lb

## 2019-02-18 DIAGNOSIS — R259 Unspecified abnormal involuntary movements: Secondary | ICD-10-CM

## 2019-02-18 DIAGNOSIS — R7309 Other abnormal glucose: Secondary | ICD-10-CM | POA: Diagnosis not present

## 2019-02-18 LAB — GLUCOSE, POCT (MANUAL RESULT ENTRY): POC Glucose: 335 mg/dl — AB (ref 70–99)
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
## 2019-02-18 NOTE — Patient Instructions (Addendum)
I think you may be having a side effect of your medication Abilify.  Take half a tab for 1  week and then 1/2 tab every other day until you see Dr. Anitra Lauth in 2 weeks. You may need to come off this med altogether.   Tardive Dyskinesia Tardive dyskinesia is a disorder that causes uncontrollable body movements. It occurs in some people who are taking certain medicines to treat a mental illness (neuroleptic medicine) or have taken this type of medicine in the past. These medicines block the effects of a specific brain chemical (dopamine). Sometimes, tardive dyskinesia starts months or years after someone took the medicine. Not everyone who takes a neuroleptic medicine will get tardive dyskinesia. What are the causes? This condition is caused by changes in your brain that are associated with taking a neuroleptic medicine. What increases the risk? If you are taking a neuroleptic medicine, your risk for tardive dyskinesia may be higher if you:  Are taking an older type of neuroleptic medicine.  Have been taking the medicine for a long time at a high dose.  Are a woman past the age of menopause.  Are older than 60.  Have a history of alcohol or drug abuse. What are the signs or symptoms? Abnormal, uncontrollable movements are the main symptom of tardive dyskinesia. These types of movements may include:  Grimacing.  Sticking out or twisting your tongue.  Making chewing or sucking sounds.  Making grunting or sighing noises.  Blinking your eyes.  Twisting, swaying, or thrusting your body.  Foot tapping or finger waving.  Rapid movements of your arms or legs. How is this diagnosed? Your health care provider may suspect that you have tardive dyskinesia if:  You have been taking neuroleptic medicines.  You have abnormal movements that you cannot control. If you are taking a medicine that can cause tardive dyskinesia, your health care provider may screen you for early signs of the  condition. This may include:  Observing your body movements.  Using a specific rating scale called the Abnormal Involuntary Movement Scale (AIMS). You may also have tests to rule out other conditions that cause abnormal body movements, including:  Parkinson's disease.  Huntington's disease.  Stroke. How is this treated? The best treatment for tardive dyskinesia is to lower the dose of your medicine or to switch to a different medicine at the first sign of abnormal and uncontrolled movements. There is no cure for long-term (chronic) tardive dyskinesia. Some medicines may help control the movements. These include:  Clozapine, a medicine used to treat mental illness (antipsychotic).  Some muscle relaxants.  Some anti-seizure medicines.  Some medicines used to treat high blood pressure.  Some tranquilizers (sedatives). Follow these instructions at home:      Take over-the-counter and prescription medicines only as told by your health care provider. Do not stop or start taking any medicines without talking to your health care provider first.  Do not abuse drugs or alcohol.  Keep all follow-up visits as told by your health care provider. This is important. Contact a health care provider if:  You are unable to eat or drink.  You have had a fall.  Your symptoms get worse. Summary  Tardive dyskinesia is a disorder that causes uncontrollable body movements. These may include grimacing, sticking out or twisting your tongue, blinking your eyes, or rapid movements of your arms or legs.  The condition occurs in some people who are taking certain medicines to treat a mental illness (neuroleptic medicine)  or have taken this type of medicine in the past.  The best treatment for tardive dyskinesia is to lower the dose of your medicine or to switch to a different medicine at the first sign of abnormal and uncontrolled movements.  There is no cure for long-term (chronic) tardive  dyskinesia, but some medicines may help control the movements. This information is not intended to replace advice given to you by your health care provider. Make sure you discuss any questions you have with your health care provider. Document Released: 06/22/2002 Document Revised: 07/25/2017 Document Reviewed: 07/25/2017 Elsevier Patient Education  2020 [employer].

## 2019-02-19 ENCOUNTER — Encounter: Payer: Self-pay | Admitting: Family Medicine

## 2019-02-19 DIAGNOSIS — R259 Unspecified abnormal involuntary movements: Secondary | ICD-10-CM | POA: Insufficient documentation

## 2019-03-03 ENCOUNTER — Encounter: Payer: Self-pay | Admitting: Family Medicine

## 2019-03-05 ENCOUNTER — Other Ambulatory Visit: Payer: Self-pay | Admitting: Family Medicine

## 2019-03-13 ENCOUNTER — Other Ambulatory Visit: Payer: Self-pay

## 2019-03-13 MED ORDER — ONETOUCH VERIO W/DEVICE KIT: 1.0000 | PACK | Freq: Every day | 0 refills | Status: DC | PRN

## 2019-03-13 MED ORDER — ONETOUCH ULTRASOFT LANCETS MISC: 12 refills | Status: DC

## 2019-03-13 MED ORDER — ONETOUCH VERIO VI STRP: ORAL_STRIP | 12 refills | Status: DC

## 2019-03-25 ENCOUNTER — Ambulatory Visit: Payer: PPO | Admitting: Internal Medicine

## 2019-03-26 DIAGNOSIS — E1065 Type 1 diabetes mellitus with hyperglycemia: Secondary | ICD-10-CM | POA: Diagnosis not present

## 2019-04-02 ENCOUNTER — Other Ambulatory Visit: Payer: Self-pay | Admitting: Family Medicine

## 2019-04-02 DIAGNOSIS — Z1231 Encounter for screening mammogram for malignant neoplasm of breast: Secondary | ICD-10-CM

## 2019-04-03 ENCOUNTER — Telehealth: Payer: Self-pay

## 2019-04-03 NOTE — Telephone Encounter (Signed)
Received forms for DMV for pt. PCP will review and sign, if appropriate.

## 2019-04-08 NOTE — Telephone Encounter (Signed)
signed

## 2019-04-22 NOTE — Telephone Encounter (Signed)
Left message for patient that Dr. Anitra Lauth has completed his portion of the DMW Disability Paperwork and is ready for pick up. Patient will need to take the diabetic portion to be completed by Dr. Cruzita Lederer.

## 2019-04-23 NOTE — Telephone Encounter (Signed)
Pt has appt tomorrow with Dr.McGowen and can pick up then.

## 2019-04-24 ENCOUNTER — Other Ambulatory Visit: Payer: Self-pay

## 2019-04-24 ENCOUNTER — Ambulatory Visit (INDEPENDENT_AMBULATORY_CARE_PROVIDER_SITE_OTHER): Payer: PPO | Admitting: Family Medicine

## 2019-04-24 ENCOUNTER — Encounter: Payer: Self-pay | Admitting: Family Medicine

## 2019-04-24 ENCOUNTER — Telehealth: Payer: Self-pay | Admitting: Internal Medicine

## 2019-04-24 VITALS — BP 108/75 | HR 84 | Temp 98.5°F | Resp 16 | Ht 68.0 in | Wt 246.2 lb

## 2019-04-24 DIAGNOSIS — Z124 Encounter for screening for malignant neoplasm of cervix: Secondary | ICD-10-CM | POA: Diagnosis not present

## 2019-04-24 DIAGNOSIS — Z79899 Other long term (current) drug therapy: Secondary | ICD-10-CM | POA: Diagnosis not present

## 2019-04-24 DIAGNOSIS — Z1211 Encounter for screening for malignant neoplasm of colon: Secondary | ICD-10-CM

## 2019-04-24 DIAGNOSIS — Z1231 Encounter for screening mammogram for malignant neoplasm of breast: Secondary | ICD-10-CM

## 2019-04-24 DIAGNOSIS — Z0000 Encounter for general adult medical examination without abnormal findings: Secondary | ICD-10-CM

## 2019-04-24 DIAGNOSIS — F411 Generalized anxiety disorder: Secondary | ICD-10-CM | POA: Diagnosis not present

## 2019-04-24 DIAGNOSIS — Z862 Personal history of diseases of the blood and blood-forming organs and certain disorders involving the immune mechanism: Secondary | ICD-10-CM | POA: Diagnosis not present

## 2019-04-24 DIAGNOSIS — E669 Obesity, unspecified: Secondary | ICD-10-CM | POA: Insufficient documentation

## 2019-04-24 DIAGNOSIS — E7800 Pure hypercholesterolemia, unspecified: Secondary | ICD-10-CM | POA: Diagnosis not present

## 2019-04-24 DIAGNOSIS — E118 Type 2 diabetes mellitus with unspecified complications: Secondary | ICD-10-CM | POA: Diagnosis not present

## 2019-04-24 LAB — COMPREHENSIVE METABOLIC PANEL
ALT: 32 U/L (ref 0–35)
AST: 24 U/L (ref 0–37)
Albumin: 4.1 g/dL (ref 3.5–5.2)
Alkaline Phosphatase: 162 U/L — ABNORMAL HIGH (ref 39–117)
BUN: 18 mg/dL (ref 6–23)
CO2: 27 mEq/L (ref 19–32)
Calcium: 9.3 mg/dL (ref 8.4–10.5)
Chloride: 105 mEq/L (ref 96–112)
Creatinine, Ser: 0.94 mg/dL (ref 0.40–1.20)
GFR: 62.2 mL/min (ref >60.00)
Glucose, Bld: 140 mg/dL — ABNORMAL HIGH (ref 70–99)
Potassium: 4.4 mEq/L (ref 3.5–5.1)
Sodium: 140 mEq/L (ref 135–145)
Total Bilirubin: 1 mg/dL (ref 0.2–1.2)
Total Protein: 6.6 g/dL (ref 6.0–8.3)

## 2019-04-24 LAB — LIPID PANEL
Cholesterol: 243 mg/dL — ABNORMAL HIGH (ref 0–200)
HDL: 42.2 mg/dL (ref >39.00)
LDL Cholesterol: 170 mg/dL — ABNORMAL HIGH (ref 0–99)
NonHDL: 200.53
Total CHOL/HDL Ratio: 6
Triglycerides: 153 mg/dL — ABNORMAL HIGH (ref 0.0–149.0)
VLDL: 30.6 mg/dL (ref 0.0–40.0)

## 2019-04-24 LAB — CBC WITH DIFFERENTIAL/PLATELET
Basophils Absolute: 0 10*3/uL (ref 0.0–0.1)
Basophils Relative: 0.4 % (ref 0.0–3.0)
Eosinophils Absolute: 0.1 10*3/uL (ref 0.0–0.7)
Eosinophils Relative: 1.7 % (ref 0.0–5.0)
HCT: 43 % (ref 36.0–46.0)
Hemoglobin: 14.1 g/dL (ref 12.0–15.0)
Lymphocytes Relative: 29.5 % (ref 12.0–46.0)
Lymphs Abs: 1.8 10*3/uL (ref 0.7–4.0)
MCHC: 32.7 g/dL (ref 30.0–36.0)
MCV: 88.9 fl (ref 78.0–100.0)
Monocytes Absolute: 0.5 10*3/uL (ref 0.1–1.0)
Monocytes Relative: 8.2 % (ref 3.0–12.0)
Neutro Abs: 3.6 10*3/uL (ref 1.4–7.7)
Neutrophils Relative %: 60.2 % (ref 43.0–77.0)
Platelets: 312 10*3/uL (ref 150.0–400.0)
RBC: 4.84 Mil/uL (ref 3.87–5.11)
RDW: 13.6 % (ref 11.5–15.5)
WBC: 6 10*3/uL (ref 4.0–10.5)

## 2019-04-24 LAB — TSH: TSH: 2.05 u[IU]/mL (ref 0.35–4.50)

## 2019-04-24 NOTE — Patient Instructions (Signed)
Health Maintenance, Female Adopting a healthy lifestyle and getting preventive care are important in promoting health and wellness. Ask your health care provider about:  The right schedule for you to have regular tests and exams.  Things you can do on your own to prevent diseases and keep yourself healthy. What should I know about diet, weight, and exercise? Eat a healthy diet   Eat a diet that includes plenty of vegetables, fruits, low-fat dairy products, and lean protein.  Do not eat a lot of foods that are high in solid fats, added sugars, or sodium. Maintain a healthy weight Body mass index (BMI) is used to identify weight problems. It estimates body fat based on height and weight. Your health care provider can help determine your BMI and help you achieve or maintain a healthy weight. Get regular exercise Get regular exercise. This is one of the most important things you can do for your health. Most adults should:  Exercise for at least 150 minutes each week. The exercise should increase your heart rate and make you sweat (moderate-intensity exercise).  Do strengthening exercises at least twice a week. This is in addition to the moderate-intensity exercise.  Spend less time sitting. Even light physical activity can be beneficial. Watch cholesterol and blood lipids Have your blood tested for lipids and cholesterol at 53 years of age, then have this test every 5 years. Have your cholesterol levels checked more often if:  Your lipid or cholesterol levels are high.  You are older than 53 years of age.  You are at high risk for heart disease. What should I know about cancer screening? Depending on your health history and family history, you may need to have cancer screening at various ages. This may include screening for:  Breast cancer.  Cervical cancer.  Colorectal cancer.  Skin cancer.  Lung cancer. What should I know about heart disease, diabetes, and high blood  pressure? Blood pressure and heart disease  High blood pressure causes heart disease and increases the risk of stroke. This is more likely to develop in people who have high blood pressure readings, are of African descent, or are overweight.  Have your blood pressure checked: ? Every 3-5 years if you are 18-39 years of age. ? Every year if you are 40 years old or older. Diabetes Have regular diabetes screenings. This checks your fasting blood sugar level. Have the screening done:  Once every three years after age 40 if you are at a normal weight and have a low risk for diabetes.  More often and at a younger age if you are overweight or have a high risk for diabetes. What should I know about preventing infection? Hepatitis B If you have a higher risk for hepatitis B, you should be screened for this virus. Talk with your health care provider to find out if you are at risk for hepatitis B infection. Hepatitis C Testing is recommended for:  Everyone born from 1945 through 1965.  Anyone with known risk factors for hepatitis C. Sexually transmitted infections (STIs)  Get screened for STIs, including gonorrhea and chlamydia, if: ? You are sexually active and are younger than 53 years of age. ? You are older than 53 years of age and your health care provider tells you that you are at risk for this type of infection. ? Your sexual activity has changed since you were last screened, and you are at increased risk for chlamydia or gonorrhea. Ask your health care provider if   you are at risk.  Ask your health care provider about whether you are at high risk for HIV. Your health care provider may recommend a prescription medicine to help prevent HIV infection. If you choose to take medicine to prevent HIV, you should first get tested for HIV. You should then be tested every 3 months for as long as you are taking the medicine. Pregnancy  If you are about to stop having your period (premenopausal) and  you may become pregnant, seek counseling before you get pregnant.  Take 400 to 800 micrograms (mcg) of folic acid every day if you become pregnant.  Ask for birth control (contraception) if you want to prevent pregnancy. Osteoporosis and menopause Osteoporosis is a disease in which the bones lose minerals and strength with aging. This can result in bone fractures. If you are 65 years old or older, or if you are at risk for osteoporosis and fractures, ask your health care provider if you should:  Be screened for bone loss.  Take a calcium or vitamin D supplement to lower your risk of fractures.  Be given hormone replacement therapy (HRT) to treat symptoms of menopause. Follow these instructions at home: Lifestyle  Do not use any products that contain nicotine or tobacco, such as cigarettes, e-cigarettes, and chewing tobacco. If you need help quitting, ask your health care provider.  Do not use street drugs.  Do not share needles.  Ask your health care provider for help if you need support or information about quitting drugs. Alcohol use  Do not drink alcohol if: ? Your health care provider tells you not to drink. ? You are pregnant, may be pregnant, or are planning to become pregnant.  If you drink alcohol: ? Limit how much you use to 0-1 drink a day. ? Limit intake if you are breastfeeding.  Be aware of how much alcohol is in your drink. In the [place] one drink equals one 12 oz bottle of beer (355 mL), one 5 oz glass of wine (148 mL), or one 1 oz glass of hard liquor (44 mL). General instructions  Schedule regular health, dental, and eye exams.  Stay current with your vaccines.  Tell your health care provider if: ? You often feel depressed. ? You have ever been abused or do not feel safe at home. Summary  Adopting a healthy lifestyle and getting preventive care are important in promoting health and wellness.  Follow your health care provider's instructions about healthy  diet, exercising, and getting tested or screened for diseases.  Follow your health care provider's instructions on monitoring your cholesterol and blood pressure. This information is not intended to replace advice given to you by your health care provider. Make sure you discuss any questions you have with your health care provider. Document Released: 01/15/2011 Document Revised: 06/25/2018 Document Reviewed: 06/25/2018 Elsevier Patient Education  2020 [employer] Inc[employer]  

## 2019-04-24 NOTE — Telephone Encounter (Signed)
Patient has dropped off her DMV paperwork to be filled out and has been placed in the doctors box. Also, call the patient when completed.  Please Advise, Thanks
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
## 2019-04-24 NOTE — Telephone Encounter (Signed)
Paperwork given to Dr. Cruzita Lederer.

## 2019-04-25 LAB — IRON,TIBC AND FERRITIN PANEL
%SAT: 27 % (calc) (ref 16–45)
Ferritin: 51 ng/mL (ref 16–232)
Iron: 106 ug/dL (ref 45–160)
TIBC: 390 mcg/dL (calc) (ref 250–450)

## 2019-04-26 LAB — PAIN MGMT, PROFILE 8 W/CONF, U
6 Acetylmorphine: NEGATIVE ng/mL
Alcohol Metabolites: NEGATIVE ng/mL (ref <500)
Alphahydroxyalprazolam: NEGATIVE ng/mL
Alphahydroxymidazolam: NEGATIVE ng/mL
Alphahydroxytriazolam: NEGATIVE ng/mL
Aminoclonazepam: 535 ng/mL
Amphetamines: NEGATIVE ng/mL
Benzodiazepines: POSITIVE ng/mL
Buprenorphine, Urine: NEGATIVE ng/mL
Cocaine Metabolite: NEGATIVE ng/mL
Creatinine: 187.4 mg/dL
Hydroxyethylflurazepam: NEGATIVE ng/mL
Lorazepam: NEGATIVE ng/mL
MDMA: NEGATIVE ng/mL
Marijuana Metabolite: NEGATIVE ng/mL
Nordiazepam: NEGATIVE ng/mL
Opiates: NEGATIVE ng/mL
Oxazepam: NEGATIVE ng/mL
Oxidant: NEGATIVE ug/mL
Oxycodone: NEGATIVE ng/mL
Temazepam: NEGATIVE ng/mL
pH: 6.8 (ref 4.5–9.0)

## 2019-04-27 ENCOUNTER — Telehealth: Payer: Self-pay

## 2019-04-27 NOTE — Telephone Encounter (Signed)
DMV paperwork completed and left at front desk for patient to pick up.  Patient notified.

## 2019-04-28 ENCOUNTER — Other Ambulatory Visit: Payer: Self-pay

## 2019-04-28 DIAGNOSIS — E7800 Pure hypercholesterolemia, unspecified: Secondary | ICD-10-CM

## 2019-04-28 MED ORDER — EZETIMIBE 10 MG PO TABS: 10.0000 mg | ORAL_TABLET | Freq: Every day | ORAL | 2 refills | Status: DC

## 2019-04-29 ENCOUNTER — Encounter: Payer: Self-pay | Admitting: Internal Medicine

## 2019-04-29 LAB — HM DIABETES EYE EXAM

## 2019-05-12 ENCOUNTER — Other Ambulatory Visit: Payer: Self-pay | Admitting: Internal Medicine

## 2019-05-12 ENCOUNTER — Other Ambulatory Visit: Payer: Self-pay

## 2019-05-12 ENCOUNTER — Encounter: Payer: Self-pay | Admitting: Internal Medicine

## 2019-05-12 ENCOUNTER — Ambulatory Visit: Payer: PPO | Admitting: Internal Medicine

## 2019-05-12 VITALS — BP 120/60 | HR 96 | Ht 68.0 in | Wt 249.0 lb

## 2019-05-12 DIAGNOSIS — E1065 Type 1 diabetes mellitus with hyperglycemia: Secondary | ICD-10-CM

## 2019-05-12 LAB — POCT GLYCOSYLATED HEMOGLOBIN (HGB A1C): Hemoglobin A1C: 8.6 % — AB (ref 4.0–5.6)

## 2019-05-12 MED ORDER — FREESTYLE LIBRE 2 READER SYSTM DEVI: 1.0000 | Freq: Once | 0 refills | Status: AC

## 2019-05-12 MED ORDER — FREESTYLE LIBRE 2 SENSOR SYSTM MISC: 1.0000 | 3 refills | Status: DC

## 2019-05-12 NOTE — Addendum Note (Signed)
Addended by: Cardell Peach I on: 05/12/2019 04:44 PM   Modules accepted: Orders

## 2019-05-12 NOTE — Patient Instructions (Addendum)
Please continue: - Metformin 1000mg  2x a day  Please change the pump settings as follows: - basal rates:  12 am: 2.05 4 am: 3.0 12 pm: 2.5 - ICR:  12 am: 6 >> 5 5 pm: 6 >> 5 8 pm: 6 >> 5 - target: 100-110 - ISF: 35 - Active insulin time: 4h  Please do the following approximately 15 minutes before every meal: - Enter carbs (C) - Enter sugars (S) - Start insulin bolus (I)  Try to use a temporary basal rate of 50% when you exercise/work in the yard and possibly 1-2h later.  Use manual boluses for any bolus or above 20 units.  Please return in 4 months.
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
## 2019-05-19 ENCOUNTER — Telehealth: Payer: Self-pay | Admitting: Nutrition

## 2019-05-19 ENCOUNTER — Ambulatory Visit
Admission: RE | Admit: 2019-05-19 | Discharge: 2019-05-19 | Disposition: A | Discharge status: Home / Self Care | Payer: PPO | Source: Ambulatory Visit | Attending: Family Medicine | Admitting: Family Medicine

## 2019-05-19 ENCOUNTER — Other Ambulatory Visit: Payer: Self-pay

## 2019-05-19 DIAGNOSIS — Z1231 Encounter for screening mammogram for malignant neoplasm of breast: Secondary | ICD-10-CM

## 2019-05-19 NOTE — Telephone Encounter (Signed)
Patient reported that her blood sugars were low all day yesterday after making changes to her pump settings, per Dr. Arman Filter orders.  She wanted to come in to see if she put in the wrong settings.   She cam in and was seen.  Her settings were put in accurately and the [facility] download was shown to Dr. Cruzita Lederer. Per her voice order, that was repeated to her, the I/C ratio was changed from 5 to 5.5.   She was told to call if blood sugars still drop.  She agreed to do this.

## 2019-05-21 DIAGNOSIS — E1065 Type 1 diabetes mellitus with hyperglycemia: Secondary | ICD-10-CM | POA: Diagnosis not present

## 2019-06-29 ENCOUNTER — Ambulatory Visit (INDEPENDENT_AMBULATORY_CARE_PROVIDER_SITE_OTHER): Payer: PPO | Admitting: Family Medicine

## 2019-06-29 ENCOUNTER — Other Ambulatory Visit: Payer: Self-pay

## 2019-06-29 DIAGNOSIS — E7800 Pure hypercholesterolemia, unspecified: Secondary | ICD-10-CM

## 2019-06-29 LAB — ALT: ALT: 52 U/L — ABNORMAL HIGH (ref 0–35)

## 2019-06-29 LAB — LIPID PANEL
Cholesterol: 125 mg/dL (ref 0–200)
HDL: 38.1 mg/dL — ABNORMAL LOW (ref >39.00)
LDL Cholesterol: 65 mg/dL (ref 0–99)
NonHDL: 87.3
Total CHOL/HDL Ratio: 3
Triglycerides: 112 mg/dL (ref 0.0–149.0)
VLDL: 22.4 mg/dL (ref 0.0–40.0)

## 2019-06-29 LAB — AST: AST: 28 U/L (ref 0–37)

## 2019-06-30 ENCOUNTER — Other Ambulatory Visit: Payer: Self-pay | Admitting: Family Medicine

## 2019-06-30 MED ORDER — ATORVASTATIN CALCIUM 80 MG PO TABS: 80.0000 mg | ORAL_TABLET | Freq: Every day | ORAL | 3 refills | Status: DC

## 2019-07-12 ENCOUNTER — Other Ambulatory Visit: Payer: Self-pay | Admitting: Family Medicine

## 2019-07-13 NOTE — Telephone Encounter (Signed)
Requesting: clonazepam Contract:04/24/19 UDS:04/24/19 Last Visit:04/24/19 Next Visit: advised to f/u April Last Refill: 10/23/18 (360,1)  Please Advise. Medication pending

## 2019-07-23 ENCOUNTER — Other Ambulatory Visit: Payer: Self-pay | Admitting: Family Medicine

## 2019-08-05 ENCOUNTER — Telehealth: Payer: Self-pay

## 2019-08-05 NOTE — Telephone Encounter (Signed)
[facility] states that the paper work sent in for this patient was missing the last two pages of paper work    Please advise

## 2019-08-05 NOTE — Telephone Encounter (Signed)
Chart notes faxed to [facility] with confirmation.

## 2019-08-11 NOTE — Telephone Encounter (Signed)
Noted.  Will fax again

## 2019-08-18 NOTE — Telephone Encounter (Signed)
I have faxed it 3 times.  I am going to discuss this with the patient and see if we can get her with a better and more reliable DME company.

## 2019-08-18 NOTE — Telephone Encounter (Signed)
Kristie [name] from [facility] is requesting last office note  From 05/12/19 be faxed again some of the pages are not going through-good fax number is [phone]

## 2019-09-15 ENCOUNTER — Other Ambulatory Visit: Payer: Self-pay

## 2019-09-15 ENCOUNTER — Ambulatory Visit: Payer: Medicare HMO | Admitting: Internal Medicine

## 2019-09-15 ENCOUNTER — Other Ambulatory Visit: Payer: Self-pay | Admitting: Internal Medicine

## 2019-09-15 ENCOUNTER — Encounter: Payer: Self-pay | Admitting: Internal Medicine

## 2019-09-15 VITALS — BP 108/70 | HR 85 | Ht 68.0 in | Wt 247.0 lb

## 2019-09-15 DIAGNOSIS — E1065 Type 1 diabetes mellitus with hyperglycemia: Secondary | ICD-10-CM

## 2019-09-15 LAB — POCT GLYCOSYLATED HEMOGLOBIN (HGB A1C): Hemoglobin A1C: 7.9 % — AB (ref 4.0–5.6)

## 2019-09-15 MED ORDER — METFORMIN HCL 1000 MG PO TABS: 1000.0000 mg | ORAL_TABLET | Freq: Two times a day (BID) | ORAL | 3 refills | Status: DC

## 2019-09-15 MED ORDER — INSULIN ASPART 100 UNIT/ML ~~LOC~~ SOLN: SUBCUTANEOUS | 11 refills | Status: DC

## 2019-09-15 NOTE — Patient Instructions (Addendum)
Please continue: - Metformin 1000mg  2x a day  Please change the pump settings as follows: - basal rates:  12 am: 2.05 4 am: 3.0 >> 2.8 12 pm: 2.5 - ICR:  12 am: 2.7 5 pm: 2.7 8 pm: 2.7 - target: 100-110 - ISF:  12 am: 35 >> 50 5 am: 35 9 pm: 35 >> 50  Please do the following approximately 15 minutes before every meal: - Enter carbs (C) - Enter sugars (S) - Start insulin bolus (I)  Try to use a temporary basal rate of 50% when you exercise/work in the yard and possibly 1-2h later.  Use manual boluses for any bolus or above 20 units.  Please return in 4 months.
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
## 2019-09-15 NOTE — Addendum Note (Signed)
Addended by: Cardell Peach I on: 09/15/2019 11:40 AM   Modules accepted: Orders

## 2019-09-21 DIAGNOSIS — E1065 Type 1 diabetes mellitus with hyperglycemia: Secondary | ICD-10-CM | POA: Diagnosis not present

## 2019-10-19 ENCOUNTER — Other Ambulatory Visit: Payer: Self-pay

## 2019-10-19 MED ORDER — FREESTYLE LIBRE 2 SENSOR MISC: 1.0000 | 4 refills | Status: DC

## 2019-10-27 ENCOUNTER — Other Ambulatory Visit: Payer: Self-pay

## 2019-10-27 MED ORDER — FREESTYLE LIBRE 2 SENSOR MISC: 1.0000 | 4 refills | Status: DC

## 2019-10-27 MED ORDER — ONETOUCH VERIO VI STRP: ORAL_STRIP | 12 refills | Status: DC

## 2019-10-28 ENCOUNTER — Other Ambulatory Visit: Payer: Self-pay

## 2019-10-28 MED ORDER — FREESTYLE LIBRE 2 SENSOR MISC: 1.0000 | 4 refills | Status: DC

## 2019-10-28 NOTE — Telephone Encounter (Signed)
Received PA request from patient pharmacy for the [facility] 2 CGM but she is already using this.  I went ahead and sent the PA request but it was automatically canceled because this can not be obtained at the pharmacy it must come from her Medicare DME which is [facility].  I will fax her refill request to [facility], however since she has Medicare we will most likely need [place] to fax [place] the Medicare required certificate of medical necessity that Dr. Cruzita Lederer must fill out and sign. Once we fax that along with the most recent visit notes then Oletta Lamas can ship her sensors.

## 2019-10-29 ENCOUNTER — Telehealth: Payer: Self-pay

## 2019-10-29 NOTE — Telephone Encounter (Signed)
Patient contacted regarding clonazepam rx and she states [facility] will not cover any more refills. She has enough to last until appt next week, will discuss this with PCP to see what else can be done.

## 2019-10-29 NOTE — Telephone Encounter (Signed)
Please call regarding her Clonazepam prescription and her insurance.  Patient can be reached [phone].  Thank you.

## 2019-11-04 ENCOUNTER — Other Ambulatory Visit: Payer: Self-pay

## 2019-11-05 ENCOUNTER — Telehealth: Payer: Self-pay

## 2019-11-05 ENCOUNTER — Ambulatory Visit (INDEPENDENT_AMBULATORY_CARE_PROVIDER_SITE_OTHER): Payer: Medicare HMO | Admitting: Family Medicine

## 2019-11-05 ENCOUNTER — Encounter: Payer: Self-pay | Admitting: Family Medicine

## 2019-11-05 VITALS — BP 128/78 | HR 100 | Temp 98.2°F | Resp 16 | Ht 68.0 in | Wt 245.8 lb

## 2019-11-05 DIAGNOSIS — E7800 Pure hypercholesterolemia, unspecified: Secondary | ICD-10-CM

## 2019-11-05 DIAGNOSIS — E669 Obesity, unspecified: Secondary | ICD-10-CM

## 2019-11-05 DIAGNOSIS — F411 Generalized anxiety disorder: Secondary | ICD-10-CM

## 2019-11-05 DIAGNOSIS — R69 Illness, unspecified: Secondary | ICD-10-CM | POA: Diagnosis not present

## 2019-11-05 MED ORDER — CLONAZEPAM 2 MG PO TABS: ORAL_TABLET | ORAL | 5 refills | Status: DC

## 2019-11-05 MED ORDER — CLONAZEPAM 2 MG PO TABS: 2.0000 mg | ORAL_TABLET | Freq: Two times a day (BID) | ORAL | 0 refills | Status: DC
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
## 2019-11-05 NOTE — Telephone Encounter (Signed)
Per PCP, clonazepam 2mg  rx sent for #30 sig: 1 tab bid to be cancelled and would like to keep rx for 2mg  #90 w/ 5 refills. New sig: 1 tab tid. Contacted pharmacy to have this done.

## 2019-11-06 ENCOUNTER — Telehealth: Payer: Self-pay

## 2019-11-06 MED ORDER — CLONAZEPAM 2 MG PO TABS: ORAL_TABLET | ORAL | 5 refills | Status: DC

## 2019-11-06 NOTE — Telephone Encounter (Signed)
Oops. I meant take 1 of the 2mg  tabs tid. I just sent in a new rx.

## 2019-11-06 NOTE — Telephone Encounter (Signed)
Called pharmacy to clarify. Nothing further needed.

## 2019-11-06 NOTE — Telephone Encounter (Signed)
Please clarify if pt should be on 2 or 1mg  tab. Rx written for 2mg  but sig says 1mg  po tid. Most recent refill done 11/05/19 #90 with 5 refills.

## 2019-11-12 ENCOUNTER — Telehealth: Payer: Self-pay

## 2019-11-12 NOTE — Telephone Encounter (Signed)
Form for pump supplies/CGM and Certificate/Letter of Medical Neccescity have been received, completed, and signed by Dr. Cruzita Lederer.  Completed form has been faxed to [facility] with confirmation.

## 2019-12-02 ENCOUNTER — Other Ambulatory Visit: Payer: Self-pay | Admitting: Internal Medicine

## 2019-12-17 DIAGNOSIS — F329 Major depressive disorder, single episode, unspecified: Secondary | ICD-10-CM | POA: Diagnosis not present

## 2019-12-17 DIAGNOSIS — R32 Unspecified urinary incontinence: Secondary | ICD-10-CM | POA: Diagnosis not present

## 2019-12-17 DIAGNOSIS — F4323 Adjustment disorder with mixed anxiety and depressed mood: Secondary | ICD-10-CM | POA: Diagnosis not present

## 2019-12-17 DIAGNOSIS — E669 Obesity, unspecified: Secondary | ICD-10-CM | POA: Diagnosis not present

## 2019-12-17 DIAGNOSIS — Z596 Low income: Secondary | ICD-10-CM | POA: Diagnosis not present

## 2019-12-17 DIAGNOSIS — R69 Illness, unspecified: Secondary | ICD-10-CM | POA: Diagnosis not present

## 2019-12-17 DIAGNOSIS — E785 Hyperlipidemia, unspecified: Secondary | ICD-10-CM | POA: Diagnosis not present

## 2019-12-17 DIAGNOSIS — Z6838 Body mass index (BMI) 38.0-38.9, adult: Secondary | ICD-10-CM | POA: Diagnosis not present

## 2019-12-17 DIAGNOSIS — E109 Type 1 diabetes mellitus without complications: Secondary | ICD-10-CM | POA: Diagnosis not present

## 2019-12-17 DIAGNOSIS — Z7984 Long term (current) use of oral hypoglycemic drugs: Secondary | ICD-10-CM | POA: Diagnosis not present

## 2020-01-24 ENCOUNTER — Other Ambulatory Visit: Payer: Self-pay | Admitting: Family Medicine

## 2020-04-07 ENCOUNTER — Other Ambulatory Visit: Payer: Self-pay | Admitting: Family Medicine

## 2020-04-07 DIAGNOSIS — Z1231 Encounter for screening mammogram for malignant neoplasm of breast: Secondary | ICD-10-CM

## 2020-04-08 ENCOUNTER — Telehealth: Payer: Self-pay | Admitting: Family Medicine

## 2020-04-08 NOTE — Telephone Encounter (Signed)
Attempted to schedule AWV. Unable to LVM.  Will try at later time.  

## 2020-04-11 ENCOUNTER — Telehealth: Payer: Self-pay | Admitting: Family Medicine

## 2020-04-11 ENCOUNTER — Other Ambulatory Visit: Payer: Self-pay

## 2020-04-11 MED ORDER — FREESTYLE LIBRE 2 SENSOR MISC: 1.0000 | 4 refills | Status: DC

## 2020-04-11 MED ORDER — INSULIN ASPART 100 UNIT/ML ~~LOC~~ SOLN: SUBCUTANEOUS | 11 refills | Status: DC

## 2020-04-11 NOTE — Telephone Encounter (Signed)
Pharmacy updated.

## 2020-04-11 NOTE — Telephone Encounter (Signed)
Both have been sent. 

## 2020-04-11 NOTE — Telephone Encounter (Signed)
Patient wanted to update pharmacy to [facility] :Address: [address] [place], [place] [address]

## 2020-04-11 NOTE — Telephone Encounter (Signed)
Pt of [place]. Ptrequest call in insulin & also the freestyle butterly that goes on arm so she doesn't have to prick finger:  Call to [facility] [address] in [place].  Pt needs to pick up Friday the freestyle & the insulin is due 04/26/20 wo pt wants all meds called in Friday.

## 2020-05-05 ENCOUNTER — Other Ambulatory Visit: Payer: Self-pay

## 2020-05-09 ENCOUNTER — Encounter: Payer: Self-pay | Admitting: Family Medicine

## 2020-05-09 ENCOUNTER — Other Ambulatory Visit: Payer: Self-pay

## 2020-05-09 ENCOUNTER — Ambulatory Visit (INDEPENDENT_AMBULATORY_CARE_PROVIDER_SITE_OTHER): Payer: Medicare HMO | Admitting: Family Medicine

## 2020-05-09 VITALS — BP 109/72 | HR 94 | Temp 97.7°F | Resp 16 | Ht 67.25 in | Wt 242.2 lb

## 2020-05-09 DIAGNOSIS — Z23 Encounter for immunization: Secondary | ICD-10-CM

## 2020-05-09 DIAGNOSIS — Z862 Personal history of diseases of the blood and blood-forming organs and certain disorders involving the immune mechanism: Secondary | ICD-10-CM

## 2020-05-09 DIAGNOSIS — Z0000 Encounter for general adult medical examination without abnormal findings: Secondary | ICD-10-CM

## 2020-05-09 DIAGNOSIS — Z79899 Other long term (current) drug therapy: Secondary | ICD-10-CM

## 2020-05-09 DIAGNOSIS — Z1211 Encounter for screening for malignant neoplasm of colon: Secondary | ICD-10-CM | POA: Diagnosis not present

## 2020-05-09 DIAGNOSIS — E118 Type 2 diabetes mellitus with unspecified complications: Secondary | ICD-10-CM

## 2020-05-09 DIAGNOSIS — F411 Generalized anxiety disorder: Secondary | ICD-10-CM

## 2020-05-09 DIAGNOSIS — E7800 Pure hypercholesterolemia, unspecified: Secondary | ICD-10-CM

## 2020-05-09 LAB — CBC WITH DIFFERENTIAL/PLATELET
Basophils Absolute: 0.1 10*3/uL (ref 0.0–0.1)
Basophils Relative: 1.3 % (ref 0.0–3.0)
Eosinophils Absolute: 0.1 10*3/uL (ref 0.0–0.7)
Eosinophils Relative: 1.2 % (ref 0.0–5.0)
HCT: 42.5 % (ref 36.0–46.0)
Hemoglobin: 14 g/dL (ref 12.0–15.0)
Lymphocytes Relative: 22.5 % (ref 12.0–46.0)
Lymphs Abs: 2.1 10*3/uL (ref 0.7–4.0)
MCHC: 32.9 g/dL (ref 30.0–36.0)
MCV: 87.7 fl (ref 78.0–100.0)
Monocytes Absolute: 0.8 10*3/uL (ref 0.1–1.0)
Monocytes Relative: 8.1 % (ref 3.0–12.0)
Neutro Abs: 6.4 10*3/uL (ref 1.4–7.7)
Neutrophils Relative %: 66.9 % (ref 43.0–77.0)
Platelets: 351 10*3/uL (ref 150.0–400.0)
RBC: 4.84 Mil/uL (ref 3.87–5.11)
RDW: 13.3 % (ref 11.5–15.5)
WBC: 9.5 10*3/uL (ref 4.0–10.5)

## 2020-05-09 LAB — COMPREHENSIVE METABOLIC PANEL
ALT: 21 U/L (ref 0–35)
AST: 15 U/L (ref 0–37)
Albumin: 4.1 g/dL (ref 3.5–5.2)
Alkaline Phosphatase: 154 U/L — ABNORMAL HIGH (ref 39–117)
BUN: 12 mg/dL (ref 6–23)
CO2: 27 mEq/L (ref 19–32)
Calcium: 9.3 mg/dL (ref 8.4–10.5)
Chloride: 104 mEq/L (ref 96–112)
Creatinine, Ser: 1.02 mg/dL (ref 0.40–1.20)
GFR: 62.41 mL/min (ref >60.00)
Glucose, Bld: 62 mg/dL — ABNORMAL LOW (ref 70–99)
Potassium: 4.5 mEq/L (ref 3.5–5.1)
Sodium: 140 mEq/L (ref 135–145)
Total Bilirubin: 1.3 mg/dL — ABNORMAL HIGH (ref 0.2–1.2)
Total Protein: 6.3 g/dL (ref 6.0–8.3)

## 2020-05-09 LAB — LIPID PANEL
Cholesterol: 120 mg/dL (ref 0–200)
HDL: 41.3 mg/dL (ref >39.00)
LDL Cholesterol: 52 mg/dL (ref 0–99)
NonHDL: 78.9
Total CHOL/HDL Ratio: 3
Triglycerides: 137 mg/dL (ref 0.0–149.0)
VLDL: 27.4 mg/dL (ref 0.0–40.0)

## 2020-05-09 LAB — TSH: TSH: 2.05 u[IU]/mL (ref 0.35–4.50)

## 2020-05-09 MED ORDER — EZETIMIBE 10 MG PO TABS
10.0000 mg | ORAL_TABLET | Freq: Every day | ORAL | 3 refills | Status: DC
Start: 2020-05-09 — End: 2021-05-09

## 2020-05-09 MED ORDER — VENLAFAXINE HCL ER 150 MG PO CP24
300.0000 mg | ORAL_CAPSULE | Freq: Every day | ORAL | 3 refills | Status: DC
Start: 2020-05-09 — End: 2021-05-09

## 2020-05-09 MED ORDER — TETANUS-DIPHTH-ACELL PERTUSSIS 5-2-15.5 LF-MCG/0.5 IM SUSP: 0.5000 mL | Freq: Once | INTRAMUSCULAR | 0 refills | Status: AC

## 2020-05-09 MED ORDER — CLONAZEPAM 2 MG PO TABS
ORAL_TABLET | ORAL | 5 refills | Status: DC
Start: 2020-05-09 — End: 2020-11-10

## 2020-05-09 NOTE — Addendum Note (Signed)
Addended by: Deveron Furlong D on: 05/09/2020 10:13 AM   Modules accepted: Orders
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
## 2020-05-10 ENCOUNTER — Telehealth: Payer: Self-pay | Admitting: Family Medicine

## 2020-05-10 NOTE — Telephone Encounter (Signed)
Patient returned call regarding lab results

## 2020-05-10 NOTE — Telephone Encounter (Signed)
Patient advised and voiced understanding.  

## 2020-05-11 LAB — DRUG MONITORING, PANEL 8 WITH CONFIRMATION, URINE
6 Acetylmorphine: NEGATIVE ng/mL (ref <10)
Alcohol Metabolites: NEGATIVE ng/mL
Alphahydroxyalprazolam: NEGATIVE ng/mL (ref <25)
Alphahydroxymidazolam: NEGATIVE ng/mL (ref <50)
Alphahydroxytriazolam: NEGATIVE ng/mL (ref <50)
Aminoclonazepam: 2143 ng/mL — ABNORMAL HIGH (ref <25)
Amphetamines: NEGATIVE ng/mL (ref <500)
Benzodiazepines: POSITIVE ng/mL — AB (ref <100)
Buprenorphine, Urine: NEGATIVE ng/mL (ref <5)
Cocaine Metabolite: NEGATIVE ng/mL (ref <150)
Creatinine: >300 mg/dL
Hydroxyethylflurazepam: NEGATIVE ng/mL (ref <50)
Lorazepam: NEGATIVE ng/mL (ref <50)
MDMA: NEGATIVE ng/mL (ref <500)
Marijuana Metabolite: NEGATIVE ng/mL (ref <20)
Nordiazepam: NEGATIVE ng/mL (ref <50)
Opiates: NEGATIVE ng/mL (ref <100)
Oxazepam: NEGATIVE ng/mL (ref <50)
Oxidant: NEGATIVE ug/mL
Oxycodone: NEGATIVE ng/mL (ref <100)
Temazepam: NEGATIVE ng/mL (ref <50)
pH: 6.5 (ref 4.5–9.0)

## 2020-05-11 LAB — DM TEMPLATE

## 2020-05-19 ENCOUNTER — Other Ambulatory Visit: Payer: Self-pay

## 2020-05-19 ENCOUNTER — Ambulatory Visit
Admission: RE | Admit: 2020-05-19 | Discharge: 2020-05-19 | Disposition: A | Discharge status: Home / Self Care | Payer: Medicare HMO | Source: Ambulatory Visit | Attending: Family Medicine | Admitting: Family Medicine

## 2020-05-19 DIAGNOSIS — Z1231 Encounter for screening mammogram for malignant neoplasm of breast: Secondary | ICD-10-CM

## 2020-05-26 ENCOUNTER — Telehealth: Payer: Self-pay

## 2020-05-26 NOTE — Telephone Encounter (Signed)
error 

## 2020-05-26 NOTE — Telephone Encounter (Signed)
A user error has taken place: encounter opened in error, closed for administrative reasons.

## 2020-06-16 ENCOUNTER — Ambulatory Visit: Payer: Medicare HMO | Admitting: Internal Medicine

## 2020-06-27 ENCOUNTER — Ambulatory Visit: Payer: Medicare HMO | Admitting: Internal Medicine

## 2020-06-27 ENCOUNTER — Encounter: Payer: Self-pay | Admitting: Internal Medicine

## 2020-06-27 ENCOUNTER — Other Ambulatory Visit: Payer: Self-pay

## 2020-06-27 VITALS — BP 130/80 | HR 91 | Ht 67.0 in | Wt 242.4 lb

## 2020-06-27 DIAGNOSIS — E1065 Type 1 diabetes mellitus with hyperglycemia: Secondary | ICD-10-CM

## 2020-06-27 LAB — POCT GLYCOSYLATED HEMOGLOBIN (HGB A1C): Hemoglobin A1C: 7.7 % — AB (ref 4.0–5.6)

## 2020-06-27 MED ORDER — METFORMIN HCL 1000 MG PO TABS
1000.0000 mg | ORAL_TABLET | Freq: Two times a day (BID) | ORAL | 3 refills | Status: DC
Start: 2020-06-27 — End: 2021-06-19

## 2020-06-27 MED ORDER — GLUCAGON 3 MG/DOSE NA POWD
3.0000 mg | Freq: Once | NASAL | 11 refills | Status: DC | PRN
Start: 2020-06-27 — End: 2021-04-10

## 2020-06-27 MED ORDER — FREESTYLE LIBRE 2 SENSOR MISC
1.0000 | 4 refills | Status: DC
Start: 2020-06-27 — End: 2020-11-22

## 2020-06-27 MED ORDER — FREESTYLE LIBRE 2 READER DEVI: 1.0000 | Freq: Every day | 0 refills | Status: DC
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
## 2020-06-27 NOTE — Addendum Note (Signed)
Addended by: Lauralyn Primes on: 06/27/2020 02:59 PM   Modules accepted: Orders

## 2020-06-27 NOTE — Patient Instructions (Addendum)
Please continue: - Metformin 1000 mg 2x a day  Please use the following pump settings: - basal rates:  12 am: 2.05 4 am: 2.8 9 am: 2.8 >> 2.5 12 pm: 2.5 - ICR:  12 am: 2.7 5 pm: 2.7 8 pm: 2.7 >> 3.4 - target: 100-110 - ISF:  12 am: 50 5 am: 35 9 pm: 50  Please do the following approximately 15 minutes before every meal: - Enter carbs (C) - Enter sugars (S) - Start insulin bolus (I)  Try to use a temporary basal rate of 50% when you exercise/work in the yard and possibly 1-2h later.  Use manual boluses for any bolus or above 20 units.  Please return in 4 months.

## 2020-06-28 ENCOUNTER — Encounter: Payer: Self-pay | Admitting: Internal Medicine

## 2020-07-19 DIAGNOSIS — Z20828 Contact with and (suspected) exposure to other viral communicable diseases: Secondary | ICD-10-CM | POA: Diagnosis not present

## 2020-07-20 ENCOUNTER — Other Ambulatory Visit: Payer: Self-pay

## 2020-07-20 ENCOUNTER — Encounter: Payer: HMO | Attending: Internal Medicine | Admitting: Nutrition

## 2020-07-20 DIAGNOSIS — E1065 Type 1 diabetes mellitus with hyperglycemia: Secondary | ICD-10-CM | POA: Insufficient documentation

## 2020-07-25 ENCOUNTER — Telehealth: Payer: Self-pay | Admitting: Nutrition

## 2020-07-25 NOTE — Telephone Encounter (Signed)
Kristie Basta, Do feel that this may be related to her basal rates or insulin to carb ratios?  Also, do know if she has an unexpired glucagon kit at home? Ty! C

## 2020-07-25 NOTE — Progress Notes (Addendum)
Patient came in with her Medtronic 630  pump saying she was not sure she entered the settings correctly on her pump.  She had not set there basal rate settings correctly, and had reported that her blood sugars were dropping low in the AM.  She reports also having cut back on her meal portion sizes and is wanting to loose weight.    New settings put in per DR. Gherge's orders on 06/27/20: MN: 2.05, 4AM: 2.8, 9AM: 2.5, 12PM: 2.5.  She had not changed the I/C ratio at 8PM.  This was changes from 2.7 to 3.4.  Target and ISF were set properly. She had no final questions.

## 2020-07-25 NOTE — Patient Instructions (Signed)
Review in manual how to make setting changes, or call the Medtronic help line for help with this.

## 2020-07-25 NOTE — Telephone Encounter (Signed)
Patient reports that FBS today was 35 and Saturday night, husband had to wake her with a low blood sugar.  She was sweaty and treated this without testing her blood sugar.

## 2020-07-27 NOTE — Telephone Encounter (Signed)
Message left on cell phone to call me to make changes to her pump settings.

## 2020-08-02 ENCOUNTER — Encounter: Payer: Self-pay | Admitting: Internal Medicine

## 2020-08-02 NOTE — Telephone Encounter (Signed)
Patient reports that not having any more lows during the day/night, now they are going very high after breakfast and staying in the 300s until supper.  Has not started on any new medications, or had any cortisone injections.  No changes to diet or insulin settings since last visit with Dr. Cruzita Lederer.  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

## 2020-08-02 NOTE — Telephone Encounter (Signed)
Vaughan Basta, Can you please check with her: - is she bolusing NovoLog consistently 15 min before breakfast and lunch? If so, we can strengthen her ICR from 1:2.7 to 1:2.4. - Also, Let's decreased at 4 AM basal from 2.8 units/h to 2.4 units/h Please let us know about the blood sugars in another 3 to 4 days.

## 2020-08-03 ENCOUNTER — Telehealth: Payer: Self-pay | Admitting: Nutrition

## 2020-08-03 NOTE — Telephone Encounter (Signed)
Kristie [name] says she is waiting 20 min. After bolusing before eating..   Changed basal rate as directed to 2.4 at 4AM . Patient is using exchanges.  So this number was changed from 5.4 to 6.25

## 2020-08-05 NOTE — Telephone Encounter (Signed)
Opened in error

## 2020-09-07 ENCOUNTER — Telehealth: Payer: Self-pay | Admitting: *Deleted

## 2020-09-07 ENCOUNTER — Encounter: Payer: Self-pay | Admitting: Gastroenterology

## 2020-09-07 ENCOUNTER — Other Ambulatory Visit: Payer: Self-pay

## 2020-09-07 ENCOUNTER — Ambulatory Visit (AMBULATORY_SURGERY_CENTER): Payer: Self-pay | Admitting: *Deleted

## 2020-09-07 VITALS — Ht 67.0 in | Wt 240.0 lb

## 2020-09-07 DIAGNOSIS — Z1211 Encounter for screening for malignant neoplasm of colon: Secondary | ICD-10-CM

## 2020-09-07 NOTE — Telephone Encounter (Signed)
[facility] Medical Clearance for Insulin Pump    Kristie [name] [facility] 08-01-1965 [id number]  Procedure: Colonoscopy Anesthesia type:  MAC Procedure Date: 09/21/20 Provider: Dr. Tarri Glenn  Type of Request: Recommendation re: Insulin Pump settings  Insulin Pump: Medtronic630G  Last OV: 06/27/20 Other: Telephone encounter noted from [name], RN for DOS 07/25/20 - Waits 20 minutes after bolus, then eats. Changed basal rate as directed to 2.4 at 4AM. Patient is using exchanges. So this number was changed from 5.4 to 6.25   In addition, we plan to advise pt to take Metformin as directed the day prior to her procedure (09/20/20), then hold the day of procedure (09/21/20). CBG's will be obtained during preop and high/low results addressed while in holding area.  Please review above request and advise with further instructions.  Thank you,  [facility]  Phone: [phone] Fax: [phone] ATTENTION: [name], LPN

## 2020-09-07 NOTE — Progress Notes (Signed)
Patient is here in-person for PV. Patient denies any allergies to eggs or soy. Patient denies any problems with anesthesia/sedation. Patient denies any oxygen use at home. Patient denies taking any diet/weight loss medications or blood thinners. Patient is not being treated for MRSA or C-diff. Patient is aware of our care-partner policy and OIZTI-45 safety protocol. Insulin pump request sent to Ammie, LPN.  Patient is fully COVID-19 vaccinated, per patient.

## 2020-09-07 NOTE — Telephone Encounter (Signed)
The patient is for a direct screening colonoscopy on 09/21/2020 with Dr.Beavers. Please send the insulin pump hold to her PCP Dr.Gherghe. Patient's PV was done today. She is aware we will get this information. Thank you, Abimelec Grochowski PV

## 2020-09-08 NOTE — Telephone Encounter (Signed)
Called pt to inform about all instructions below. LVM requesting returned call

## 2020-09-08 NOTE — Telephone Encounter (Signed)
Ammie, The plan sounds good.  She will need to continue only the basal rates and only do corrections if sugars are high while n.p.o. for the colonoscopy and resume mealtime bolusing when she starts eating normally afterwards. Sincerely Philemon Kingdom MD

## 2020-09-09 NOTE — Telephone Encounter (Signed)
SECOND ATTEMPT:  Called pt and LVM requesting returned call.

## 2020-09-09 NOTE — Telephone Encounter (Signed)
Pt returned call. Provided her with Dr. Tarri Glenn instructions below. Verbalized acceptance and understanding.

## 2020-09-21 ENCOUNTER — Ambulatory Visit (AMBULATORY_SURGERY_CENTER): Payer: HMO | Admitting: Gastroenterology

## 2020-09-21 ENCOUNTER — Encounter: Payer: Self-pay | Admitting: Gastroenterology

## 2020-09-21 ENCOUNTER — Other Ambulatory Visit: Payer: Self-pay

## 2020-09-21 VITALS — BP 106/77 | HR 91 | Temp 96.6°F | Resp 18 | Ht 67.0 in | Wt 240.0 lb

## 2020-09-21 DIAGNOSIS — D122 Benign neoplasm of ascending colon: Secondary | ICD-10-CM

## 2020-09-21 DIAGNOSIS — D125 Benign neoplasm of sigmoid colon: Secondary | ICD-10-CM | POA: Diagnosis not present

## 2020-09-21 DIAGNOSIS — D123 Benign neoplasm of transverse colon: Secondary | ICD-10-CM | POA: Diagnosis not present

## 2020-09-21 DIAGNOSIS — Z86010 Personal history of colonic polyps: Secondary | ICD-10-CM

## 2020-09-21 DIAGNOSIS — Z860101 Personal history of adenomatous and serrated colon polyps: Secondary | ICD-10-CM

## 2020-09-21 DIAGNOSIS — E119 Type 2 diabetes mellitus without complications: Secondary | ICD-10-CM | POA: Diagnosis not present

## 2020-09-21 DIAGNOSIS — Z1211 Encounter for screening for malignant neoplasm of colon: Secondary | ICD-10-CM | POA: Diagnosis not present

## 2020-09-21 HISTORY — DX: Personal history of colonic polyps: Z86.010

## 2020-09-21 HISTORY — DX: Personal history of adenomatous and serrated colon polyps: Z86.0101

## 2020-09-21 HISTORY — PX: COLONOSCOPY: SHX174

## 2020-09-21 MED ORDER — SODIUM CHLORIDE 0.9 % IV SOLN: 500.0000 mL | Freq: Once | INTRAVENOUS | Status: DC

## 2020-09-21 NOTE — Progress Notes (Signed)
Notified Dr. Tarri Glenn of patients blood sugar of 457.  Patient sates last night she was 65, turned her insulin pump off for the night. At 6am her sugar was 501, she turned her pump back on and it is giving her 12u of Novolin. Patient feels fine. No orders given per Dr. Tarri Glenn.

## 2020-09-21 NOTE — Progress Notes (Signed)
Pt's states no medical or surgical changes since previsit or office visit. VS by Neligh 

## 2020-09-21 NOTE — Op Note (Signed)
[facility] Patient Name: Kristie [name] Procedure Date: 09/21/2020 11:36 AM MRN: [id number] Endoscopist: Thornton Park MD, MD Age: 55 Referring MD:  Date of Birth: 05-15-1966 Gender: Female Account #: [id number] Procedure:                Colonoscopy Indications:              Screening for colorectal malignant neoplasm, This                            is the patient's first colonoscopy Medicines:                Monitored Anesthesia Care Procedure:                Pre-Anesthesia Assessment:                           - Prior to the procedure, a History and Physical                            was performed, and patient medications and                            allergies were reviewed. The patient's tolerance of                            previous anesthesia was also reviewed. The risks                            and benefits of the procedure and the sedation                            options and risks were discussed with the patient.                            All questions were answered, and informed consent                            was obtained. Prior Anticoagulants: The patient has                            taken no previous anticoagulant or antiplatelet                            agents. ASA Grade Assessment: II - A patient with                            mild systemic disease. After reviewing the risks                            and benefits, the patient was deemed in                            satisfactory condition to undergo the procedure.  After obtaining informed consent, the colonoscope                            was passed under direct vision. Throughout the                            procedure, the patient's blood pressure, pulse, and                            oxygen saturations were monitored continuously. The                            Olympus [id number]) [id number] was introduced                            through the anus and  advanced to the the cecum,                            identified by appendiceal orifice and ileocecal                            valve. The colonoscopy was performed without                            difficulty. The patient tolerated the procedure                            well. The quality of the bowel preparation was                            good. The terminal ileum, ileocecal valve,                            appendiceal orifice, and rectum were photographed. Scope In: 11:39:11 AM Scope Out: 11:54:32 AM Scope Withdrawal Time: 0 hours 12 minutes 9 seconds  Total Procedure Duration: 0 hours 15 minutes 21 seconds  Findings:                 The perianal and digital rectal examinations were                            normal.                           Non-bleeding internal hemorrhoids were found.                           A few small-mouthed diverticula were found in the                            sigmoid colon.                           A 3 mm polyp was found in the sigmoid colon. The  polyp was flat. The polyp was removed with a cold                            snare. Resection and retrieval were complete.                            Estimated blood loss was minimal.                           A 3 mm polyp was found in the hepatic flexure. The                            polyp was sessile. The polyp was removed with a                            cold snare. Resection and retrieval were complete.                            Estimated blood loss was minimal.                           A 4 mm polyp was found in the ascending colon. The                            polyp was sessile. The polyp was removed with a                            cold snare. Resection and retrieval were complete.                            Estimated blood loss was minimal.                           The exam was otherwise without abnormality on                            direct and retroflexion  views. Complications:            No immediate complications. Estimated blood loss:                            Minimal. Estimated Blood Loss:     Estimated blood loss was minimal. Impression:               - Non-bleeding internal hemorrhoids.                           - Diverticulosis in the sigmoid colon.                           - One 3 mm polyp in the sigmoid colon, removed with                            a cold snare. Resected and retrieved.                           -  One 3 mm polyp at the hepatic flexure, removed                            with a cold snare. Resected and retrieved.                           - One 4 mm polyp in the ascending colon, removed                            with a cold snare. Resected and retrieved.                           - The examination was otherwise normal on direct                            and retroflexion views. Recommendation:           - Patient has a contact number available for                            emergencies. The signs and symptoms of potential                            delayed complications were discussed with the                            patient. Return to normal activities tomorrow.                            Written discharge instructions were provided to the                            patient.                           - High fiber diet.                           - Continue present medications.                           - Await pathology results.                           - Repeat colonoscopy date to be determined after                            pending pathology results are reviewed for                            surveillance.                           - Emerging evidence supports eating a diet of  fruits, vegetables, grains, calcium, and yogurt                            while reducing red meat and alcohol may reduce the                            risk of colon cancer.                           -  Thank you for allowing me to be involved in your                            colon cancer prevention. Thornton Park MD, MD 09/21/2020 11:58:32 AM This report has been signed electronically.

## 2020-09-21 NOTE — Patient Instructions (Signed)
Handout on polyps given. ° °YOU HAD AN ENDOSCOPIC PROCEDURE TODAY AT THE [facility]:   Refer to the procedure report that was given to you for any specific questions about what was found during the examination.  If the procedure report does not answer your questions, please call your gastroenterologist to clarify.  If you requested that your care partner not be given the details of your procedure findings, then the procedure report has been included in a sealed envelope for you to review at your convenience later. ° °YOU SHOULD EXPECT: Some feelings of bloating in the abdomen. Passage of more gas than usual.  Walking can help get rid of the air that was put into your GI tract during the procedure and reduce the bloating. If you had a lower endoscopy (such as a colonoscopy or flexible sigmoidoscopy) you may notice spotting of blood in your stool or on the toilet paper. If you underwent a bowel prep for your procedure, you may not have a normal bowel movement for a few days. ° °Please Note:  You might notice some irritation and congestion in your nose or some drainage.  This is from the oxygen used during your procedure.  There is no need for concern and it should clear up in a day or so. ° °SYMPTOMS TO REPORT IMMEDIATELY: ° °Following lower endoscopy (colonoscopy or flexible sigmoidoscopy): ° Excessive amounts of blood in the stool ° Significant tenderness or worsening of abdominal pains ° Swelling of the abdomen that is new, acute ° Fever of 100°F or higher ° °For urgent or emergent issues, a gastroenterologist can be reached at any hour by calling [phone]. °Do not use MyChart messaging for urgent concerns.  ° ° °DIET:  We do recommend a small meal at first, but then you may proceed to your regular diet.  Drink plenty of fluids but you should avoid alcoholic beverages for 24 hours. ° °ACTIVITY:  You should plan to take it easy for the rest of today and you should NOT DRIVE or use heavy machinery  until tomorrow (because of the sedation medicines used during the test).   ° °FOLLOW UP: °Our staff will call the number listed on your records 48-72 hours following your procedure to check on you and address any questions or concerns that you may have regarding the information given to you following your procedure. If we do not reach you, we will leave a message.  We will attempt to reach you two times.  During this call, we will ask if you have developed any symptoms of COVID 19. If you develop any symptoms (ie: fever, flu-like symptoms, shortness of breath, cough etc.) before then, please call [phone].  If you test positive for Covid 19 in the 2 weeks post procedure, please call and report this information to us.   ° °If any biopsies were taken you will be contacted by phone or by letter within the next 1-3 weeks.  Please call us at [phone] if you have not heard about the biopsies in 3 weeks.  ° ° °SIGNATURES/CONFIDENTIALITY: °You and/or your care partner have signed paperwork which will be entered into your electronic medical record.  These signatures attest to the fact that that the information above on your After Visit Summary has been reviewed and is understood.  Full responsibility of the confidentiality of this discharge information lies with you and/or your care-partner.  °

## 2020-09-21 NOTE — Progress Notes (Signed)
Report to PACU, RN, vss, BBS= Clear.  

## 2020-09-21 NOTE — Progress Notes (Signed)
Called to room to assist during endoscopic procedure.  Patient ID and intended procedure confirmed with present staff. Received instructions for my participation in the procedure from the performing physician.  

## 2020-09-23 ENCOUNTER — Telehealth: Payer: Self-pay

## 2020-09-23 ENCOUNTER — Telehealth: Payer: Self-pay | Admitting: *Deleted

## 2020-09-23 NOTE — Telephone Encounter (Signed)
First attempt follow up call to pt, lm on vm 

## 2020-09-23 NOTE — Telephone Encounter (Signed)
No answer for post procedure call back, Left message for patient to call with questions or concerns. 

## 2020-09-27 ENCOUNTER — Encounter: Payer: Self-pay | Admitting: Gastroenterology

## 2020-09-27 NOTE — Progress Notes (Signed)
resul

## 2020-09-28 ENCOUNTER — Telehealth: Payer: Self-pay | Admitting: Internal Medicine

## 2020-09-28 NOTE — Telephone Encounter (Signed)
Kristie [name] with [employer] called to check on the status of the most recent office visit notes request on 3/11. They ask if that could be faxed back to them at [phone].

## 2020-09-28 NOTE — Telephone Encounter (Signed)
Last OV notes routed to [employer] at [phone] via [place].

## 2020-10-04 ENCOUNTER — Other Ambulatory Visit: Payer: Self-pay

## 2020-10-04 MED ORDER — ATORVASTATIN CALCIUM 80 MG PO TABS
80.0000 mg | ORAL_TABLET | Freq: Every day | ORAL | 1 refills | Status: DC
Start: 2020-10-04 — End: 2021-04-10

## 2020-10-25 ENCOUNTER — Ambulatory Visit: Payer: HMO | Admitting: Internal Medicine

## 2020-10-28 ENCOUNTER — Ambulatory Visit: Payer: Medicare HMO | Admitting: Internal Medicine

## 2020-11-04 ENCOUNTER — Telehealth: Payer: Self-pay | Admitting: Family Medicine

## 2020-11-04 NOTE — Telephone Encounter (Signed)
Rhonda with [employer] called to state that she is a Marine scientist with the diabetes management program through the patient's insurance. Suanne Marker has been reaching out to the patient but has been unable to speak with her and stated that she is required to give [place] this information. The patient is not required to call her back or participate in this phone program. If interested, Rhonda's callback number is [phone].

## 2020-11-04 NOTE — Telephone Encounter (Signed)
FYI  Please see below

## 2020-11-09 ENCOUNTER — Other Ambulatory Visit: Payer: Self-pay | Admitting: Family Medicine

## 2020-11-10 NOTE — Telephone Encounter (Signed)
Called pt to advise, and schedule appt. -Jma

## 2020-11-10 NOTE — Telephone Encounter (Signed)
Requesting: clonazepam  Contract: 05/09/20 UDS: 05/09/20 Last Visit: 05/09/20 Next Visit: not scheduled  Last Refill: 05/09/20  Please Advise

## 2020-11-10 NOTE — Telephone Encounter (Signed)
LM for pt to return call to discuss.  

## 2020-11-10 NOTE — Telephone Encounter (Signed)
1 mo supply clonaz eRx'd but pt needs f/u sometime in the next 1 mo-thx

## 2020-11-18 DIAGNOSIS — E1065 Type 1 diabetes mellitus with hyperglycemia: Secondary | ICD-10-CM | POA: Diagnosis not present

## 2020-11-22 ENCOUNTER — Other Ambulatory Visit: Payer: Self-pay | Admitting: Internal Medicine

## 2020-12-08 ENCOUNTER — Other Ambulatory Visit: Payer: Self-pay

## 2020-12-08 ENCOUNTER — Ambulatory Visit: Payer: HMO | Admitting: Internal Medicine

## 2020-12-08 ENCOUNTER — Encounter: Payer: Self-pay | Admitting: Internal Medicine

## 2020-12-08 VITALS — BP 110/80 | HR 61 | Ht 67.0 in | Wt 235.4 lb

## 2020-12-08 DIAGNOSIS — E1065 Type 1 diabetes mellitus with hyperglycemia: Secondary | ICD-10-CM

## 2020-12-08 DIAGNOSIS — E782 Mixed hyperlipidemia: Secondary | ICD-10-CM | POA: Diagnosis not present

## 2020-12-08 LAB — POCT GLYCOSYLATED HEMOGLOBIN (HGB A1C): Hemoglobin A1C: 7.7 % — AB (ref 4.0–5.6)
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
## 2020-12-08 NOTE — Patient Instructions (Addendum)
Please change: - basal rates:  12 am: 2.05 >> 1.90 4 am: 2.40  9 am: 2.5 12 pm: 2.45  9 pm: 2.45 >> 2.0 - ICR:  12 am: 2.4 5 am: 2.4  4:30 pm: 2.4 >> 2.8 - target: 100-110 - ISF:  12 am: 50 >> 60 5 am: 35 >> 50 4:30 pm: 35 >> 65  Please return in 3-4 months.

## 2020-12-13 ENCOUNTER — Encounter: Payer: Self-pay | Admitting: Family Medicine

## 2020-12-13 ENCOUNTER — Other Ambulatory Visit: Payer: Self-pay

## 2020-12-13 ENCOUNTER — Telehealth: Payer: Self-pay

## 2020-12-13 ENCOUNTER — Ambulatory Visit (INDEPENDENT_AMBULATORY_CARE_PROVIDER_SITE_OTHER): Payer: HMO | Admitting: Family Medicine

## 2020-12-13 ENCOUNTER — Other Ambulatory Visit: Payer: Self-pay | Admitting: Family Medicine

## 2020-12-13 VITALS — BP 100/60 | HR 90 | Temp 97.8°F | Resp 16 | Ht 67.0 in | Wt 237.0 lb

## 2020-12-13 DIAGNOSIS — F3342 Major depressive disorder, recurrent, in full remission: Secondary | ICD-10-CM

## 2020-12-13 DIAGNOSIS — Z0184 Encounter for antibody response examination: Secondary | ICD-10-CM | POA: Diagnosis not present

## 2020-12-13 DIAGNOSIS — F411 Generalized anxiety disorder: Secondary | ICD-10-CM

## 2020-12-13 DIAGNOSIS — Z23 Encounter for immunization: Secondary | ICD-10-CM | POA: Diagnosis not present

## 2020-12-13 DIAGNOSIS — Z0000 Encounter for general adult medical examination without abnormal findings: Secondary | ICD-10-CM

## 2020-12-13 DIAGNOSIS — E7800 Pure hypercholesterolemia, unspecified: Secondary | ICD-10-CM

## 2020-12-13 LAB — COMPREHENSIVE METABOLIC PANEL
ALT: 17 U/L (ref 0–35)
AST: 14 U/L (ref 0–37)
Albumin: 4.2 g/dL (ref 3.5–5.2)
Alkaline Phosphatase: 128 U/L — ABNORMAL HIGH (ref 39–117)
BUN: 19 mg/dL (ref 6–23)
CO2: 26 mEq/L (ref 19–32)
Calcium: 9.2 mg/dL (ref 8.4–10.5)
Chloride: 105 mEq/L (ref 96–112)
Creatinine, Ser: 0.91 mg/dL (ref 0.40–1.20)
GFR: 71.27 mL/min (ref >60.00)
Glucose, Bld: 49 mg/dL — CL (ref 70–99)
Potassium: 4.4 mEq/L (ref 3.5–5.1)
Sodium: 141 mEq/L (ref 135–145)
Total Bilirubin: 1.3 mg/dL — ABNORMAL HIGH (ref 0.2–1.2)
Total Protein: 6.4 g/dL (ref 6.0–8.3)

## 2020-12-13 LAB — LIPID PANEL
Cholesterol: 81 mg/dL (ref 0–200)
HDL: 38.4 mg/dL — ABNORMAL LOW (ref >39.00)
LDL Cholesterol: 29 mg/dL (ref 0–99)
NonHDL: 42.88
Total CHOL/HDL Ratio: 2
Triglycerides: 68 mg/dL (ref 0.0–149.0)
VLDL: 13.6 mg/dL (ref 0.0–40.0)

## 2020-12-13 MED ORDER — CLONAZEPAM 1 MG PO TABS: 1.0000 mg | ORAL_TABLET | Freq: Three times a day (TID) | ORAL | 5 refills | Status: DC | PRN

## 2020-12-13 NOTE — Progress Notes (Signed)
A user error has taken place: encounter opened in error, closed for administrative reasons, medication ordered in error, not dispensed to this patient (monoclonal Ab infusion).Kristie [name]

## 2020-12-13 NOTE — Addendum Note (Signed)
Addended by: Deveron Furlong D on: 12/13/2020 09:51 AM   Modules accepted: Orders
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
## 2020-12-13 NOTE — Telephone Encounter (Signed)
Received critical CBG of 49.   Pt stated in visit that she was fasting and was going to eat as soon as she left the office (breakfast in the car).

## 2020-12-14 LAB — VARICELLA ZOSTER ANTIBODY, IGG: Varicella IgG: 204.8 index

## 2021-02-03 ENCOUNTER — Other Ambulatory Visit: Payer: Self-pay | Admitting: Internal Medicine

## 2021-02-15 ENCOUNTER — Ambulatory Visit (INDEPENDENT_AMBULATORY_CARE_PROVIDER_SITE_OTHER): Payer: HMO | Admitting: *Deleted

## 2021-02-15 DIAGNOSIS — Z0000 Encounter for general adult medical examination without abnormal findings: Secondary | ICD-10-CM

## 2021-02-15 NOTE — Patient Instructions (Addendum)
Ms. Kristie [name] , Thank you for taking time to come for your Medicare Wellness Visit. I appreciate your ongoing commitment to your health goals. Please review the following plan we discussed and let me know if I can assist you in the future.   Screening recommendations/referrals: Colonoscopy: up to date Mammogram: up to date  Recommended yearly ophthalmology/optometry visit for glaucoma screening and checkup Recommended yearly dental visit for hygiene and checkup  Vaccinations: Influenza vaccine: up to date Pneumococcal vaccine: up to date Tdap vaccine: up to date Shingles vaccine: Education provided    Advanced directives: education provided  Conditions/risks identified: na  Next appointment: 05-18-2021 @ 9:00   Dr. Anitra Lauth   Preventive Care Preventive care refers to lifestyle choices and visits with your health care provider that can promote health and wellness. What does preventive care include? A yearly physical exam. This is also called an annual well check. Dental exams once or twice a year. Routine eye exams. Ask your health care provider how often you should have your eyes checked. Personal lifestyle choices, including: Daily care of your teeth and gums. Regular physical activity. Eating a healthy diet. Avoiding tobacco and drug use. Limiting alcohol use. Practicing safe sex. Taking low-dose aspirin every day. Taking vitamin and mineral supplements as recommended by your health care provider. What happens during an annual well check? The services and screenings done by your health care provider during your annual well check will depend on your age, overall health, lifestyle risk factors, and family history of disease. Counseling  Your health care provider may ask you questions about your: Alcohol use. Tobacco use. Drug use. Emotional well-being. Home and relationship well-being. Sexual activity. Eating habits. History of falls. Memory and ability to understand  (cognition). Work and work Statistician. Reproductive health. Screening  You may have the following tests or measurements: Height, weight, and BMI. Blood pressure. Lipid and cholesterol levels. These may be checked every 5 years, or more frequently if you are over 34 years old. Skin check. Lung cancer screening. You may have this screening every year starting at age 1 if you have a 30-pack-year history of smoking and currently smoke or have quit within the past 15 years. Fecal occult blood test (FOBT) of the stool. You may have this test every year starting at age 38. Flexible sigmoidoscopy or colonoscopy. You may have a sigmoidoscopy every 5 years or a colonoscopy every 10 years starting at age 15. Hepatitis C blood test. Hepatitis B blood test. Sexually transmitted disease (STD) testing. Diabetes screening. This is done by checking your blood sugar (glucose) after you have not eaten for a while (fasting). You may have this done every 1-3 years. Bone density scan. This is done to screen for osteoporosis. You may have this done starting at age 46. Mammogram. This may be done every 1-2 years. Talk to your health care provider about how often you should have regular mammograms. Talk with your health care provider about your test results, treatment options, and if necessary, the need for more tests. Vaccines  Your health care provider may recommend certain vaccines, such as: Influenza vaccine. This is recommended every year. Tetanus, diphtheria, and acellular pertussis (Tdap, Td) vaccine. You may need a Td booster every 10 years. Zoster vaccine. You may need this after age 50. Pneumococcal 13-valent conjugate (PCV13) vaccine. One dose is recommended after age 74. Pneumococcal polysaccharide (PPSV23) vaccine. One dose is recommended after age 43. Talk to your health care provider about which screenings and vaccines you  need and how often you need them. This information is not intended to  replace advice given to you by your health care provider. Make sure you discuss any questions you have with your health care provider. Document Released: 07/29/2015 Document Revised: 03/21/2016 Document Reviewed: 05/03/2015 Elsevier Interactive Patient Education  2017 [place] Prevention in the Home Falls can cause injuries. They can happen to people of all ages. There are many things you can do to make your home safe and to help prevent falls. What can I do on the outside of my home? Regularly fix the edges of walkways and driveways and fix any cracks. Remove anything that might make you trip as you walk through a door, such as a raised step or threshold. Trim any bushes or trees on the path to your home. Use bright outdoor lighting. Clear any walking paths of anything that might make someone trip, such as rocks or tools. Regularly check to see if handrails are loose or broken. Make sure that both sides of any steps have handrails. Any raised decks and porches should have guardrails on the edges. Have any leaves, snow, or ice cleared regularly. Use sand or salt on walking paths during winter. Clean up any spills in your garage right away. This includes oil or grease spills. What can I do in the bathroom? Use night lights. Install grab bars by the toilet and in the tub and shower. Do not use towel bars as grab bars. Use non-skid mats or decals in the tub or shower. If you need to sit down in the shower, use a plastic, non-slip stool. Keep the floor dry. Clean up any water that spills on the floor as soon as it happens. Remove soap buildup in the tub or shower regularly. Attach bath mats securely with double-sided non-slip rug tape. Do not have throw rugs and other things on the floor that can make you trip. What can I do in the bedroom? Use night lights. Make sure that you have a light by your bed that is easy to reach. Do not use any sheets or blankets that are too big for  your bed. They should not hang down onto the floor. Have a firm chair that has side arms. You can use this for support while you get dressed. Do not have throw rugs and other things on the floor that can make you trip. What can I do in the kitchen? Clean up any spills right away. Avoid walking on wet floors. Keep items that you use a lot in easy-to-reach places. If you need to reach something above you, use a strong step stool that has a grab bar. Keep electrical cords out of the way. Do not use floor polish or wax that makes floors slippery. If you must use wax, use non-skid floor wax. Do not have throw rugs and other things on the floor that can make you trip. What can I do with my stairs? Do not leave any items on the stairs. Make sure that there are handrails on both sides of the stairs and use them. Fix handrails that are broken or loose. Make sure that handrails are as long as the stairways. Check any carpeting to make sure that it is firmly attached to the stairs. Fix any carpet that is loose or worn. Avoid having throw rugs at the top or bottom of the stairs. If you do have throw rugs, attach them to the floor with carpet tape. Make sure that  you have a light switch at the top of the stairs and the bottom of the stairs. If you do not have them, ask someone to add them for you. What else can I do to help prevent falls? Wear shoes that: Do not have high heels. Have rubber bottoms. Are comfortable and fit you well. Are closed at the toe. Do not wear sandals. If you use a stepladder: Make sure that it is fully opened. Do not climb a closed stepladder. Make sure that both sides of the stepladder are locked into place. Ask someone to hold it for you, if possible. Clearly mark and make sure that you can see: Any grab bars or handrails. First and last steps. Where the edge of each step is. Use tools that help you move around (mobility aids) if they are needed. These  include: Canes. Walkers. Scooters. Crutches. Turn on the lights when you go into a dark area. Replace any light bulbs as soon as they burn out. Set up your furniture so you have a clear path. Avoid moving your furniture around. If any of your floors are uneven, fix them. If there are any pets around you, be aware of where they are. Review your medicines with your doctor. Some medicines can make you feel dizzy. This can increase your chance of falling. Ask your doctor what other things that you can do to help prevent falls. This information is not intended to replace advice given to you by your health care provider. Make sure you discuss any questions you have with your health care provider. Document Released: 04/28/2009 Document Revised: 12/08/2015 Document Reviewed: 08/06/2014 Elsevier Interactive Patient Education  2017 [employer].
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
## 2021-04-09 ENCOUNTER — Other Ambulatory Visit: Payer: Self-pay | Admitting: Family Medicine

## 2021-04-10 ENCOUNTER — Ambulatory Visit (INDEPENDENT_AMBULATORY_CARE_PROVIDER_SITE_OTHER): Payer: HMO | Admitting: Internal Medicine

## 2021-04-10 ENCOUNTER — Encounter: Payer: Self-pay | Admitting: Internal Medicine

## 2021-04-10 ENCOUNTER — Other Ambulatory Visit: Payer: Self-pay

## 2021-04-10 VITALS — BP 128/80 | HR 114 | Ht 67.0 in | Wt 233.8 lb

## 2021-04-10 DIAGNOSIS — E782 Mixed hyperlipidemia: Secondary | ICD-10-CM

## 2021-04-10 DIAGNOSIS — E1065 Type 1 diabetes mellitus with hyperglycemia: Secondary | ICD-10-CM

## 2021-04-10 LAB — POCT GLYCOSYLATED HEMOGLOBIN (HGB A1C): Hemoglobin A1C: 9.1 % — AB (ref 4.0–5.6)

## 2021-04-10 MED ORDER — FREESTYLE LIBRE 2 SENSOR MISC
3 refills | Status: DC
Start: 2021-04-10 — End: 2022-02-19

## 2021-04-10 MED ORDER — GLUCAGON 3 MG/DOSE NA POWD: 3.0000 mg | Freq: Once | NASAL | 11 refills | Status: AC | PRN
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
## 2021-04-10 NOTE — Patient Instructions (Addendum)
Please use the following pump settings: - basal rates:  12 am: 1.90 >> 1.8 4 am: 2.40  >> 2.2 9 am: 2.5 12 pm: 2.45  9 pm: 2.0 >> 1.8 - ICR:  12 am: 2.4 5 am: 2.4  4:30 pm: 2.8 - target: 100-120 >> 100-130 - ISF:  12 am: 60 5 am: 50 4:30 pm: 65  Please do the following approximately 15 minutes before every meal: - Enter carbs (C) - Enter sugars (S) - Start insulin bolus (I)  If you forget to bolus before the meal: - if you bolus within 1 hour from starting the mel, enter 50% of the carbs  - if you bolus > 1h from starting the meal, only do a correction  Please return in 3-4 months.

## 2021-04-10 NOTE — Addendum Note (Signed)
Addended by: Lauralyn Primes on: 04/10/2021 03:28 PM   Modules accepted: Orders

## 2021-04-27 ENCOUNTER — Other Ambulatory Visit: Payer: Self-pay | Admitting: Family Medicine

## 2021-04-27 DIAGNOSIS — Z1231 Encounter for screening mammogram for malignant neoplasm of breast: Secondary | ICD-10-CM

## 2021-05-09 ENCOUNTER — Other Ambulatory Visit: Payer: Self-pay | Admitting: Family Medicine

## 2021-05-11 ENCOUNTER — Ambulatory Visit (INDEPENDENT_AMBULATORY_CARE_PROVIDER_SITE_OTHER): Payer: HMO | Admitting: Family Medicine

## 2021-05-11 ENCOUNTER — Other Ambulatory Visit: Payer: Self-pay

## 2021-05-11 VITALS — BP 100/71 | HR 101 | Temp 97.9°F | Ht 67.0 in | Wt 233.8 lb

## 2021-05-11 DIAGNOSIS — L989 Disorder of the skin and subcutaneous tissue, unspecified: Secondary | ICD-10-CM

## 2021-05-11 MED ORDER — FLUTICASONE PROPIONATE 0.05 % EX CREA: TOPICAL_CREAM | CUTANEOUS | 0 refills | Status: DC
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
## 2021-05-18 ENCOUNTER — Ambulatory Visit (INDEPENDENT_AMBULATORY_CARE_PROVIDER_SITE_OTHER): Payer: HMO | Admitting: Family Medicine

## 2021-05-18 ENCOUNTER — Other Ambulatory Visit: Payer: Self-pay

## 2021-05-18 ENCOUNTER — Encounter: Payer: Self-pay | Admitting: Family Medicine

## 2021-05-18 VITALS — BP 102/69 | HR 93 | Temp 97.7°F | Ht 67.75 in | Wt 233.2 lb

## 2021-05-18 DIAGNOSIS — Z0000 Encounter for general adult medical examination without abnormal findings: Secondary | ICD-10-CM

## 2021-05-18 DIAGNOSIS — Z23 Encounter for immunization: Secondary | ICD-10-CM

## 2021-05-18 DIAGNOSIS — F411 Generalized anxiety disorder: Secondary | ICD-10-CM | POA: Diagnosis not present

## 2021-05-18 DIAGNOSIS — Z79899 Other long term (current) drug therapy: Secondary | ICD-10-CM | POA: Diagnosis not present

## 2021-05-18 DIAGNOSIS — E7800 Pure hypercholesterolemia, unspecified: Secondary | ICD-10-CM

## 2021-05-18 DIAGNOSIS — F3342 Major depressive disorder, recurrent, in full remission: Secondary | ICD-10-CM | POA: Diagnosis not present

## 2021-05-18 LAB — CBC WITH DIFFERENTIAL/PLATELET
Basophils Absolute: 0.1 10*3/uL (ref 0.0–0.1)
Basophils Relative: 1 % (ref 0.0–3.0)
Eosinophils Absolute: 0.1 10*3/uL (ref 0.0–0.7)
Eosinophils Relative: 1.3 % (ref 0.0–5.0)
HCT: 41.3 % (ref 36.0–46.0)
Hemoglobin: 13.5 g/dL (ref 12.0–15.0)
Lymphocytes Relative: 28.8 % (ref 12.0–46.0)
Lymphs Abs: 2 10*3/uL (ref 0.7–4.0)
MCHC: 32.6 g/dL (ref 30.0–36.0)
MCV: 86.9 fl (ref 78.0–100.0)
Monocytes Absolute: 0.6 10*3/uL (ref 0.1–1.0)
Monocytes Relative: 9.1 % (ref 3.0–12.0)
Neutro Abs: 4.1 10*3/uL (ref 1.4–7.7)
Neutrophils Relative %: 59.8 % (ref 43.0–77.0)
Platelets: 313 10*3/uL (ref 150.0–400.0)
RBC: 4.75 Mil/uL (ref 3.87–5.11)
RDW: 13.3 % (ref 11.5–15.5)
WBC: 6.9 10*3/uL (ref 4.0–10.5)

## 2021-05-18 LAB — LIPID PANEL
Cholesterol: 100 mg/dL (ref 0–200)
HDL: 43.4 mg/dL (ref >39.00)
LDL Cholesterol: 42 mg/dL (ref 0–99)
NonHDL: 56.63
Total CHOL/HDL Ratio: 2
Triglycerides: 73 mg/dL (ref 0.0–149.0)
VLDL: 14.6 mg/dL (ref 0.0–40.0)

## 2021-05-18 LAB — COMPREHENSIVE METABOLIC PANEL
ALT: 51 U/L — ABNORMAL HIGH (ref 0–35)
AST: 36 U/L (ref 0–37)
Albumin: 4 g/dL (ref 3.5–5.2)
Alkaline Phosphatase: 147 U/L — ABNORMAL HIGH (ref 39–117)
BUN: 24 mg/dL — ABNORMAL HIGH (ref 6–23)
CO2: 28 mEq/L (ref 19–32)
Calcium: 8.8 mg/dL (ref 8.4–10.5)
Chloride: 102 mEq/L (ref 96–112)
Creatinine, Ser: 0.89 mg/dL (ref 0.40–1.20)
GFR: 72.98 mL/min (ref >60.00)
Glucose, Bld: 331 mg/dL — ABNORMAL HIGH (ref 70–99)
Potassium: 5 mEq/L (ref 3.5–5.1)
Sodium: 136 mEq/L (ref 135–145)
Total Bilirubin: 1.1 mg/dL (ref 0.2–1.2)
Total Protein: 6.3 g/dL (ref 6.0–8.3)

## 2021-05-18 LAB — TSH: TSH: 1.13 u[IU]/mL (ref 0.35–5.50)
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
## 2021-05-21 LAB — DRUG MONITORING PANEL 376104, URINE
Alphahydroxyalprazolam: NEGATIVE ng/mL (ref <25)
Alphahydroxymidazolam: NEGATIVE ng/mL (ref <50)
Alphahydroxytriazolam: NEGATIVE ng/mL (ref <50)
Aminoclonazepam: 484 ng/mL — ABNORMAL HIGH (ref <25)
Amphetamines: NEGATIVE ng/mL (ref <500)
Barbiturates: NEGATIVE ng/mL (ref <300)
Benzodiazepines: POSITIVE ng/mL — AB (ref <100)
Cocaine Metabolite: NEGATIVE ng/mL (ref <150)
Desmethyltramadol: NEGATIVE ng/mL (ref <100)
Hydroxyethylflurazepam: NEGATIVE ng/mL (ref <50)
Lorazepam: NEGATIVE ng/mL (ref <50)
Nordiazepam: NEGATIVE ng/mL (ref <50)
Opiates: NEGATIVE ng/mL (ref <100)
Oxazepam: NEGATIVE ng/mL (ref <50)
Oxycodone: NEGATIVE ng/mL (ref <100)
Temazepam: NEGATIVE ng/mL (ref <50)
Tramadol: NEGATIVE ng/mL (ref <100)

## 2021-05-21 LAB — DM TEMPLATE

## 2021-05-24 ENCOUNTER — Telehealth: Payer: Self-pay

## 2021-05-24 NOTE — Telephone Encounter (Signed)
Called and advised pt D.O.[employer] paperwork completed and at the front desk waiting for her to pick up.

## 2021-05-25 NOTE — Telephone Encounter (Signed)
Pt picked up DOT paperwork

## 2021-05-30 ENCOUNTER — Other Ambulatory Visit: Payer: Self-pay

## 2021-05-30 ENCOUNTER — Ambulatory Visit
Admission: RE | Admit: 2021-05-30 | Discharge: 2021-05-30 | Disposition: A | Discharge status: Home / Self Care | Payer: HMO | Source: Ambulatory Visit | Attending: Family Medicine | Admitting: Family Medicine

## 2021-05-30 DIAGNOSIS — Z1231 Encounter for screening mammogram for malignant neoplasm of breast: Secondary | ICD-10-CM | POA: Diagnosis not present

## 2021-06-02 ENCOUNTER — Other Ambulatory Visit: Payer: Self-pay | Admitting: Family Medicine

## 2021-06-19 ENCOUNTER — Other Ambulatory Visit: Payer: Self-pay | Admitting: Internal Medicine

## 2021-06-20 DIAGNOSIS — R252 Cramp and spasm: Secondary | ICD-10-CM | POA: Diagnosis not present

## 2021-06-20 DIAGNOSIS — R4020 Unspecified coma: Secondary | ICD-10-CM | POA: Diagnosis not present

## 2021-06-20 DIAGNOSIS — E161 Other hypoglycemia: Secondary | ICD-10-CM | POA: Diagnosis not present

## 2021-06-20 DIAGNOSIS — E162 Hypoglycemia, unspecified: Secondary | ICD-10-CM | POA: Diagnosis not present

## 2021-06-20 DIAGNOSIS — R404 Transient alteration of awareness: Secondary | ICD-10-CM | POA: Diagnosis not present

## 2021-07-01 ENCOUNTER — Other Ambulatory Visit: Payer: Self-pay | Admitting: Internal Medicine

## 2021-07-06 ENCOUNTER — Other Ambulatory Visit: Payer: Self-pay | Admitting: Family Medicine

## 2021-07-18 DIAGNOSIS — R4020 Unspecified coma: Secondary | ICD-10-CM | POA: Diagnosis not present

## 2021-07-18 DIAGNOSIS — R61 Generalized hyperhidrosis: Secondary | ICD-10-CM | POA: Diagnosis not present

## 2021-07-18 DIAGNOSIS — R404 Transient alteration of awareness: Secondary | ICD-10-CM | POA: Diagnosis not present

## 2021-07-18 DIAGNOSIS — E161 Other hypoglycemia: Secondary | ICD-10-CM | POA: Diagnosis not present

## 2021-07-18 DIAGNOSIS — E162 Hypoglycemia, unspecified: Secondary | ICD-10-CM | POA: Diagnosis not present

## 2021-07-19 ENCOUNTER — Encounter (HOSPITAL_BASED_OUTPATIENT_CLINIC_OR_DEPARTMENT_OTHER): Payer: Self-pay

## 2021-07-19 ENCOUNTER — Emergency Department (HOSPITAL_BASED_OUTPATIENT_CLINIC_OR_DEPARTMENT_OTHER)
Admission: EM | Admit: 2021-07-19 | Discharge: 2021-07-20 | Disposition: A | Discharge status: Home / Self Care | Payer: HMO | Attending: Emergency Medicine | Admitting: Emergency Medicine

## 2021-07-19 ENCOUNTER — Other Ambulatory Visit: Payer: Self-pay

## 2021-07-19 DIAGNOSIS — Z794 Long term (current) use of insulin: Secondary | ICD-10-CM | POA: Diagnosis not present

## 2021-07-19 DIAGNOSIS — E10649 Type 1 diabetes mellitus with hypoglycemia without coma: Secondary | ICD-10-CM | POA: Insufficient documentation

## 2021-07-19 DIAGNOSIS — Z7984 Long term (current) use of oral hypoglycemic drugs: Secondary | ICD-10-CM | POA: Diagnosis not present

## 2021-07-19 DIAGNOSIS — N9489 Other specified conditions associated with female genital organs and menstrual cycle: Secondary | ICD-10-CM | POA: Insufficient documentation

## 2021-07-19 DIAGNOSIS — R112 Nausea with vomiting, unspecified: Secondary | ICD-10-CM | POA: Diagnosis present

## 2021-07-19 DIAGNOSIS — E1010 Type 1 diabetes mellitus with ketoacidosis without coma: Secondary | ICD-10-CM | POA: Insufficient documentation

## 2021-07-19 DIAGNOSIS — E1065 Type 1 diabetes mellitus with hyperglycemia: Secondary | ICD-10-CM | POA: Diagnosis not present

## 2021-07-19 DIAGNOSIS — R0602 Shortness of breath: Secondary | ICD-10-CM | POA: Diagnosis not present

## 2021-07-19 DIAGNOSIS — R252 Cramp and spasm: Secondary | ICD-10-CM | POA: Diagnosis not present

## 2021-07-19 DIAGNOSIS — R000 Tachycardia, unspecified: Secondary | ICD-10-CM | POA: Diagnosis not present

## 2021-07-19 DIAGNOSIS — R739 Hyperglycemia, unspecified: Secondary | ICD-10-CM

## 2021-07-19 LAB — URINALYSIS, ROUTINE W REFLEX MICROSCOPIC
Bilirubin Urine: NEGATIVE
Glucose, UA: >1000 mg/dL — AB
Hgb urine dipstick: NEGATIVE
Ketones, ur: 15 mg/dL — AB
Nitrite: NEGATIVE
Protein, ur: NEGATIVE mg/dL
Specific Gravity, Urine: 1.031 — ABNORMAL HIGH (ref 1.005–1.030)
pH: 5.5 (ref 5.0–8.0)

## 2021-07-19 LAB — CBG MONITORING, ED
Glucose-Capillary: >600 mg/dL (ref 70–99)
Glucose-Capillary: 516 mg/dL (ref 70–99)

## 2021-07-19 LAB — CBC
HCT: 42.7 % (ref 36.0–46.0)
Hemoglobin: 13.6 g/dL (ref 12.0–15.0)
MCH: 28.1 pg (ref 26.0–34.0)
MCHC: 31.9 g/dL (ref 30.0–36.0)
MCV: 88.2 fL (ref 80.0–100.0)
Platelets: 313 10*3/uL (ref 150–400)
RBC: 4.84 MIL/uL (ref 3.87–5.11)
RDW: 13.2 % (ref 11.5–15.5)
WBC: 16.4 10*3/uL — ABNORMAL HIGH (ref 4.0–10.5)
nRBC: 0 % (ref 0.0–0.2)

## 2021-07-19 LAB — PREGNANCY, URINE: Preg Test, Ur: NEGATIVE

## 2021-07-19 LAB — I-STAT VENOUS BLOOD GAS, ED
Acid-Base Excess: 0 mmol/L (ref 0.0–2.0)
Bicarbonate: 25.7 mmol/L (ref 20.0–28.0)
Calcium, Ion: 1.09 mmol/L — ABNORMAL LOW (ref 1.15–1.40)
HCT: 42 % (ref 36.0–46.0)
Hemoglobin: 14.3 g/dL (ref 12.0–15.0)
O2 Saturation: 66 %
Potassium: 7.1 mmol/L (ref 3.5–5.1)
Sodium: 128 mmol/L — ABNORMAL LOW (ref 135–145)
TCO2: 27 mmol/L (ref 22–32)
pCO2, Ven: 42.3 mmHg — ABNORMAL LOW (ref 44.0–60.0)
pH, Ven: 7.391 (ref 7.250–7.430)
pO2, Ven: 35 mmHg (ref 32.0–45.0)

## 2021-07-19 LAB — BASIC METABOLIC PANEL
Anion gap: 14 (ref 5–15)
Anion gap: 14 (ref 5–15)
BUN: 23 mg/dL — ABNORMAL HIGH (ref 6–20)
BUN: 22 mg/dL — ABNORMAL HIGH (ref 6–20)
CO2: 23 mmol/L (ref 22–32)
CO2: 20 mmol/L — ABNORMAL LOW (ref 22–32)
Calcium: 9.2 mg/dL (ref 8.9–10.3)
Calcium: 7.9 mg/dL — ABNORMAL LOW (ref 8.9–10.3)
Chloride: 92 mmol/L — ABNORMAL LOW (ref 98–111)
Chloride: 103 mmol/L (ref 98–111)
Creatinine, Ser: 1.33 mg/dL — ABNORMAL HIGH (ref 0.44–1.00)
Creatinine, Ser: 1.22 mg/dL — ABNORMAL HIGH (ref 0.44–1.00)
GFR, Estimated: 47 mL/min — ABNORMAL LOW (ref >60)
GFR, Estimated: 52 mL/min — ABNORMAL LOW (ref >60)
Glucose, Bld: 785 mg/dL (ref 70–99)
Glucose, Bld: 562 mg/dL (ref 70–99)
Potassium: 6 mmol/L — ABNORMAL HIGH (ref 3.5–5.1)
Potassium: 3.8 mmol/L (ref 3.5–5.1)
Sodium: 129 mmol/L — ABNORMAL LOW (ref 135–145)
Sodium: 137 mmol/L (ref 135–145)

## 2021-07-19 LAB — HM DIABETES FOOT EXAM

## 2021-07-19 MED ORDER — SODIUM CHLORIDE 0.9 % IV BOLUS
1000.0000 mL | Freq: Once | INTRAVENOUS | Status: AC
  Administered 2021-07-19: 1000 mL via INTRAVENOUS

## 2021-07-19 MED ORDER — INSULIN ASPART 100 UNIT/ML IJ SOLN
10.0000 [IU] | Freq: Once | INTRAMUSCULAR | Status: AC
Start: 2021-07-19 — End: 2021-07-19
  Administered 2021-07-19: 10 [IU] via INTRAVENOUS

## 2021-07-19 MED ORDER — ONDANSETRON HCL 4 MG/2ML IJ SOLN
4.0000 mg | Freq: Once | INTRAMUSCULAR | Status: AC
  Administered 2021-07-19: 4 mg via INTRAVENOUS
  Filled 2021-07-19: qty 2

## 2021-07-19 MED ORDER — INSULIN ASPART 100 UNIT/ML IV SOLN
10.0000 [IU] | Freq: Once | INTRAVENOUS | Status: AC
  Administered 2021-07-19: 10 [IU] via INTRAVENOUS

## 2021-07-19 NOTE — ED Provider Notes (Signed)
[facility] EMERGENCY DEPT Provider Note   CSN: [id number] Arrival date & time: 07/19/21  1909     History  Chief Complaint  Patient presents with   Blood Sugar Problem    Kristie [name] is a 57 y.o. female with a past medical history significant for type 1 diabetes who presents with concern for elevated blood glucose, crampy muscle pain, shortness of breath, as well as some nausea and vomiting.  Patient reports that she has had labile control of her blood sugar recently, with recent EMS visit for hypoglycemia yesterday, returned to normal blood glucose, and now hyperglycemia in less than 24 hours.  Her significant other reports that she has frequent episodes of hypoglycemia, and he administers glucagon intranasally, or dextrose tablets when he notices that she has critically low blood glucose.  Patient reports that she does 25 to 30 units of NovoLog with meals, as well as sliding scale insulin.  Patient does not have any base long-acting insulin.  She also takes metformin 1000 mg twice daily.  Patient reports that she has had 1 previous episode of DKA.  Patient denies confusion, chest pain, fever, sore throat, cough at this time.  HPI     Home Medications Prior to Admission medications   Medication Sig Start Date End Date Taking? Authorizing Provider  Alcohol Swabs (ALCOHOL PREP) 70 % PADS USE TO TEST BLOOD SUGAR FOUR TIMES DAILY AS DIRECTED 06/07/15   Philemon Kingdom, MD  atorvastatin (LIPITOR) 80 MG tablet TAKE 1 TABLET BY MOUTH EVERY DAY 07/06/21   McGowen, Adrian Blackwater, MD  clonazePAM (KLONOPIN) 1 MG tablet Take 1 tablet (1 mg total) by mouth 3 (three) times daily as needed for anxiety. 12/13/20   McGowen, Adrian Blackwater, MD  Continuous Blood Gluc Sensor (FREESTYLE LIBRE 2 SENSOR) MISC USE AS DIRECTED EVERY  14  DAYS 04/10/21   Philemon Kingdom, MD  ezetimibe (ZETIA) 10 MG tablet TAKE 1 TABLET BY MOUTH EVERY DAY 06/05/21   McGowen, Adrian Blackwater, MD  fluticasone (CUTIVATE) 0.05 %  cream Apply to red spot on leg twice a day 05/11/21   McGowen, Adrian Blackwater, MD  Glucagon 3 MG/DOSE POWD Place 3 mg into the nose once as needed for up to 1 dose. 04/10/21   Philemon Kingdom, MD  metFORMIN (GLUCOPHAGE) 1000 MG tablet TAKE 1 TABLET (1,000 MG TOTAL) BY MOUTH 2 (TWO) TIMES DAILY WITH A MEAL. 06/19/21   Philemon Kingdom, MD  NOVOLOG 100 UNIT/ML injection USE UP TO 120 UNITS IN INSULIN PUMP PER DAY 07/03/21   Philemon Kingdom, MD  venlafaxine XR (EFFEXOR-XR) 150 MG 24 hr capsule TAKE 2 CAPSULES BY MOUTH EVERY DAY 06/05/21   McGowen, Adrian Blackwater, MD      Allergies    Patient has no known allergies.    Review of Systems   Review of Systems  Respiratory:  Positive for shortness of breath.   Gastrointestinal:  Positive for abdominal pain.  Musculoskeletal:  Positive for arthralgias.  All other systems reviewed and are negative.  Physical Exam Updated Vital Signs BP 138/72    Pulse (!) 114    Temp (!) 97.5 F (36.4 C)    Resp 19    Ht 5\' 7"  (1.702 m)    Wt 104.3 kg    SpO2 95%    BMI 36.02 kg/m  Physical Exam Vitals and nursing note reviewed.  Constitutional:      General: She is not in acute distress.    Appearance: Normal appearance. She is  obese.  HENT:     Head: Normocephalic and atraumatic.  Eyes:     General:        Right eye: No discharge.        Left eye: No discharge.  Cardiovascular:     Rate and Rhythm: Regular rhythm. Tachycardia present.     Heart sounds: No murmur heard.   No friction rub. No gallop.  Pulmonary:     Effort: Pulmonary effort is normal.     Breath sounds: Normal breath sounds.     Comments: Patient has clear breath sounds on my exam, with some borderline tachypnea, overall shallow respirations.  She has no accessory breath sounds including wheezing, rhonchi, rales. Abdominal:     General: Bowel sounds are normal.     Palpations: Abdomen is soft.     Comments: Patient has diffuse generalized tenderness to palpation of the abdomen, no rebound,  rigidity, guarding.  Skin:    General: Skin is warm and dry.     Capillary Refill: Capillary refill takes less than 2 seconds.  Neurological:     Mental Status: She is alert and oriented to person, place, and time.     Comments: Not show any signs of confusion, she is alert and oriented for the duration of her exam.  She has no unilateral facial droop.  She has intact strength of bilateral upper and lower extremities although she endorses some cramping.  Psychiatric:        Mood and Affect: Mood normal.        Behavior: Behavior normal.    ED Results / Procedures / Treatments   Labs (all labs ordered are listed, but only abnormal results are displayed) Labs Reviewed  BASIC METABOLIC PANEL - Abnormal; Notable for the following components:      Result Value   Sodium 129 (*)    Potassium 6.0 (*)    Chloride 92 (*)    Glucose, Bld 785 (*)    BUN 23 (*)    Creatinine, Ser 1.33 (*)    GFR, Estimated 47 (*)    All other components within normal limits  CBC - Abnormal; Notable for the following components:   WBC 16.4 (*)    All other components within normal limits  URINALYSIS, ROUTINE W REFLEX MICROSCOPIC - Abnormal; Notable for the following components:   Specific Gravity, Urine 1.031 (*)    Glucose, UA >1,000 (*)    Ketones, ur 15 (*)    Leukocytes,Ua MODERATE (*)    Bacteria, UA MANY (*)    All other components within normal limits  CBG MONITORING, ED - Abnormal; Notable for the following components:   Glucose-Capillary >600 (*)    All other components within normal limits  I-STAT VENOUS BLOOD GAS, ED - Abnormal; Notable for the following components:   pCO2, Ven 42.3 (*)    Sodium 128 (*)    Potassium 7.1 (*)    Calcium, Ion 1.09 (*)    All other components within normal limits  PREGNANCY, URINE  BLOOD GAS, VENOUS  CBG MONITORING, ED    EKG EKG Interpretation  Date/Time:  Wednesday July 19 2021 21:29:52 EST Ventricular Rate:  116 PR Interval:  162 QRS  Duration: 101 QT Interval:  328 QTC Calculation: 456 R Axis:   79 Text Interpretation: Sinus tachycardia Low voltage, precordial leads No significant change since last tracing Confirmed by Calvert Cantor [id number]) on 07/19/2021 10:14:45 PM  Radiology No results found.  Procedures Procedures  Medications Ordered in ED Medications  sodium chloride 0.9 % bolus 1,000 mL (1,000 mLs Intravenous New Bag/Given 07/19/21 2054)  insulin aspart (novoLOG) injection 10 Units (10 Units Intravenous Given 07/19/21 2120)  sodium chloride 0.9 % bolus 1,000 mL (1,000 mLs Intravenous New Bag/Given 07/19/21 2124)  ondansetron (ZOFRAN) injection 4 mg (4 mg Intravenous Given 07/19/21 2132)    ED Course/ Medical Decision Making/ A&P                           Medical Decision Making  I discussed this case with my attending physician who cosigned this note including patient's presenting symptoms, physical exam, and planned diagnostics and interventions. Attending physician stated agreement with plan or made changes to plan which were implemented.   Attending physician assessed patient at bedside.  Patient with known type 1 diabetes, labile blood glucose control presents with elevated blood sugar reading from home, with additional symptoms of epigastric abdominal pain, muscle cramping, shortness of breath.  Temperature presented serious problem that requires emergent care.  My differential with known hyperglycemia, and additional symptoms includes DKA, HHS, hyperglycemia among other things.  I also have some clinical suspicion that she may have some secondary disease process manifestation that is causing labile blood sugars at this time.  I independently ordered and reviewed labs which showed a BMP significant for pseudohyponatremia, she has a sodium of 129, with a glucose of 785, which gives a corrected sodium of 140-145 depending on calculation formula.  Additionally she has a potassium of 6.0 on BMP, and 7.1, I think  that there is some peripheral mobilization of potassium secondary to her hyperglycemia, and we will wait to overcorrect until she has had fluid bolus, and insulin administration.  Additionally she does have what appears to be an acute kidney injury with creatinine of 1.33, slightly elevated BUN of 23.  Will reevaluate after fluid ministration.  In context of her hyper kalemia I have also ordered an EKG which shows sinus tachycardia.  Maintaining reviewed and independently agrees with this interpretation.  Patient's urinalysis is significant for elevated specific gravity, elevated urine glucose, ketones, as well as moderate leukocytes and bacteria.  Possible that she has a secondary bacterial infection, although it is also clear that her urine shows signs of dehydration.  She denies any symptoms of dysuria, frequency at this time.  Patient does not have an anion gap, and she is not acidotic on her venous blood gas.  In this context we will begin with fluid rehydration, I have ordered 2 L bolus of saline, will administer 10 units IV NovoLog, as well as Zofran for her nausea.  10:27 PM Care of Millicent Blazejewski Donnan transferred to Dr. Karle Starch at the end of my shift as the patient will require reassessment once labs/imaging have resulted. Patient presentation, ED course, and plan of care discussed with review of all pertinent labs and imaging. Please see his/her note for further details regarding further ED course and disposition. Plan at time of handoff is continue checking q. hourly CBGs, fluid administration, and insulin until she is at a healthier level with her hyperglycemia, and then re-evaluate her clinical condition.. This may be altered or completely changed at the discretion of the oncoming team pending results of further workup.  Final Clinical Impression(s) / ED Diagnoses Final diagnoses:  None    Rx / DC Orders ED Discharge Orders     None  Dorien Chihuahua 07/19/21 2227     Truddie Hidden, MD 07/19/21 [phone]

## 2021-07-19 NOTE — ED Provider Notes (Signed)
Care assumed from Dr. Karle Starch, patient with hyperglycemia and hyperkalemia has received IV fluids and insulin pending repeat metabolic panel.  Repeat metabolic panel shows glucose down to 562, anion gap stable at 14.  We will give additional insulin to try to get glucose to an acceptable level for discharge.  Potassium has come down to 3.8.  Glucose has decreased to 447, she is given additional insulin.  Glucose has decreased to 360.  This is felt to be a safe level for patient to be discharged.  She is to follow-up with her PCP and her endocrinologist.   Delora Fuel, MD 20/25/42 [phone]

## 2021-07-19 NOTE — ED Provider Notes (Signed)
Patient seen and examined here with hyperglycemia after an episode of hypoglycemia at home yesterday after which she was given a bag of dextrose by EMS now with hyperglycemia and moderate hyperkalemia without EKG changes or DKA. Will recheck BMP after IVF and insulin. Care of the patient signed out to Dr. Roxanne Mins at the change of shift.    Truddie Hidden, MD 07/19/21 2308

## 2021-07-19 NOTE — ED Notes (Signed)
RT obtained VBG from pts IV w/the following results. MD notified of results. RT will continue to monitor.    Latest Reference Range & Units 07/19/21 21:02  Sample type  VENOUS  pH, Ven 7.250 - 7.430  7.391  pCO2, Ven 44.0 - 60.0 mmHg 42.3 (L)  pO2, Ven 32.0 - 45.0 mmHg 35.0  TCO2 22 - 32 mmol/L 27  Acid-Base Excess 0.0 - 2.0 mmol/L 0.0  Bicarbonate 20.0 - 28.0 mmol/L 25.7  O2 Saturation % 66.0  Collection site  IV start

## 2021-07-19 NOTE — ED Triage Notes (Signed)
Pt states she had hypoglycemia yesterday and had to have EMS get her blood sugar back up. Today she reports her blood sugar is high. She reports associated muscle cramps, HA, nausea.

## 2021-07-20 LAB — CBG MONITORING, ED
Glucose-Capillary: 360 mg/dL — ABNORMAL HIGH (ref 70–99)
Glucose-Capillary: 447 mg/dL — ABNORMAL HIGH (ref 70–99)

## 2021-07-20 MED ORDER — INSULIN ASPART 100 UNIT/ML IV SOLN
10.0000 [IU] | Freq: Once | INTRAVENOUS | Status: AC
  Administered 2021-07-20: 10 [IU] via INTRAVENOUS

## 2021-07-25 DIAGNOSIS — E1065 Type 1 diabetes mellitus with hyperglycemia: Secondary | ICD-10-CM | POA: Diagnosis not present

## 2021-08-10 ENCOUNTER — Encounter: Payer: Self-pay | Admitting: Internal Medicine

## 2021-08-10 ENCOUNTER — Other Ambulatory Visit: Payer: Self-pay

## 2021-08-10 ENCOUNTER — Ambulatory Visit (INDEPENDENT_AMBULATORY_CARE_PROVIDER_SITE_OTHER): Payer: HMO | Admitting: Internal Medicine

## 2021-08-10 VITALS — BP 110/78 | HR 101 | Ht 67.0 in | Wt 238.4 lb

## 2021-08-10 DIAGNOSIS — E782 Mixed hyperlipidemia: Secondary | ICD-10-CM | POA: Diagnosis not present

## 2021-08-10 DIAGNOSIS — E1065 Type 1 diabetes mellitus with hyperglycemia: Secondary | ICD-10-CM | POA: Diagnosis not present

## 2021-08-10 LAB — POCT GLYCOSYLATED HEMOGLOBIN (HGB A1C): Hemoglobin A1C: 8.9 % — AB (ref 4.0–5.6)

## 2021-08-10 NOTE — Patient Instructions (Addendum)
Please use the following pump settings: - basal rates:  12 am: 1.8 >> 1.4 4 am: 2.2 9 am: 1.8 12 pm: 1.8 9 pm: 1.8 >> 1.4 - ICR:  12 am: 2.4 5 am: 2.4  4:30 pm: 2.8 >> 4 - target: 100-130 - ISF:  12 am: 60 5 am: 50 4:30 pm: 65 >> 80  Please do the following approximately 15 minutes before every meal: - Enter carbs (C) - Enter sugars (S) - Start insulin bolus (I)  If you forget to bolus before the meal: - if you bolus within 1 hour from starting the meal, enter 50% of the carbs  - if you bolus > 1h from starting the meal, only do a correction  Check with your insurance if the following closed loop systems are covered: - t:slim x2 pump + Dexcom G6 CGM - Omnipod 5 + Dexcom G6 CGM - Medtronic 770 G + Medtronic Guardian CGM  Please schedule a new appt with Vaughan Basta in few days. [phone].  Please return in 3-4 months.
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
## 2021-08-14 ENCOUNTER — Telehealth: Payer: Self-pay | Admitting: Nutrition

## 2021-08-14 NOTE — Telephone Encounter (Signed)
Pt is calling in to speak w/Linda concerning her new insulin pump.

## 2021-08-15 NOTE — Telephone Encounter (Signed)
Pt. Is scheduled to see me tomorrow.  She has a new pump to set up.  I will review with her what I can and see what is causing all of the lows.

## 2021-08-15 NOTE — Telephone Encounter (Signed)
Wonderful! Thank you, Vaughan Basta!!!

## 2021-08-16 ENCOUNTER — Other Ambulatory Visit: Payer: Self-pay

## 2021-08-16 ENCOUNTER — Encounter: Payer: HMO | Attending: Family Medicine | Admitting: Nutrition

## 2021-08-16 DIAGNOSIS — E119 Type 2 diabetes mellitus without complications: Secondary | ICD-10-CM | POA: Insufficient documentation

## 2021-08-16 DIAGNOSIS — E1065 Type 1 diabetes mellitus with hyperglycemia: Secondary | ICD-10-CM

## 2021-08-16 DIAGNOSIS — Z713 Dietary counseling and surveillance: Secondary | ICD-10-CM | POA: Diagnosis not present

## 2021-08-16 NOTE — Progress Notes (Signed)
Patient is wearing a Medtronic 670G with Farber sensor.  Having many lows with assistance from ambulance X2.  She is here today because she is needing help with new pump and transferring the settings from her old pump.   Discussed the fact that she is needing a sensor that transmits the reading to the pump to allow the pump to stop the flow of insulin when she gets low.  She has agreed to allow me to get a cost for the medtronic sensors.  She is paying nothing for the Paragon sensors because they are billed under Medicare part D.  Arnetha Courser called and message left to see if she can check the cost for me for these sensors for her.  Pt. Is willing to get Tandem pump if Dexcom sensors will cost less.   We discussed carb counting, and she appears to be counting carbs correctly with her meals. While transferring the settings to her new pump, it was seen that her I/C ratio was not as per dr. Arman Filter last office not for time of 4PM to MN.  Changes were made and to new pump settings to accurately reflex last office note. New [employer] done,and shown to Dr. Cruzita Lederer and basal rate changed at 4AM from 2.2 to 2.0,  Reviewed low blood sugar treatments and she is using glucose tablets 4, or juice 1/2cup.  Waiting 15 min., retesting blood sugar level, not cgm readings.  She had no final questions for me.

## 2021-08-18 NOTE — Patient Instructions (Signed)
Call office if having low blood sugars!Marland Kitchen

## 2021-08-21 DIAGNOSIS — H43813 Vitreous degeneration, bilateral: Secondary | ICD-10-CM | POA: Diagnosis not present

## 2021-08-21 DIAGNOSIS — H2513 Age-related nuclear cataract, bilateral: Secondary | ICD-10-CM | POA: Diagnosis not present

## 2021-08-21 DIAGNOSIS — E119 Type 2 diabetes mellitus without complications: Secondary | ICD-10-CM | POA: Diagnosis not present

## 2021-08-21 LAB — HM DIABETES EYE EXAM

## 2021-09-13 ENCOUNTER — Other Ambulatory Visit: Payer: Self-pay | Admitting: Family Medicine

## 2021-09-14 NOTE — Telephone Encounter (Signed)
Requesting: Clonazepam ?Contract: 05/09/20 ?UDS: 05/18/21 ?Last Visit: 05/18/21 ?Next Visit: 11/15/21 ?Last Refill: 12/13/20(90,5) ? ?Please Advise. Med pending ?

## 2021-09-24 ENCOUNTER — Other Ambulatory Visit: Payer: Self-pay | Admitting: Family Medicine

## 2021-11-15 ENCOUNTER — Ambulatory Visit: Payer: HMO | Admitting: Family Medicine

## 2021-11-26 ENCOUNTER — Other Ambulatory Visit: Payer: Self-pay | Admitting: Family Medicine

## 2021-11-30 ENCOUNTER — Ambulatory Visit (INDEPENDENT_AMBULATORY_CARE_PROVIDER_SITE_OTHER): Payer: HMO | Admitting: Family Medicine

## 2021-11-30 ENCOUNTER — Encounter: Payer: Self-pay | Admitting: Family Medicine

## 2021-11-30 VITALS — BP 110/71 | HR 103 | Temp 97.9°F | Ht 67.0 in | Wt 231.0 lb

## 2021-11-30 DIAGNOSIS — Z8659 Personal history of other mental and behavioral disorders: Secondary | ICD-10-CM | POA: Diagnosis not present

## 2021-11-30 DIAGNOSIS — Z23 Encounter for immunization: Secondary | ICD-10-CM

## 2021-11-30 DIAGNOSIS — F411 Generalized anxiety disorder: Secondary | ICD-10-CM | POA: Diagnosis not present
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
## 2021-12-08 ENCOUNTER — Ambulatory Visit: Payer: HMO | Admitting: Internal Medicine

## 2021-12-10 ENCOUNTER — Other Ambulatory Visit: Payer: Self-pay | Admitting: Family Medicine

## 2021-12-18 ENCOUNTER — Other Ambulatory Visit: Payer: Self-pay | Admitting: Family Medicine

## 2021-12-19 NOTE — Telephone Encounter (Signed)
Requesting: Clonazepam Contract: 05/09/20 UDS: 05/18/21 Last Visit: 11/30/21 [facility] Next Visit: 06/06/22 CPE Last Refill: 09/14/21 (90,1) TID  Please Advise. Med pending

## 2022-02-19 ENCOUNTER — Other Ambulatory Visit: Payer: Self-pay | Admitting: Internal Medicine

## 2022-02-21 NOTE — Patient Instructions (Signed)
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
## 2022-02-24 ENCOUNTER — Ambulatory Visit (INDEPENDENT_AMBULATORY_CARE_PROVIDER_SITE_OTHER): Payer: HMO

## 2022-02-24 DIAGNOSIS — Z0000 Encounter for general adult medical examination without abnormal findings: Secondary | ICD-10-CM

## 2022-03-05 ENCOUNTER — Other Ambulatory Visit: Payer: Self-pay

## 2022-03-05 NOTE — Progress Notes (Unsigned)
Requesting: clonazepam Contract: 05/09/20 UDS: 05/18/21 Last Visit:11/30/21 Next Visit: 06/06/22 Last Refill: 12/19/21(90,1)  Please Advise. Medication pending

## 2022-03-06 MED ORDER — CLONAZEPAM 1 MG PO TABS: ORAL_TABLET | ORAL | 5 refills | Status: DC

## 2022-03-06 NOTE — Progress Notes (Signed)
Clonazepam prescription sent.

## 2022-03-07 NOTE — Progress Notes (Signed)
Patient's husband advised refill sent.

## 2022-04-02 ENCOUNTER — Other Ambulatory Visit: Payer: Self-pay | Admitting: Family Medicine

## 2022-04-03 NOTE — Telephone Encounter (Signed)
Requesting: clonazepam Contract:05/09/20 UDS:05/18/21 Last Visit: 11/30/21 Next Visit: 06/06/22 Last Refill: 03/06/22(90,5)  Please decline refill

## 2022-04-05 ENCOUNTER — Ambulatory Visit: Payer: HMO | Admitting: Internal Medicine

## 2022-04-05 ENCOUNTER — Encounter: Payer: Self-pay | Admitting: Internal Medicine

## 2022-04-05 VITALS — BP 132/82 | HR 89 | Ht 67.0 in | Wt 231.4 lb

## 2022-04-05 DIAGNOSIS — E782 Mixed hyperlipidemia: Secondary | ICD-10-CM

## 2022-04-05 DIAGNOSIS — E1065 Type 1 diabetes mellitus with hyperglycemia: Secondary | ICD-10-CM | POA: Diagnosis not present

## 2022-04-05 LAB — POCT GLYCOSYLATED HEMOGLOBIN (HGB A1C): Hemoglobin A1C: 10.1 % — AB (ref 4.0–5.6)

## 2022-04-05 NOTE — Patient Instructions (Addendum)
Please use the following pump settings: - basal rates:  12 am: 1.4 4 am: 2.0 9 am: 1.8 12 pm: 1.8 9 pm: 1.4 - ICR:  12 am: 2.4 5 am: 2.4  4:30 pm: 4 >> 2.8 - target: 100-130 - ISF:  12 am: 60 5 am: 50 4:30 pm: 50 >> 80    9 pm: 80  Please do the following approximately 15 minutes before every meal: - Enter carbs (C) - Enter sugars (S) - Start insulin bolus (I)  You need to enter some carbs for the meat or other low carb meals.  If you forget to bolus before the meal: - if you bolus within 1 hour from starting the meal, enter 50% of the carbs  - if you bolus > 1h from starting the meal, only do a correction  Please return in 3-4 months.
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
## 2022-04-06 LAB — LIPID PANEL
Cholesterol: 103 mg/dL (ref 0–200)
HDL: 49.2 mg/dL (ref >39.00)
LDL Cholesterol: 37 mg/dL (ref 0–99)
NonHDL: 53.86
Total CHOL/HDL Ratio: 2
Triglycerides: 84 mg/dL (ref 0.0–149.0)
VLDL: 16.8 mg/dL (ref 0.0–40.0)

## 2022-04-06 LAB — COMPLETE METABOLIC PANEL WITH GFR
AG Ratio: 1.7 (calc) (ref 1.0–2.5)
ALT: 85 U/L — ABNORMAL HIGH (ref 6–29)
AST: 52 U/L — ABNORMAL HIGH (ref 10–35)
Albumin: 4.2 g/dL (ref 3.6–5.1)
Alkaline phosphatase (APISO): 150 U/L (ref 37–153)
BUN: 18 mg/dL (ref 7–25)
CO2: 26 mmol/L (ref 20–32)
Calcium: 9.1 mg/dL (ref 8.6–10.4)
Chloride: 104 mmol/L (ref 98–110)
Creat: 0.91 mg/dL (ref 0.50–1.03)
Globulin: 2.5 g/dL (calc) (ref 1.9–3.7)
Glucose, Bld: 105 mg/dL — ABNORMAL HIGH (ref 65–99)
Potassium: 4.8 mmol/L (ref 3.5–5.3)
Sodium: 140 mmol/L (ref 135–146)
Total Bilirubin: 1.2 mg/dL (ref 0.2–1.2)
Total Protein: 6.7 g/dL (ref 6.1–8.1)
eGFR: 74 mL/min/{1.73_m2} (ref >=60)

## 2022-04-06 LAB — TSH: TSH: 1.09 u[IU]/mL (ref 0.35–5.50)

## 2022-04-06 LAB — MICROALBUMIN / CREATININE URINE RATIO
Creatinine,U: 247.4 mg/dL
Microalb Creat Ratio: 0.8 mg/g (ref 0.0–30.0)
Microalb, Ur: 1.9 mg/dL (ref 0.0–1.9)

## 2022-04-27 ENCOUNTER — Telehealth: Payer: Self-pay | Admitting: Family Medicine

## 2022-04-27 ENCOUNTER — Other Ambulatory Visit: Payer: Self-pay | Admitting: Family Medicine

## 2022-04-27 DIAGNOSIS — Z1231 Encounter for screening mammogram for malignant neoplasm of breast: Secondary | ICD-10-CM

## 2022-04-27 NOTE — Telephone Encounter (Signed)
Kristie [name] is asking for a referral to the breast center for a Bone and density scan.

## 2022-04-27 NOTE — Telephone Encounter (Signed)
Please confirm if bone density needed.

## 2022-05-26 ENCOUNTER — Other Ambulatory Visit: Payer: Self-pay | Admitting: Internal Medicine

## 2022-05-31 NOTE — Patient Instructions (Incomplete)

## 2022-06-06 ENCOUNTER — Encounter: Payer: Self-pay | Admitting: Family Medicine

## 2022-06-06 ENCOUNTER — Telehealth (INDEPENDENT_AMBULATORY_CARE_PROVIDER_SITE_OTHER): Payer: HMO | Admitting: Family Medicine

## 2022-06-06 ENCOUNTER — Encounter: Payer: HMO | Admitting: Family Medicine

## 2022-06-06 VITALS — Temp 101.6°F

## 2022-06-06 DIAGNOSIS — R509 Fever, unspecified: Secondary | ICD-10-CM | POA: Diagnosis not present

## 2022-06-06 DIAGNOSIS — J40 Bronchitis, not specified as acute or chronic: Secondary | ICD-10-CM | POA: Diagnosis not present

## 2022-06-06 DIAGNOSIS — R051 Acute cough: Secondary | ICD-10-CM | POA: Diagnosis not present

## 2022-06-06 MED ORDER — BENZONATATE 200 MG PO CAPS: 200.0000 mg | ORAL_CAPSULE | Freq: Two times a day (BID) | ORAL | 0 refills | Status: DC | PRN

## 2022-06-06 MED ORDER — MOLNUPIRAVIR EUA 200MG CAPSULE: 4.0000 | ORAL_CAPSULE | Freq: Two times a day (BID) | ORAL | 0 refills | Status: AC

## 2022-06-06 MED ORDER — AMOXICILLIN-POT CLAVULANATE 875-125 MG PO TABS: 1.0000 | ORAL_TABLET | Freq: Two times a day (BID) | ORAL | 0 refills | Status: DC
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
## 2022-06-06 NOTE — Patient Instructions (Signed)
No follow-ups on file.        Great to see you today.  I have refilled the medication(s) we provide.   If labs were collected, we will inform you of lab results once received either by echart message or telephone call.   - echart message- for normal results that have been seen by the patient already.   - telephone call: abnormal results or if patient has not viewed results in their echart.  

## 2022-06-11 ENCOUNTER — Ambulatory Visit: Payer: HMO

## 2022-06-15 ENCOUNTER — Other Ambulatory Visit: Payer: Self-pay | Admitting: Internal Medicine

## 2022-06-15 ENCOUNTER — Other Ambulatory Visit: Payer: Self-pay | Admitting: Family Medicine

## 2022-06-17 ENCOUNTER — Other Ambulatory Visit: Payer: Self-pay | Admitting: Family Medicine

## 2022-06-22 NOTE — Patient Instructions (Signed)

## 2022-06-25 ENCOUNTER — Ambulatory Visit (INDEPENDENT_AMBULATORY_CARE_PROVIDER_SITE_OTHER): Payer: HMO | Admitting: Family Medicine

## 2022-06-25 ENCOUNTER — Encounter: Payer: Self-pay | Admitting: Family Medicine

## 2022-06-25 VITALS — BP 109/72 | HR 100 | Temp 97.5°F | Ht 67.32 in | Wt 225.8 lb

## 2022-06-25 DIAGNOSIS — F411 Generalized anxiety disorder: Secondary | ICD-10-CM | POA: Diagnosis not present

## 2022-06-25 DIAGNOSIS — Z124 Encounter for screening for malignant neoplasm of cervix: Secondary | ICD-10-CM

## 2022-06-25 DIAGNOSIS — Z0000 Encounter for general adult medical examination without abnormal findings: Secondary | ICD-10-CM

## 2022-06-25 DIAGNOSIS — Z975 Presence of (intrauterine) contraceptive device: Secondary | ICD-10-CM

## 2022-06-25 DIAGNOSIS — E7800 Pure hypercholesterolemia, unspecified: Secondary | ICD-10-CM

## 2022-06-25 DIAGNOSIS — E1065 Type 1 diabetes mellitus with hyperglycemia: Secondary | ICD-10-CM | POA: Diagnosis not present

## 2022-06-25 LAB — COMPREHENSIVE METABOLIC PANEL
ALT: 19 U/L (ref 0–35)
AST: 17 U/L (ref 0–37)
Albumin: 3.8 g/dL (ref 3.5–5.2)
Alkaline Phosphatase: 126 U/L — ABNORMAL HIGH (ref 39–117)
BUN: 18 mg/dL (ref 6–23)
CO2: 28 mEq/L (ref 19–32)
Calcium: 9.3 mg/dL (ref 8.4–10.5)
Chloride: 103 mEq/L (ref 96–112)
Creatinine, Ser: 0.99 mg/dL (ref 0.40–1.20)
GFR: 63.73 mL/min (ref >60.00)
Glucose, Bld: 157 mg/dL — ABNORMAL HIGH (ref 70–99)
Potassium: 4.3 mEq/L (ref 3.5–5.1)
Sodium: 141 mEq/L (ref 135–145)
Total Bilirubin: 1.1 mg/dL (ref 0.2–1.2)
Total Protein: 6.4 g/dL (ref 6.0–8.3)

## 2022-06-25 LAB — CBC
HCT: 41.8 % (ref 36.0–46.0)
Hemoglobin: 13.9 g/dL (ref 12.0–15.0)
MCHC: 33.2 g/dL (ref 30.0–36.0)
MCV: 87.9 fl (ref 78.0–100.0)
Platelets: 325 10*3/uL (ref 150.0–400.0)
RBC: 4.76 Mil/uL (ref 3.87–5.11)
RDW: 14.7 % (ref 11.5–15.5)
WBC: 5.7 10*3/uL (ref 4.0–10.5)

## 2022-06-25 LAB — LIPID PANEL
Cholesterol: 231 mg/dL — ABNORMAL HIGH (ref 0–200)
HDL: 51.7 mg/dL (ref >39.00)
LDL Cholesterol: 148 mg/dL — ABNORMAL HIGH (ref 0–99)
NonHDL: 179.4
Total CHOL/HDL Ratio: 4
Triglycerides: 156 mg/dL — ABNORMAL HIGH (ref 0.0–149.0)
VLDL: 31.2 mg/dL (ref 0.0–40.0)

## 2022-06-25 LAB — TSH: TSH: 1.03 u[IU]/mL (ref 0.35–5.50)

## 2022-06-25 MED ORDER — EZETIMIBE 10 MG PO TABS: 10.0000 mg | ORAL_TABLET | Freq: Every day | ORAL | 1 refills | Status: DC

## 2022-06-25 MED ORDER — VENLAFAXINE HCL ER 150 MG PO CP24: 300.0000 mg | ORAL_CAPSULE | Freq: Every day | ORAL | 1 refills | Status: DC

## 2022-06-25 MED ORDER — ATORVASTATIN CALCIUM 80 MG PO TABS: 80.0000 mg | ORAL_TABLET | Freq: Every day | ORAL | 1 refills | Status: DC
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
## 2022-07-20 ENCOUNTER — Other Ambulatory Visit: Payer: Self-pay

## 2022-07-20 ENCOUNTER — Encounter (HOSPITAL_COMMUNITY): Payer: Self-pay

## 2022-07-20 ENCOUNTER — Inpatient Hospital Stay (HOSPITAL_BASED_OUTPATIENT_CLINIC_OR_DEPARTMENT_OTHER)
Admission: EM | Admit: 2022-07-20 | Discharge: 2022-07-23 | DRG: 919 | Disposition: A | Discharge status: Home / Self Care | Payer: PPO | Attending: Internal Medicine | Admitting: Internal Medicine

## 2022-07-20 ENCOUNTER — Encounter (HOSPITAL_BASED_OUTPATIENT_CLINIC_OR_DEPARTMENT_OTHER): Payer: Self-pay | Admitting: Emergency Medicine

## 2022-07-20 ENCOUNTER — Emergency Department (HOSPITAL_BASED_OUTPATIENT_CLINIC_OR_DEPARTMENT_OTHER): Payer: PPO

## 2022-07-20 DIAGNOSIS — T85614A Breakdown (mechanical) of insulin pump, initial encounter: Principal | ICD-10-CM | POA: Diagnosis present

## 2022-07-20 DIAGNOSIS — R000 Tachycardia, unspecified: Secondary | ICD-10-CM | POA: Diagnosis present

## 2022-07-20 DIAGNOSIS — Z833 Family history of diabetes mellitus: Secondary | ICD-10-CM

## 2022-07-20 DIAGNOSIS — Z811 Family history of alcohol abuse and dependence: Secondary | ICD-10-CM

## 2022-07-20 DIAGNOSIS — N179 Acute kidney failure, unspecified: Secondary | ICD-10-CM | POA: Diagnosis present

## 2022-07-20 DIAGNOSIS — E7800 Pure hypercholesterolemia, unspecified: Secondary | ICD-10-CM | POA: Diagnosis not present

## 2022-07-20 DIAGNOSIS — D75839 Thrombocytosis, unspecified: Secondary | ICD-10-CM | POA: Diagnosis present

## 2022-07-20 DIAGNOSIS — Z803 Family history of malignant neoplasm of breast: Secondary | ICD-10-CM

## 2022-07-20 DIAGNOSIS — Z83719 Family history of colon polyps, unspecified: Secondary | ICD-10-CM | POA: Diagnosis not present

## 2022-07-20 DIAGNOSIS — Z818 Family history of other mental and behavioral disorders: Secondary | ICD-10-CM | POA: Diagnosis not present

## 2022-07-20 DIAGNOSIS — Z794 Long term (current) use of insulin: Secondary | ICD-10-CM

## 2022-07-20 DIAGNOSIS — K429 Umbilical hernia without obstruction or gangrene: Secondary | ICD-10-CM | POA: Diagnosis not present

## 2022-07-20 DIAGNOSIS — F419 Anxiety disorder, unspecified: Secondary | ICD-10-CM | POA: Diagnosis present

## 2022-07-20 DIAGNOSIS — Z1152 Encounter for screening for COVID-19: Secondary | ICD-10-CM

## 2022-07-20 DIAGNOSIS — F32A Depression, unspecified: Secondary | ICD-10-CM | POA: Diagnosis not present

## 2022-07-20 DIAGNOSIS — E860 Dehydration: Secondary | ICD-10-CM | POA: Diagnosis not present

## 2022-07-20 DIAGNOSIS — E872 Acidosis, unspecified: Secondary | ICD-10-CM | POA: Diagnosis present

## 2022-07-20 DIAGNOSIS — N281 Cyst of kidney, acquired: Secondary | ICD-10-CM | POA: Diagnosis not present

## 2022-07-20 DIAGNOSIS — Z6835 Body mass index (BMI) 35.0-35.9, adult: Secondary | ICD-10-CM

## 2022-07-20 DIAGNOSIS — Z86010 Personal history of colonic polyps: Secondary | ICD-10-CM | POA: Diagnosis not present

## 2022-07-20 DIAGNOSIS — E669 Obesity, unspecified: Secondary | ICD-10-CM | POA: Diagnosis not present

## 2022-07-20 DIAGNOSIS — Z22322 Carrier or suspected carrier of Methicillin resistant Staphylococcus aureus: Secondary | ICD-10-CM | POA: Diagnosis not present

## 2022-07-20 DIAGNOSIS — E1010 Type 1 diabetes mellitus with ketoacidosis without coma: Secondary | ICD-10-CM | POA: Diagnosis present

## 2022-07-20 DIAGNOSIS — Y742 Prosthetic and other implants, materials and accessory general hospital and personal-use devices associated with adverse incidents: Secondary | ICD-10-CM | POA: Diagnosis present

## 2022-07-20 DIAGNOSIS — D72829 Elevated white blood cell count, unspecified: Secondary | ICD-10-CM | POA: Diagnosis not present

## 2022-07-20 DIAGNOSIS — Z79899 Other long term (current) drug therapy: Secondary | ICD-10-CM

## 2022-07-20 LAB — CBG MONITORING, ED
Glucose-Capillary: >600 mg/dL (ref 70–99)
Glucose-Capillary: >600 mg/dL (ref 70–99)
Glucose-Capillary: >600 mg/dL (ref 70–99)
Glucose-Capillary: >600 mg/dL (ref 70–99)
Glucose-Capillary: 586 mg/dL (ref 70–99)
Glucose-Capillary: >600 mg/dL (ref 70–99)
Glucose-Capillary: 597 mg/dL (ref 70–99)
Glucose-Capillary: 474 mg/dL — ABNORMAL HIGH (ref 70–99)
Glucose-Capillary: 425 mg/dL — ABNORMAL HIGH (ref 70–99)
Glucose-Capillary: 470 mg/dL — ABNORMAL HIGH (ref 70–99)

## 2022-07-20 LAB — I-STAT VENOUS BLOOD GAS, ED
Acid-base deficit: 23 mmol/L — ABNORMAL HIGH (ref 0.0–2.0)
Acid-base deficit: 20 mmol/L — ABNORMAL HIGH (ref 0.0–2.0)
Bicarbonate: 8.1 mmol/L — ABNORMAL LOW (ref 20.0–28.0)
Bicarbonate: 9.2 mmol/L — ABNORMAL LOW (ref 20.0–28.0)
Calcium, Ion: 1.19 mmol/L (ref 1.15–1.40)
Calcium, Ion: 1.27 mmol/L (ref 1.15–1.40)
HCT: 48 % — ABNORMAL HIGH (ref 36.0–46.0)
HCT: 40 % (ref 36.0–46.0)
Hemoglobin: 16.3 g/dL — ABNORMAL HIGH (ref 12.0–15.0)
Hemoglobin: 13.6 g/dL (ref 12.0–15.0)
O2 Saturation: 42 %
O2 Saturation: 65 %
Patient temperature: 98.6
Patient temperature: 98.6
Potassium: 6.2 mmol/L — ABNORMAL HIGH (ref 3.5–5.1)
Potassium: 4.6 mmol/L (ref 3.5–5.1)
Sodium: 130 mmol/L — ABNORMAL LOW (ref 135–145)
Sodium: 136 mmol/L (ref 135–145)
TCO2: 9 mmol/L — ABNORMAL LOW (ref 22–32)
TCO2: 10 mmol/L — ABNORMAL LOW (ref 22–32)
pCO2, Ven: 32.9 mmHg — ABNORMAL LOW (ref 44–60)
pCO2, Ven: 31.2 mmHg — ABNORMAL LOW (ref 44–60)
pH, Ven: 6.998 — CL (ref 7.25–7.43)
pH, Ven: 7.078 — CL (ref 7.25–7.43)
pO2, Ven: 35 mmHg (ref 32–45)
pO2, Ven: 46 mmHg — ABNORMAL HIGH (ref 32–45)

## 2022-07-20 LAB — BASIC METABOLIC PANEL
Anion gap: 31 — ABNORMAL HIGH (ref 5–15)
Anion gap: 14 (ref 5–15)
BUN: 48 mg/dL — ABNORMAL HIGH (ref 6–20)
BUN: 45 mg/dL — ABNORMAL HIGH (ref 6–20)
CO2: 9 mmol/L — ABNORMAL LOW (ref 22–32)
CO2: 17 mmol/L — ABNORMAL LOW (ref 22–32)
Calcium: 9.3 mg/dL (ref 8.9–10.3)
Calcium: 8.8 mg/dL — ABNORMAL LOW (ref 8.9–10.3)
Chloride: 99 mmol/L (ref 98–111)
Chloride: 104 mmol/L (ref 98–111)
Creatinine, Ser: 1.63 mg/dL — ABNORMAL HIGH (ref 0.44–1.00)
Creatinine, Ser: 1.6 mg/dL — ABNORMAL HIGH (ref 0.44–1.00)
GFR, Estimated: 37 mL/min — ABNORMAL LOW (ref >60)
GFR, Estimated: 38 mL/min — ABNORMAL LOW (ref >60)
Glucose, Bld: 689 mg/dL (ref 70–99)
Glucose, Bld: 327 mg/dL — ABNORMAL HIGH (ref 70–99)
Potassium: 4.5 mmol/L (ref 3.5–5.1)
Potassium: 4.2 mmol/L (ref 3.5–5.1)
Sodium: 139 mmol/L (ref 135–145)
Sodium: 135 mmol/L (ref 135–145)

## 2022-07-20 LAB — CBC
HCT: 48.7 % — ABNORMAL HIGH (ref 36.0–46.0)
Hemoglobin: 14.6 g/dL (ref 12.0–15.0)
MCH: 28.6 pg (ref 26.0–34.0)
MCHC: 30 g/dL (ref 30.0–36.0)
MCV: 95.5 fL (ref 80.0–100.0)
Platelets: 405 10*3/uL — ABNORMAL HIGH (ref 150–400)
RBC: 5.1 MIL/uL (ref 3.87–5.11)
RDW: 13.9 % (ref 11.5–15.5)
WBC: 29.1 10*3/uL — ABNORMAL HIGH (ref 4.0–10.5)
nRBC: 0 % (ref 0.0–0.2)

## 2022-07-20 LAB — COMPREHENSIVE METABOLIC PANEL
ALT: 33 U/L (ref 0–44)
AST: 16 U/L (ref 15–41)
Albumin: 4.8 g/dL (ref 3.5–5.0)
Alkaline Phosphatase: 184 U/L — ABNORMAL HIGH (ref 38–126)
Anion gap: 40 — ABNORMAL HIGH (ref 5–15)
BUN: 52 mg/dL — ABNORMAL HIGH (ref 6–20)
CO2: 7 mmol/L — ABNORMAL LOW (ref 22–32)
Calcium: 10.7 mg/dL — ABNORMAL HIGH (ref 8.9–10.3)
Chloride: 87 mmol/L — ABNORMAL LOW (ref 98–111)
Creatinine, Ser: 1.97 mg/dL — ABNORMAL HIGH (ref 0.44–1.00)
GFR, Estimated: 29 mL/min — ABNORMAL LOW (ref >60)
Glucose, Bld: 1091 mg/dL (ref 70–99)
Potassium: 5.9 mmol/L — ABNORMAL HIGH (ref 3.5–5.1)
Sodium: 134 mmol/L — ABNORMAL LOW (ref 135–145)
Total Bilirubin: 1 mg/dL (ref 0.3–1.2)
Total Protein: 7.8 g/dL (ref 6.5–8.1)

## 2022-07-20 LAB — GLUCOSE, CAPILLARY
Glucose-Capillary: 376 mg/dL — ABNORMAL HIGH (ref 70–99)
Glucose-Capillary: 364 mg/dL — ABNORMAL HIGH (ref 70–99)
Glucose-Capillary: 254 mg/dL — ABNORMAL HIGH (ref 70–99)
Glucose-Capillary: 241 mg/dL — ABNORMAL HIGH (ref 70–99)

## 2022-07-20 LAB — LIPASE, BLOOD: Lipase: 20 U/L (ref 11–51)

## 2022-07-20 LAB — TROPONIN I (HIGH SENSITIVITY)
Troponin I (High Sensitivity): 4 ng/L (ref <18)
Troponin I (High Sensitivity): 5 ng/L (ref <18)

## 2022-07-20 LAB — RESP PANEL BY RT-PCR (RSV, FLU A&B, COVID)  RVPGX2
Influenza A by PCR: NEGATIVE
Influenza B by PCR: NEGATIVE
Resp Syncytial Virus by PCR: NEGATIVE
SARS Coronavirus 2 by RT PCR: NEGATIVE

## 2022-07-20 LAB — MRSA NEXT GEN BY PCR, NASAL: MRSA by PCR Next Gen: NOT DETECTED

## 2022-07-20 LAB — PROCALCITONIN: Procalcitonin: 2.14 ng/mL

## 2022-07-20 LAB — BETA-HYDROXYBUTYRIC ACID
Beta-Hydroxybutyric Acid: >8 mmol/L — ABNORMAL HIGH (ref 0.05–0.27)
Beta-Hydroxybutyric Acid: 3.65 mmol/L — ABNORMAL HIGH (ref 0.05–0.27)

## 2022-07-20 LAB — LACTIC ACID, PLASMA
Lactic Acid, Venous: 4.9 mmol/L (ref 0.5–1.9)
Lactic Acid, Venous: 5 mmol/L (ref 0.5–1.9)
Lactic Acid, Venous: 3.6 mmol/L (ref 0.5–1.9)

## 2022-07-20 MED ORDER — DEXTROSE 50 % IV SOLN: 0.0000 mL | INTRAVENOUS | Status: DC | PRN

## 2022-07-20 MED ORDER — LACTATED RINGERS IV BOLUS
1000.0000 mL | Freq: Once | INTRAVENOUS | Status: AC
  Administered 2022-07-20: 1000 mL via INTRAVENOUS

## 2022-07-20 MED ORDER — ONDANSETRON HCL 4 MG/2ML IJ SOLN
4.0000 mg | Freq: Once | INTRAMUSCULAR | Status: AC
  Administered 2022-07-20: 4 mg via INTRAVENOUS
  Filled 2022-07-20: qty 2

## 2022-07-20 MED ORDER — LACTATED RINGERS IV SOLN: INTRAVENOUS | Status: DC

## 2022-07-20 MED ORDER — INSULIN REGULAR(HUMAN) IN NACL 100-0.9 UT/100ML-% IV SOLN
INTRAVENOUS | Status: DC
  Administered 2022-07-20: 7 [IU]/h via INTRAVENOUS
  Administered 2022-07-20: 8 [IU]/h via INTRAVENOUS
  Filled 2022-07-20 (×2): qty 100

## 2022-07-20 MED ORDER — DEXTROSE IN LACTATED RINGERS 5 % IV SOLN: INTRAVENOUS | Status: DC

## 2022-07-20 MED ORDER — CHLORHEXIDINE GLUCONATE CLOTH 2 % EX PADS
6.0000 | MEDICATED_PAD | Freq: Every day | CUTANEOUS | Status: DC
  Administered 2022-07-20: 6 via TOPICAL

## 2022-07-20 MED ORDER — ORAL CARE MOUTH RINSE: 15.0000 mL | OROMUCOSAL | Status: DC | PRN

## 2022-07-20 MED ORDER — ENOXAPARIN SODIUM 40 MG/0.4ML IJ SOSY
40.0000 mg | PREFILLED_SYRINGE | INTRAMUSCULAR | Status: DC
  Administered 2022-07-21 – 2022-07-22 (×2): 40 mg via SUBCUTANEOUS
  Filled 2022-07-20 (×2): qty 0.4

## 2022-07-20 NOTE — Assessment & Plan Note (Addendum)
WBC 29k today but not clear she has infection 1) CT AP neg -> GI symptoms presumably just secondary to DKA 2) No fever 3) no obvious source Check UA Check CXR Check procalcitonin Watch for development of fever Holding off on ordering ABx for the moment.

## 2022-07-20 NOTE — Assessment & Plan Note (Signed)
Likely pre-renal / ATN due to dehydration associated with DKA IVF as above UA Serial BMPs as above

## 2022-07-20 NOTE — ED Notes (Signed)
Called [facility] and spoke to [place]; informed that the patient Bed Assignment is READY
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
## 2022-07-20 NOTE — ED Provider Notes (Signed)
[facility] EMERGENCY DEPT Provider Note   CSN: [id number] Arrival date & time: 07/20/22  1109     History  Chief Complaint  Patient presents with   Emesis   Abdominal Pain   Dizziness    Kristie [name] is a 57 y.o. female with history significant for uncontrolled type 1 diabetes mellitus presents to the ED complaining of nausea, vomiting, and abdominal pain, fever, and dizziness that started last night.  Patient reports she has been having issues with her insulin pumps and has been having highs in the 400s and lows in the 50s of her blood sugar.  Patient states her insulin pumps have been recalled and she has been unable to get a properly working 1.  She states this has been going on for couple of months.  She reports she has been very thirsty but vomits anytime she attempts to drink something.  Denies chest pain, palpitations, shortness of breath, weakness, syncope, diarrhea.         Home Medications Prior to Admission medications   Medication Sig Start Date End Date Taking? Authorizing Provider  Alcohol Swabs (ALCOHOL PREP) 70 % PADS USE TO TEST BLOOD SUGAR FOUR TIMES DAILY AS DIRECTED 06/07/15   Philemon Kingdom, MD  atorvastatin (LIPITOR) 80 MG tablet Take 1 tablet (80 mg total) by mouth daily. 06/25/22   McGowen, Adrian Blackwater, MD  BERBERINE CHLORIDE PO Take 510 mg by mouth 3 (three) times daily.    [provider]  clonazePAM (KLONOPIN) 1 MG tablet TAKE 1 TABLET BY MOUTH THREE TIMES A DAY AS NEEDED FOR ANXIETY 04/04/22   McGowen, Adrian Blackwater, MD  Continuous Blood Gluc Sensor (FREESTYLE LIBRE 2 SENSOR) MISC USE AS DIRECTED EVERY 14 DAYS 02/19/22   Philemon Kingdom, MD  ezetimibe (ZETIA) 10 MG tablet Take 1 tablet (10 mg total) by mouth daily. 06/25/22   McGowen, Adrian Blackwater, MD  Glucagon 3 MG/DOSE POWD Place 3 mg into the nose once as needed for up to 1 dose. 04/10/21   Philemon Kingdom, MD  metFORMIN (GLUCOPHAGE) 1000 MG tablet TAKE 1 TABLET (1,000 MG TOTAL) BY MOUTH  TWICE A DAY WITH FOOD Patient not taking: Reported on 06/25/2022 06/15/22   Philemon Kingdom, MD  NOVOLOG 100 UNIT/ML injection USE UP TO 120 UNITS IN INSULIN PUMP PER DAY 05/28/22   Philemon Kingdom, MD  venlafaxine XR (EFFEXOR-XR) 150 MG 24 hr capsule Take 2 capsules (300 mg total) by mouth daily. 06/25/22   McGowen, Adrian Blackwater, MD      Allergies    Patient has no known allergies.    Review of Systems   Review of Systems  Constitutional:  Positive for chills and fever.  Respiratory:  Negative for shortness of breath.   Cardiovascular:  Negative for chest pain and palpitations.  Gastrointestinal:  Positive for abdominal pain, nausea and vomiting. Negative for constipation and diarrhea.  Neurological:  Positive for dizziness. Negative for syncope, weakness and light-headedness.    Physical Exam Updated Vital Signs BP (!) 119/59   Pulse (!) 128   Temp (!) 96.7 F (35.9 C) (Axillary)   Resp (!) 22   Ht '5\' 7"'$  (1.702 m)   Wt 102.1 kg   SpO2 100%   BMI 35.24 kg/m  Physical Exam Vitals and nursing note reviewed.  Constitutional:      General: She is not in acute distress.    Appearance: She is not ill-appearing.  HENT:     Mouth/Throat:     Mouth: Mucous  membranes are moist.     Pharynx: Oropharynx is clear.  Cardiovascular:     Rate and Rhythm: Normal rate and regular rhythm.     Pulses: Normal pulses.     Heart sounds: Normal heart sounds.  Pulmonary:     Effort: Pulmonary effort is normal. No respiratory distress.     Breath sounds: Normal breath sounds and air entry.  Abdominal:     General: Abdomen is flat. Bowel sounds are normal. There is no distension.     Palpations: Abdomen is soft.     Tenderness: There is generalized abdominal tenderness.  Skin:    General: Skin is warm and dry.     Capillary Refill: Capillary refill takes less than 2 seconds.  Neurological:     Mental Status: She is alert. Mental status is at baseline.  Psychiatric:        Mood and  Affect: Mood normal.        Behavior: Behavior normal.     ED Results / Procedures / Treatments   Labs (all labs ordered are listed, but only abnormal results are displayed) Labs Reviewed  COMPREHENSIVE METABOLIC PANEL - Abnormal; Notable for the following components:      Result Value   Sodium 134 (*)    Potassium 5.9 (*)    Chloride 87 (*)    CO2 7 (*)    Glucose, Bld 1,091 (*)    BUN 52 (*)    Creatinine, Ser 1.97 (*)    Calcium 10.7 (*)    Alkaline Phosphatase 184 (*)    GFR, Estimated 29 (*)    Anion gap 40 (*)    All other components within normal limits  CBC - Abnormal; Notable for the following components:   WBC 29.1 (*)    HCT 48.7 (*)    Platelets 405 (*)    All other components within normal limits  LACTIC ACID, PLASMA - Abnormal; Notable for the following components:   Lactic Acid, Venous 4.9 (*)    All other components within normal limits  LACTIC ACID, PLASMA - Abnormal; Notable for the following components:   Lactic Acid, Venous 5.0 (*)    All other components within normal limits  BASIC METABOLIC PANEL - Abnormal; Notable for the following components:   CO2 9 (*)    Glucose, Bld 689 (*)    BUN 48 (*)    Creatinine, Ser 1.63 (*)    GFR, Estimated 37 (*)    Anion gap 31 (*)    All other components within normal limits  LACTIC ACID, PLASMA - Abnormal; Notable for the following components:   Lactic Acid, Venous 3.6 (*)    All other components within normal limits  CBG MONITORING, ED - Abnormal; Notable for the following components:   Glucose-Capillary >600 (*)    All other components within normal limits  I-STAT VENOUS BLOOD GAS, ED - Abnormal; Notable for the following components:   pH, Ven 6.998 (*)    pCO2, Ven 32.9 (*)    Bicarbonate 8.1 (*)    TCO2 9 (*)    Acid-base deficit 23.0 (*)    Sodium 130 (*)    Potassium 6.2 (*)    HCT 48.0 (*)    Hemoglobin 16.3 (*)    All other components within normal limits  CBG MONITORING, ED - Abnormal;  Notable for the following components:   Glucose-Capillary >600 (*)    All other components within normal limits  CBG MONITORING, ED -  Abnormal; Notable for the following components:   Glucose-Capillary >600 (*)    All other components within normal limits  CBG MONITORING, ED - Abnormal; Notable for the following components:   Glucose-Capillary >600 (*)    All other components within normal limits  CBG MONITORING, ED - Abnormal; Notable for the following components:   Glucose-Capillary >600 (*)    All other components within normal limits  I-STAT VENOUS BLOOD GAS, ED - Abnormal; Notable for the following components:   pH, Ven 7.078 (*)    pCO2, Ven 31.2 (*)    pO2, Ven 46 (*)    Bicarbonate 9.2 (*)    TCO2 10 (*)    Acid-base deficit 20.0 (*)    All other components within normal limits  CBG MONITORING, ED - Abnormal; Notable for the following components:   Glucose-Capillary 586 (*)    All other components within normal limits  CBG MONITORING, ED - Abnormal; Notable for the following components:   Glucose-Capillary 597 (*)    All other components within normal limits  RESP PANEL BY RT-PCR (RSV, FLU A&B, COVID)  RVPGX2  LIPASE, BLOOD  BETA-HYDROXYBUTYRIC ACID  BLOOD GAS, VENOUS  TROPONIN I (HIGH SENSITIVITY)  TROPONIN I (HIGH SENSITIVITY)    EKG EKG Interpretation  Date/Time:  Friday July 20 2022 11:29:01 EST Ventricular Rate:  115 PR Interval:  169 QRS Duration: 106 QT Interval:  344 QTC Calculation: 476 R Axis:   56 Text Interpretation: Sinus tachycardia No significant change was found Interpretation limited secondary to artifact Confirmed by Ezequiel Essex [phone]) on 07/20/2022 11:35:08 AM  Radiology CT ABDOMEN PELVIS WO CONTRAST  Result Date: 07/20/2022 CLINICAL DATA:  Abdominal pain. Dizziness, emesis, fever and chills. EXAM: CT ABDOMEN AND PELVIS WITHOUT CONTRAST TECHNIQUE: Multidetector CT imaging of the abdomen and pelvis was performed following the standard  protocol without IV contrast. RADIATION DOSE REDUCTION: This exam was performed according to the departmental dose-optimization program which includes automated exposure control, adjustment of the mA and/or kV according to patient size and/or use of iterative reconstruction technique. COMPARISON:  None Available. FINDINGS: Lower chest: No acute abnormality. Hepatobiliary: No focal liver abnormality is seen. No gallstones, gallbladder wall thickening, or biliary dilatation. Pancreas: Unremarkable. No pancreatic ductal dilatation or surrounding inflammatory changes. Spleen: Normal in size without focal abnormality. Adrenals/Urinary Tract: Adrenal glands are unremarkable. Kidneys are normal, without renal calculi, focal lesion, or hydronephrosis. Simple cyst in the lower pole of the left kidney measuring up to 2.4 cm. Bladder is unremarkable. Stomach/Bowel: Stomach is within normal limits. Appendix not clearly identified, however no inflammatory changes in the pericecal region. No evidence of bowel wall thickening, distention, or inflammatory changes. Vascular/Lymphatic: Mild aortic atherosclerosis. No enlarged abdominal or pelvic lymph nodes. Reproductive: Intrauterine contraceptive device in place. Uterus and adnexa are otherwise unremarkable. Other: Small fat containing umbilical hernia. No abdominopelvic ascites. Musculoskeletal: Degenerate disc disease of the lumbar spine prominent at L5-S1 with vacuum disc phenomena. No acute osseous abnormality. IMPRESSION: 1. No CT evidence of acute abdominal/pelvic process. 2. No evidence of nephrolithiasis or hydronephrosis. 3. Intrauterine contraceptive device in place. 4. Degenerate disc disease of the lumbar spine prominent at L5-S1 with vacuum disc phenomena. 5. Aortic atherosclerosis. Electronically Signed   By: Keane Police D.O.   On: 07/20/2022 13:40    Procedures Procedures    Medications Ordered in ED Medications  insulin regular, human (MYXREDLIN) 100 units/  100 mL infusion (9.5 Units/hr Intravenous Infusion Verify 07/20/22 1702)  dextrose 5 % in lactated  ringers infusion (has no administration in time range)  dextrose 50 % solution 0-50 mL (has no administration in time range)  ondansetron (ZOFRAN) injection 4 mg (4 mg Intravenous Given 07/20/22 1223)  lactated ringers bolus 1,000 mL (0 mLs Intravenous Stopped 07/20/22 1436)  lactated ringers bolus 1,000 mL (0 mLs Intravenous Stopped 07/20/22 1436)  lactated ringers bolus 1,000 mL (0 mLs Intravenous Stopped 07/20/22 1558)  lactated ringers bolus 1,000 mL (0 mLs Intravenous Stopped 07/20/22 1559)  lactated ringers bolus 1,000 mL (1,000 mLs Intravenous New Bag/Given 07/20/22 1713)    ED Course/ Medical Decision Making/ A&P                           Medical Decision Making Amount and/or Complexity of Data Reviewed Labs: ordered. Radiology: ordered. ECG/medicine tests: ordered.  Risk Prescription drug management.  This patient presents to the ED with chief complaint(s) of vomiting, fever, and dizziness with pertinent past medical history of poorly controlled type 1 diabetes mellitus which further complicates the presenting complaint. The complaint involves an extensive differential diagnosis and treatment options and also carries with it a high risk of complications and morbidity.    The differential diagnosis includes DKA, hypoglycemia, pancreatitis, influenza, COVID, gastroenteritis, gastroparesis, intra-abdominal infection  The initial plan is to order hyperglycemia/DKA work up and to treat patient with LR fluid bolus and insulin for DKA  Additional history obtained: Additional history obtained from spouse, who is at bedside with patient Records reviewed Primary Care Documents  Initial Assessment: Exam significant for an ill-appearing patient, nontoxic and nondiaphoretic.  Mucous membranes are dry, especially her mouth and tongue.  Abdomen is soft with generalized tenderness, normal bowel sounds.   She is tachycardic and tachypneic.  Patient is alert and oriented, answering all questions appropriately, however she does ask repetitive questions.  Her husband at bedside reports she has been "foggy" lately.  Patient had large emesis prior to provider entering room.  Lungs clear to auscultation bilaterally.  Reassessment and review (also see workup area): Lab Tests: I ordered and personally interpreted labs.  The pertinent results include:   CBC significant for leukocytosis with an elevated WBC at 29.1. Metabolic panel reveals initial glucose of 1, creatinine 1.97, anion gap of 40.  Potassium is 5.9.  She is hyponatremic and hypochloremic.  Repeat metabolic panel following fluid resuscitation reveals improvement in glucose down to 689, anion gap improved to 31, potassium now within normal limits at 4.5.  No longer hyponatremic or hypochloremic.  Creatinine has shown improvement also and is down to 1.63. Initial lactic was 5.0, following fluid resuscitation improved to 3.6. I-STAT venous blood gas initially revealed pH of 6.998, which improved to 7.078 following fluid resuscitation. Troponin was negative. Beta hydroxybutyric acid was ordered and still pending. Patient negative for COVID, flu, and RSV.  Imaging Studies: I ordered and independently visualized and interpreted the following imaging CT scan abdomen pelvis   which showed no CT evidence of acute intra-abdominal process to explain patient's symptoms.  I agree with radiologist interpretation. The interpretation of the imaging was limited to assessing for emergent pathology, for which purpose it was ordered.  Consultations Obtained: I requested consultation with the consultant Dr. Chase Caller with critical care , and discussed  findings as well as pertinent plan - they recommend: Aggressive fluid resuscitation in addition to insulin and rechecking baseline labs.  Discussed repeat lab findings with Dr. Chase Caller following fluid resuscitation,  which V[facility] patient was showing improvement,  he recommends patient be admitted with [facility].  I requested consultation with on-call hospitalist and spoke with Dr. Verlon Au who agreed with admission.  He recommended patient be admitted to stepdown unit for further treatment/management of DKA.   Reevaluation of the patient after these medicines showed that the patient    improved Patient remained tachycardic following fluid resuscitation, with increase in heart rate to the higher 120s and low 130s.  Her blood pressure has remained stable.  Cardiac Monitoring: The patient was maintained on a cardiac monitor.  I personally viewed and interpreted the cardiac monitor which showed an underlying rhythm of:  sinus tachycardia  Complexity of problems addressed: Patient's presentation is most consistent with  acute presentation with potential threat to life or bodily function  Disposition: After consideration of the diagnostic results and the patient's response to treatment, I feel that the patent would benefit from admission to stepdown unit for management of DKA .   Discussed HPI, physical exam findings, assessment and plan with attending Ezequiel Essex.  The attending physician assessed patient independently as part of a shared visit.         Final Clinical Impression(s) / ED Diagnoses Final diagnoses:  Diabetic ketoacidosis without coma associated with type 1 diabetes mellitus [facility])    Rx / DC Orders ED Discharge Orders     None         Pat Kocher, [place] 07/20/22 1736    Ezequiel Essex, MD 07/21/22 [phone]

## 2022-07-20 NOTE — Assessment & Plan Note (Addendum)
Corrected sodium 153 Pt with DKA most likely causing the profound anion gap metabolic acidosis. Suspect DKA mostly secondary to non-working insulin pump as per pt's own report. Though infection is still possible (see leukocytosis discussion below) BHB ordered and pending still DKA pathway 4L IVF bolus in ED Tele monitor Q4H BMPs

## 2022-07-20 NOTE — Progress Notes (Signed)
   Call from [facility] ER. I am ccm doc at [facility]    Sev DKA   BP 129 HR 123 RR 22   VBG - pH 6.998, PCO 33 at 12.37 Creat 1.97 Lacat 4.9 GAp 40  Gotten insulin gtt on it 1L fluid so far -maybe 2L  Awake and talking  A DKA  Plan  - 2 more liters fluids wide open next hours -> and reases with VBG/ABG in 1h with vitals and lacate, VBG, BMET -> decide on ICU v triad     SIGNATURE    Dr. Brand Males, M.D., F.C.C.P,  Pulmonary and Critical Care Medicine Staff Physician, [place] Director - Interstitial Lung Disease  Program  Medical Director - [facility] [place] at [place], [place], [address]   Pager: [phone], If no answer  -[place] or [place] [phone] Telephone (clinical office): [phone] Telephone (research): [phone]  2:30 PM 07/20/2022

## 2022-07-20 NOTE — Assessment & Plan Note (Addendum)
Probably related to dehydration associated with DKA: 1) corrected sodium 153 2) lactate improved from 5 -> 3.6 after IVF 3) Dont think lactate of "only" 3.6 is high enough to be causing pH of 7.0 nor an anion gap of 31 (on same set of labs)

## 2022-07-20 NOTE — ED Triage Notes (Signed)
Pt arrives to ED with c/o abdominal pain, dizziness, emesis, fever, chills that started at 1am this morning.

## 2022-07-20 NOTE — ED Notes (Signed)
RT note: Provider made aware of VBG results.

## 2022-07-21 ENCOUNTER — Inpatient Hospital Stay (HOSPITAL_COMMUNITY): Payer: PPO

## 2022-07-21 DIAGNOSIS — E1010 Type 1 diabetes mellitus with ketoacidosis without coma: Secondary | ICD-10-CM | POA: Diagnosis not present

## 2022-07-21 LAB — BASIC METABOLIC PANEL
Anion gap: 10 (ref 5–15)
Anion gap: 11 (ref 5–15)
Anion gap: 8 (ref 5–15)
Anion gap: 9 (ref 5–15)
BUN: 38 mg/dL — ABNORMAL HIGH (ref 6–20)
BUN: 41 mg/dL — ABNORMAL HIGH (ref 6–20)
BUN: 34 mg/dL — ABNORMAL HIGH (ref 6–20)
BUN: 30 mg/dL — ABNORMAL HIGH (ref 6–20)
CO2: 22 mmol/L (ref 22–32)
CO2: 23 mmol/L (ref 22–32)
CO2: 24 mmol/L (ref 22–32)
CO2: 24 mmol/L (ref 22–32)
Calcium: 8.9 mg/dL (ref 8.9–10.3)
Calcium: 8.7 mg/dL — ABNORMAL LOW (ref 8.9–10.3)
Calcium: 8.8 mg/dL — ABNORMAL LOW (ref 8.9–10.3)
Calcium: 8.7 mg/dL — ABNORMAL LOW (ref 8.9–10.3)
Chloride: 103 mmol/L (ref 98–111)
Chloride: 104 mmol/L (ref 98–111)
Chloride: 107 mmol/L (ref 98–111)
Chloride: 105 mmol/L (ref 98–111)
Creatinine, Ser: 1.26 mg/dL — ABNORMAL HIGH (ref 0.44–1.00)
Creatinine, Ser: 1.16 mg/dL — ABNORMAL HIGH (ref 0.44–1.00)
Creatinine, Ser: 1.09 mg/dL — ABNORMAL HIGH (ref 0.44–1.00)
Creatinine, Ser: 1.03 mg/dL — ABNORMAL HIGH (ref 0.44–1.00)
GFR, Estimated: 50 mL/min — ABNORMAL LOW (ref >60)
GFR, Estimated: 55 mL/min — ABNORMAL LOW (ref >60)
GFR, Estimated: 60 mL/min — ABNORMAL LOW (ref >60)
GFR, Estimated: >60 mL/min (ref >60)
Glucose, Bld: 227 mg/dL — ABNORMAL HIGH (ref 70–99)
Glucose, Bld: 224 mg/dL — ABNORMAL HIGH (ref 70–99)
Glucose, Bld: 193 mg/dL — ABNORMAL HIGH (ref 70–99)
Glucose, Bld: 201 mg/dL — ABNORMAL HIGH (ref 70–99)
Potassium: 4.4 mmol/L (ref 3.5–5.1)
Potassium: 4.2 mmol/L (ref 3.5–5.1)
Potassium: 3.7 mmol/L (ref 3.5–5.1)
Potassium: 3.6 mmol/L (ref 3.5–5.1)
Sodium: 135 mmol/L (ref 135–145)
Sodium: 138 mmol/L (ref 135–145)
Sodium: 139 mmol/L (ref 135–145)
Sodium: 138 mmol/L (ref 135–145)

## 2022-07-21 LAB — GLUCOSE, CAPILLARY
Glucose-Capillary: 154 mg/dL — ABNORMAL HIGH (ref 70–99)
Glucose-Capillary: 173 mg/dL — ABNORMAL HIGH (ref 70–99)
Glucose-Capillary: 175 mg/dL — ABNORMAL HIGH (ref 70–99)
Glucose-Capillary: 177 mg/dL — ABNORMAL HIGH (ref 70–99)
Glucose-Capillary: 178 mg/dL — ABNORMAL HIGH (ref 70–99)
Glucose-Capillary: 184 mg/dL — ABNORMAL HIGH (ref 70–99)
Glucose-Capillary: 190 mg/dL — ABNORMAL HIGH (ref 70–99)
Glucose-Capillary: 191 mg/dL — ABNORMAL HIGH (ref 70–99)
Glucose-Capillary: 198 mg/dL — ABNORMAL HIGH (ref 70–99)
Glucose-Capillary: 202 mg/dL — ABNORMAL HIGH (ref 70–99)
Glucose-Capillary: 207 mg/dL — ABNORMAL HIGH (ref 70–99)
Glucose-Capillary: 208 mg/dL — ABNORMAL HIGH (ref 70–99)
Glucose-Capillary: 225 mg/dL — ABNORMAL HIGH (ref 70–99)
Glucose-Capillary: 227 mg/dL — ABNORMAL HIGH (ref 70–99)
Glucose-Capillary: 243 mg/dL — ABNORMAL HIGH (ref 70–99)
Glucose-Capillary: 309 mg/dL — ABNORMAL HIGH (ref 70–99)

## 2022-07-21 LAB — BETA-HYDROXYBUTYRIC ACID
Beta-Hydroxybutyric Acid: 1.14 mmol/L — ABNORMAL HIGH (ref 0.05–0.27)
Beta-Hydroxybutyric Acid: 0.69 mmol/L — ABNORMAL HIGH (ref 0.05–0.27)

## 2022-07-21 LAB — LACTIC ACID, PLASMA
Lactic Acid, Venous: 1.6 mmol/L (ref 0.5–1.9)
Lactic Acid, Venous: 1.2 mmol/L (ref 0.5–1.9)

## 2022-07-21 LAB — HIV ANTIBODY (ROUTINE TESTING W REFLEX): HIV Screen 4th Generation wRfx: NONREACTIVE

## 2022-07-21 MED ORDER — INSULIN ASPART 100 UNIT/ML IJ SOLN: 0.0000 [IU] | INTRAMUSCULAR | Status: DC

## 2022-07-21 MED ORDER — INSULIN GLARGINE-YFGN 100 UNIT/ML ~~LOC~~ SOLN
30.0000 [IU] | Freq: Every day | SUBCUTANEOUS | Status: DC
  Administered 2022-07-21 – 2022-07-22 (×2): 30 [IU] via SUBCUTANEOUS
  Filled 2022-07-21 (×3): qty 0.3

## 2022-07-21 MED ORDER — VENLAFAXINE HCL ER 150 MG PO CP24
300.0000 mg | ORAL_CAPSULE | Freq: Every day | ORAL | Status: DC
  Administered 2022-07-21 – 2022-07-23 (×3): 300 mg via ORAL
  Filled 2022-07-21 (×3): qty 2

## 2022-07-21 MED ORDER — INSULIN ASPART 100 UNIT/ML IJ SOLN
0.0000 [IU] | Freq: Every day | INTRAMUSCULAR | Status: DC
  Administered 2022-07-21: 4 [IU] via SUBCUTANEOUS

## 2022-07-21 MED ORDER — ATORVASTATIN CALCIUM 40 MG PO TABS
80.0000 mg | ORAL_TABLET | Freq: Every day | ORAL | Status: DC
  Administered 2022-07-21 – 2022-07-23 (×3): 80 mg via ORAL
  Filled 2022-07-21 (×3): qty 2

## 2022-07-21 MED ORDER — EZETIMIBE 10 MG PO TABS
10.0000 mg | ORAL_TABLET | Freq: Every day | ORAL | Status: DC
  Administered 2022-07-21 – 2022-07-23 (×3): 10 mg via ORAL
  Filled 2022-07-21 (×3): qty 1

## 2022-07-21 MED ORDER — INSULIN ASPART 100 UNIT/ML IJ SOLN
0.0000 [IU] | Freq: Three times a day (TID) | INTRAMUSCULAR | Status: DC
  Administered 2022-07-21: 3 [IU] via SUBCUTANEOUS
  Administered 2022-07-22: 11 [IU] via SUBCUTANEOUS
  Administered 2022-07-22: 8 [IU] via SUBCUTANEOUS
  Administered 2022-07-23: 15 [IU] via SUBCUTANEOUS
  Administered 2022-07-23: 8 [IU] via SUBCUTANEOUS

## 2022-07-21 MED ORDER — INSULIN ASPART 100 UNIT/ML IJ SOLN: 4.0000 [IU] | Freq: Three times a day (TID) | INTRAMUSCULAR | Status: DC

## 2022-07-21 NOTE — Progress Notes (Signed)
PROGRESS NOTE    Kristie [name]  XQJ:[id number] DOB: 1965/08/29 DOA: 07/20/2022 PCP: Tammi Sou, MD   Brief Narrative:  Kristie [name] is a 57 y.o. female with medical history significant of uncontrolled DM1. Pt reports having issues with insulin pump(s), pump not working and unable to get properly working one.  BGLs range between 50 and 400s at home. Onset of N/V abd pain, dizziness last night.  Patient admitted with severe DKA.  HR: 123, RR: 22,.:  6.9, pCO2: 33.  Lactic acid: 4.9, anion gap: 40.  Patient started on IV fluids and insulin gtt. and admitted to progressive unit for further evaluation and management.  Assessment & Plan:   Severe DKA: -Secondary to nonfunctioning insulin pump -Patient started on IV fluids, insulin gtt. -Gap closed.  Started on s Semglee 30 units, NovoLog 4 units 3 times daily with sliding scale insulin. -Will continue to monitor blood sugar every 4 hours -Check A1c  Lactic acidosis: -Likely secondary to dehydration and DKA -Continue IV fluids.  Repeat lactic acid  AKI: Improving with IV fluids  Leukocytosis: Likely reactive.  CT abdomen negative for any acute findings.  Chest x-ray negative  -Will hold off antibiotics at this time.  Hypercholesteremia: Continue home medications  Depression/anxiety: Continue home medications  Thrombocytosis: Likely reactive.   DVT prophylaxis: Lovenox Code Status: Full code Family Communication: Patient husband  present at bedside.  Plan of care discussed with patient in length and she verbalized understanding and agreed with it. Disposition Plan: Home  Consultants:  PCCM Procedures:  None  Antimicrobials:  None  Status is: Inpatient   Subjective: Patient seen and examined.  Resting comfortably on the bed.  Patient's husband at the bedside.  Patient reports that overall she feels better.  Still has some nausea.  Tells me that her mouth is dry and would like to drink something.  No fever,  chills, vomiting or diarrhea or abdominal pain.  objective: Vitals:   07/21/22 0500 07/21/22 0600 07/21/22 0800 07/21/22 1200  BP:      Pulse: (!) 107 (!) 110    Resp: 18 19    Temp:   97.9 F (36.6 C) 97.6 F (36.4 C)  TempSrc:   Oral Oral  SpO2: 91% 96%    Weight:      Height:        Intake/Output Summary (Last 24 hours) at 07/21/2022 1344 Last data filed at 07/21/2022 4481 Gross per 24 hour  Intake 5140.92 ml  Output --  Net 5140.92 ml   Filed Weights   07/20/22 1118  Weight: 102.1 kg    Examination:  General exam: Appears calm and comfortable female, communicating well, appears dry on exam.  Husband at the bedside. Respiratory system: Clear to auscultation. Respiratory effort normal. Cardiovascular system: S1 & S2 heard, RRR. No JVD, murmurs, rubs, gallops or clicks. No pedal edema. Gastrointestinal system: Abdomen is nondistended, soft and nontender. No organomegaly or masses felt. Normal bowel sounds heard. Central nervous system: Alert and oriented. No focal neurological deficits. Extremities: Symmetric 5 x 5 power. Skin: No rashes, lesions or ulcers Psychiatry: Judgement and insight appear normal. Mood & affect appropriate.    Data Reviewed: I have personally reviewed following labs and imaging studies  CBC: Recent Labs  Lab 07/20/22 1120 07/20/22 1237 07/20/22 1610  WBC 29.1*  --   --   HGB 14.6 16.3* 13.6  HCT 48.7* 48.0* 40.0  MCV 95.5  --   --   PLT 405*  --   --  Basic Metabolic Panel: Recent Labs  Lab 07/20/22 2105 07/21/22 0117 07/21/22 0253 07/21/22 0857 07/21/22 1234  NA 135 135 138 139 138  K 4.2 4.4 4.2 3.7 3.6  CL 104 103 104 107 105  CO2 17* '22 23 24 24  '$ GLUCOSE 327* 227* 224* 193* 201*  BUN 45* 38* 41* 34* 30*  CREATININE 1.60* 1.26* 1.16* 1.09* 1.03*  CALCIUM 8.8* 8.9 8.7* 8.8* 8.7*   GFR: Estimated Creatinine Clearance: 74.9 mL/min (A) (by C-G formula based on SCr of 1.03 mg/dL (H)). Liver Function Tests: Recent Labs  Lab  07/20/22 1120  AST 16  ALT 33  ALKPHOS 184*  BILITOT 1.0  PROT 7.8  ALBUMIN 4.8   Recent Labs  Lab 07/20/22 1120  LIPASE 20   No results for input(s): "AMMONIA" in the last 168 hours. Coagulation Profile: No results for input(s): "INR", "PROTIME" in the last 168 hours. Cardiac Enzymes: No results for input(s): "CKTOTAL", "CKMB", "CKMBINDEX", "TROPONINI" in the last 168 hours. BNP (last 3 results) No results for input(s): "PROBNP" in the last 8760 hours. HbA1C: No results for input(s): "HGBA1C" in the last 72 hours. CBG: Recent Labs  Lab 07/21/22 0658 07/21/22 0813 07/21/22 0928 07/21/22 1034 07/21/22 1331  GLUCAP 184* 191* 173* 177* 190*   Lipid Profile: No results for input(s): "CHOL", "HDL", "LDLCALC", "TRIG", "CHOLHDL", "LDLDIRECT" in the last 72 hours. Thyroid Function Tests: No results for input(s): "TSH", "T4TOTAL", "FREET4", "T3FREE", "THYROIDAB" in the last 72 hours. Anemia Panel: No results for input(s): "VITAMINB12", "FOLATE", "FERRITIN", "TIBC", "IRON", "RETICCTPCT" in the last 72 hours. Sepsis Labs: Recent Labs  Lab 07/20/22 1220 07/20/22 1423 07/20/22 1605 07/20/22 2105  PROCALCITON  --   --   --  2.14  LATICACIDVEN 4.9* 5.0* 3.6*  --     Recent Results (from the past 240 hour(s))  Resp panel by RT-PCR (RSV, Flu A&B, Covid) Anterior Nasal Swab     Status: None   Collection Time: 07/20/22 11:20 AM   Specimen: Anterior Nasal Swab  Result Value Ref Range Status   SARS Coronavirus 2 by RT PCR NEGATIVE NEGATIVE Final    Comment: (NOTE) SARS-CoV-2 target nucleic acids are NOT DETECTED.  The SARS-CoV-2 RNA is generally detectable in upper respiratory specimens during the acute phase of infection. The lowest concentration of SARS-CoV-2 viral copies this assay can detect is 138 copies/mL. A negative result does not preclude SARS-Cov-2 infection and should not be used as the sole basis for treatment or other patient management decisions. A negative  result may occur with  improper specimen collection/handling, submission of specimen other than nasopharyngeal swab, presence of viral mutation(s) within the areas targeted by this assay, and inadequate number of viral copies(<138 copies/mL). A negative result must be combined with clinical observations, patient history, and epidemiological information. The expected result is Negative.  Fact Sheet for Patients:  EntrepreneurPulse.com.au  Fact Sheet for Healthcare Providers:  IncredibleEmployment.be  This test is no t yet approved or cleared by the [place] FDA and  has been authorized for detection and/or diagnosis of SARS-CoV-2 by FDA under an Emergency Use Authorization (EUA). This EUA will remain  in effect (meaning this test can be used) for the duration of the COVID-19 declaration under Section 564(b)(1) of the Act, 21 U.S.C.section 360bbb-3(b)(1), unless the authorization is terminated  or revoked sooner.       Influenza A by PCR NEGATIVE NEGATIVE Final   Influenza B by PCR NEGATIVE NEGATIVE Final    Comment: (NOTE) The  Xpert Xpress SARS-CoV-2/FLU/RSV plus assay is intended as an aid in the diagnosis of influenza from Nasopharyngeal swab specimens and should not be used as a sole basis for treatment. Nasal washings and aspirates are unacceptable for Xpert Xpress SARS-CoV-2/FLU/RSV testing.  Fact Sheet for Patients: EntrepreneurPulse.com.au  Fact Sheet for Healthcare Providers: IncredibleEmployment.be  This test is not yet approved or cleared by the [place] FDA and has been authorized for detection and/or diagnosis of SARS-CoV-2 by FDA under an Emergency Use Authorization (EUA). This EUA will remain in effect (meaning this test can be used) for the duration of the COVID-19 declaration under Section 564(b)(1) of the Act, 21 U.S.C. section 360bbb-3(b)(1), unless the authorization is  terminated or revoked.     Resp Syncytial Virus by PCR NEGATIVE NEGATIVE Final    Comment: (NOTE) Fact Sheet for Patients: EntrepreneurPulse.com.au  Fact Sheet for Healthcare Providers: IncredibleEmployment.be  This test is not yet approved or cleared by the [place] FDA and has been authorized for detection and/or diagnosis of SARS-CoV-2 by FDA under an Emergency Use Authorization (EUA). This EUA will remain in effect (meaning this test can be used) for the duration of the COVID-19 declaration under Section 564(b)(1) of the Act, 21 U.S.C. section 360bbb-3(b)(1), unless the authorization is terminated or revoked.  Performed at [facility], [address] [place], [place] [address]   MRSA Next Gen by PCR, Nasal     Status: None   Collection Time: 07/20/22  9:04 PM   Specimen: Nasal Mucosa; Nasal Swab  Result Value Ref Range Status   MRSA by PCR Next Gen NOT DETECTED NOT DETECTED Final    Comment: (NOTE) The GeneXpert MRSA Assay (FDA approved for NASAL specimens only), is one component of a comprehensive MRSA colonization surveillance program. It is not intended to diagnose MRSA infection nor to guide or monitor treatment for MRSA infections. Test performance is not FDA approved in patients less than 61 years old. Performed at [facility], [place] [address], [place], [place] [address]       Radiology Studies: DG CHEST PORT 1 VIEW  Result Date: 07/21/2022 CLINICAL DATA:  Leukocytosis. EXAM: PORTABLE CHEST 1 VIEW COMPARISON:  01/30/2016 FINDINGS: Heart size is normal. No pleural effusion or edema. No airspace opacities identified. Visualized osseous structures are unremarkable. IMPRESSION: No active disease. Electronically Signed   By: Kerby Moors M.D.   On: 07/21/2022 09:51   CT ABDOMEN PELVIS WO CONTRAST  Result Date: 07/20/2022 CLINICAL DATA:  Abdominal pain. Dizziness, emesis, fever and  chills. EXAM: CT ABDOMEN AND PELVIS WITHOUT CONTRAST TECHNIQUE: Multidetector CT imaging of the abdomen and pelvis was performed following the standard protocol without IV contrast. RADIATION DOSE REDUCTION: This exam was performed according to the departmental dose-optimization program which includes automated exposure control, adjustment of the mA and/or kV according to patient size and/or use of iterative reconstruction technique. COMPARISON:  None Available. FINDINGS: Lower chest: No acute abnormality. Hepatobiliary: No focal liver abnormality is seen. No gallstones, gallbladder wall thickening, or biliary dilatation. Pancreas: Unremarkable. No pancreatic ductal dilatation or surrounding inflammatory changes. Spleen: Normal in size without focal abnormality. Adrenals/Urinary Tract: Adrenal glands are unremarkable. Kidneys are normal, without renal calculi, focal lesion, or hydronephrosis. Simple cyst in the lower pole of the left kidney measuring up to 2.4 cm. Bladder is unremarkable. Stomach/Bowel: Stomach is within normal limits. Appendix not clearly identified, however no inflammatory changes in the pericecal region. No evidence of bowel wall thickening, distention, or inflammatory changes. Vascular/Lymphatic:  Mild aortic atherosclerosis. No enlarged abdominal or pelvic lymph nodes. Reproductive: Intrauterine contraceptive device in place. Uterus and adnexa are otherwise unremarkable. Other: Small fat containing umbilical hernia. No abdominopelvic ascites. Musculoskeletal: Degenerate disc disease of the lumbar spine prominent at L5-S1 with vacuum disc phenomena. No acute osseous abnormality. IMPRESSION: 1. No CT evidence of acute abdominal/pelvic process. 2. No evidence of nephrolithiasis or hydronephrosis. 3. Intrauterine contraceptive device in place. 4. Degenerate disc disease of the lumbar spine prominent at L5-S1 with vacuum disc phenomena. 5. Aortic atherosclerosis. Electronically Signed   By: Keane Police D.O.   On: 07/20/2022 13:40    Scheduled Meds:  Chlorhexidine Gluconate Cloth  6 each Topical Q0600   enoxaparin (LOVENOX) injection  40 mg Subcutaneous Q24H   insulin aspart  0-9 Units Subcutaneous Q4H   insulin aspart  4 Units Subcutaneous TID WC   insulin glargine-yfgn  30 Units Subcutaneous Daily   Continuous Infusions:  dextrose 5% lactated ringers 125 mL/hr at 07/21/22 0624   insulin 3 Units/hr (07/21/22 0624)   lactated ringers Stopped (07/20/22 2320)     LOS: 1 day   Time spent: 35 minutes   Blake Goya Loann Quill, MD [facility]  If 7PM-7AM, please contact night-coverage [email or web address] 07/21/2022, 1:44 PM

## 2022-07-21 NOTE — Inpatient Diabetes Management (Signed)
Inpatient Diabetes Program Recommendations  AACE/ADA: New Consensus Statement on Inpatient Glycemic Control   Target Ranges:  Prepandial:   less than 140 mg/dL      Peak postprandial:   less than 180 mg/dL (1-2 hours)      Critically ill patients:  140 - 180 mg/dL    Latest Reference Range & Units 07/21/22 04:13 07/21/22 05:20 07/21/22 06:16 07/21/22 06:58  Glucose-Capillary 70 - 99 mg/dL 227 (H) 198 (H) 178 (H) 184 (H)    Latest Reference Range & Units 07/20/22 11:20 07/21/22 2:53  CO2 22 - 32 mmol/L 7 (L) 23  Glucose 70 - 99 mg/dL 1,091 (HH) 224  Anion gap 5 - 15  40 (H) 11  Beta-Hydroxybutyric acid 0.05-0.27 mmol/L >8.00 (H) 1.14 (H)   Review of Glycemic Control  Diabetes history: DM1 (does not make any insulin; requires basal, correction, and carbohydrate coverage insulin) Outpatient Diabetes medications: Medtronic insulin pump with Novolog, FreeStyle Libre2 CGM Current orders for Inpatient glycemic control: IV insulin  Inpatient Diabetes Program Recommendations:    Insulin: Once acidosis resolved and provider is ready to transition from IV to SQ insulin, please consider ordering Semglee 30 units Q24H (based on 102 kg x 0.3 units), CBGs Q4H, Novolog 0-9 units Q4H, and Novolog 4 units TID with meals for meal coverage if patient eats at least 50% of meals.  Outpatient DM control: Per PCP note on 06/25/22 patient does not feel insulin pump is ideal for her. Per H&P on 07/20/22, patient is having issues with insulin pump not working and unable to get properly working one. Therefore, at discharge would recommend to discharge patient on SQ insulin regimen with basal, correction, and meal coverage insulin.   NOTE: Noted consult for Diabetes Coordinator. Diabetes Coordinator is not on campus over the weekend but available by pager from 8am to 5pm for questions or concerns. Patient admitted 07/20/22 with DKA, lactic acidosis, AKI, and leukocytosis. Initial glucose 1091 mg/dl and patient was  started on IV insulin drip. Patient currently still ordered IV insulin.  Per chart, patient uses Medtronic insulin pump and sees Dr. Cruzita Lederer (Endocrinologist). Last note by Dr. Cruzita Lederer from office visit on 04/05/22 notes A1C 8.9%, extremely variable glucose trends, and  the following should be insulin pump settings:  Basal rates:  12 am: 1.4 4 am: 2.0 9 am: 1.8 12 pm: 1.8 9 pm: 1.4 Total daily basal insulin: 41.4 units  ICR:  12 am: 2.4 grams 5 am: 2.4 grams 4:30 pm: 2.8 grams  ISF:  12 am: 60 mg/dl 5 am: 50 mg/dl 4:30 pm: 80 mg/dl 9 pm: 80 mg/dl  Target Glucose: 100-130 mg/dl  Also noted patient sen Dr. Anitra Lauth (PCP) on 06/25/22 which notes, "She requests a referral to a new endocrinologist. She does not feel like the insulin pump is ideal for her. Says that her eating pattern is very irregular, describes some frustration and adjusting doses correctly. She no longer takes metformin because it caused too much diarrhea."  Per H&P on 07/20/22, patient reported issues with insulin pump not working and unable to get properly working pump. Therefore, would recommend to discharge patient on SQ insulin regimen of basal, meal coverage, and correction insulin and have patient follow up with PCP or Endocrinologist.  Thanks, Barnie Alderman, RN, MSN, [place] Diabetes Coordinator Inpatient Diabetes Program [phone] (Team Pager from 8am to [place])

## 2022-07-22 DIAGNOSIS — E1010 Type 1 diabetes mellitus with ketoacidosis without coma: Secondary | ICD-10-CM | POA: Diagnosis not present

## 2022-07-22 LAB — GLUCOSE, CAPILLARY
Glucose-Capillary: 305 mg/dL — ABNORMAL HIGH (ref 70–99)
Glucose-Capillary: 277 mg/dL — ABNORMAL HIGH (ref 70–99)
Glucose-Capillary: 105 mg/dL — ABNORMAL HIGH (ref 70–99)

## 2022-07-22 LAB — BASIC METABOLIC PANEL
Anion gap: 8 (ref 5–15)
BUN: 24 mg/dL — ABNORMAL HIGH (ref 6–20)
CO2: 23 mmol/L (ref 22–32)
Calcium: 8.3 mg/dL — ABNORMAL LOW (ref 8.9–10.3)
Chloride: 104 mmol/L (ref 98–111)
Creatinine, Ser: 1.04 mg/dL — ABNORMAL HIGH (ref 0.44–1.00)
GFR, Estimated: >60 mL/min (ref >60)
Glucose, Bld: 360 mg/dL — ABNORMAL HIGH (ref 70–99)
Potassium: 3.9 mmol/L (ref 3.5–5.1)
Sodium: 135 mmol/L (ref 135–145)

## 2022-07-22 LAB — CBC
HCT: 35.4 % — ABNORMAL LOW (ref 36.0–46.0)
Hemoglobin: 11.7 g/dL — ABNORMAL LOW (ref 12.0–15.0)
MCH: 28.8 pg (ref 26.0–34.0)
MCHC: 33.1 g/dL (ref 30.0–36.0)
MCV: 87.2 fL (ref 80.0–100.0)
Platelets: 177 10*3/uL (ref 150–400)
RBC: 4.06 MIL/uL (ref 3.87–5.11)
RDW: 14 % (ref 11.5–15.5)
WBC: 7.5 10*3/uL (ref 4.0–10.5)
nRBC: 0 % (ref 0.0–0.2)

## 2022-07-22 LAB — MAGNESIUM: Magnesium: 2 mg/dL (ref 1.7–2.4)

## 2022-07-22 LAB — BETA-HYDROXYBUTYRIC ACID: Beta-Hydroxybutyric Acid: 0.33 mmol/L — ABNORMAL HIGH (ref 0.05–0.27)

## 2022-07-22 MED ORDER — INSULIN ASPART 100 UNIT/ML IJ SOLN
5.0000 [IU] | Freq: Three times a day (TID) | INTRAMUSCULAR | Status: DC
  Administered 2022-07-22 – 2022-07-23 (×4): 5 [IU] via SUBCUTANEOUS

## 2022-07-22 MED ORDER — INSULIN GLARGINE-YFGN 100 UNIT/ML ~~LOC~~ SOLN
35.0000 [IU] | Freq: Every day | SUBCUTANEOUS | Status: DC
  Administered 2022-07-23: 35 [IU] via SUBCUTANEOUS
  Filled 2022-07-22: qty 0.35

## 2022-07-22 NOTE — Progress Notes (Addendum)
PROGRESS NOTE    Kristie [name]  TIW:[id number] DOB: 15-Jul-1966 DOA: 07/20/2022 PCP: Tammi Sou, MD   Brief Narrative:  Kristie [name] is a 57 y.o. female with medical history significant of uncontrolled DM1. Pt reports having issues with insulin pump(s), pump not working and unable to get properly working one.  BGLs range between 50 and 400s at home. Onset of N/V abd pain, dizziness last night.  Patient admitted with severe DKA.  HR: 123, RR: 22,.:  6.9, pCO2: 33.  Lactic acid: 4.9, anion gap: 40.  Patient started on IV fluids and insulin gtt. and admitted to progressive unit for further evaluation and management.  Assessment & Plan:   Severe DKA: -Secondary to nonfunctioning insulin pump -Patient started on IV fluids, insulin gtt. -Gap closed.  Started on  Semglee 30 units, NovoLog 4 units 3 times daily with sliding scale insulin. -CBG: 305 this a.m.  Increase Semglee to 35 units and NovoLog 5 units 3 times daily with meals along with sliding scale.  Appreciate diabetes coordinators help -Will continue to monitor blood sugar every 4 hours -Last A1c from 04/05/2022: 8.9%.  Repeat A1c pending.  Will transfer patient to the floor. -Patient needs subcu insulin regime at the time of discharge.  Needs to see endocrinology outpatient.  Lactic acidosis: -Likely secondary to dehydration and DKA -Resolved  AKI: Improving with IV fluids  Leukocytosis: Likely reactive.  CT abdomen negative for any acute findings.  Chest x-ray negative  -Will hold off antibiotics at this time.  Leukocytosis resolved  Hypercholesteremia: Continue home medications  Depression/anxiety: Continue home medications  Thrombocytosis: Likely reactive. Resolved   DVT prophylaxis: Lovenox Code Status: Full code Family Communication: None present at bedside.  Plan of care discussed with patient in length and she verbalized understanding and agreed with it. Disposition Plan: Home  Consultants:   PCCM Procedures:  None  Antimicrobials:  None  Status is: Inpatient   Subjective: Patient seen and examined.  Resting comfortably on the bed.  Reports much improvement in overall symptoms.  Still feel little nauseous.  Remained afebrile.  No acute events overnight.  Remained tachycardic.  objective: Vitals:   07/22/22 0400 07/22/22 0500 07/22/22 0600 07/22/22 0828  BP:      Pulse: (!) 102 100 96   Resp: 19 (!) 21 18   Temp: 99.1 F (37.3 C)   97.8 F (36.6 C)  TempSrc: Oral   Oral  SpO2: 92% 94% 96%   Weight:      Height:        Intake/Output Summary (Last 24 hours) at 07/22/2022 0900 Last data filed at 07/22/2022 0200 Gross per 24 hour  Intake 1246.72 ml  Output 1 ml  Net 1245.72 ml    Filed Weights   07/20/22 1118  Weight: 102.1 kg    Examination:  General exam: Appears calm and comfortable female, communicating well, appears dry on exam.   Respiratory system: Clear to auscultation. Respiratory effort normal. Cardiovascular system: S1 & S2 heard, RRR. No JVD, murmurs, rubs, gallops or clicks. No pedal edema. Gastrointestinal system: Abdomen is nondistended, soft and nontender. No organomegaly or masses felt. Normal bowel sounds heard. Central nervous system: Alert and oriented. No focal neurological deficits. Extremities: Symmetric 5 x 5 power. Skin: No rashes, lesions or ulcers Psychiatry: Judgement and insight appear normal. Mood & affect appropriate.    Data Reviewed: I have personally reviewed following labs and imaging studies  CBC: Recent Labs  Lab 07/20/22 1120 07/20/22 1237  07/20/22 1610 07/22/22 0329  WBC 29.1*  --   --  7.5  HGB 14.6 16.3* 13.6 11.7*  HCT 48.7* 48.0* 40.0 35.4*  MCV 95.5  --   --  87.2  PLT 405*  --   --  149    Basic Metabolic Panel: Recent Labs  Lab 07/21/22 0117 07/21/22 0253 07/21/22 0857 07/21/22 1234 07/22/22 0329  NA 135 138 139 138 135  K 4.4 4.2 3.7 3.6 3.9  CL 103 104 107 105 104  CO2 '22 23 24 24 23   '$ GLUCOSE 227* 224* 193* 201* 360*  BUN 38* 41* 34* 30* 24*  CREATININE 1.26* 1.16* 1.09* 1.03* 1.04*  CALCIUM 8.9 8.7* 8.8* 8.7* 8.3*  MG  --   --   --   --  2.0    GFR: Estimated Creatinine Clearance: 74.2 mL/min (A) (by C-G formula based on SCr of 1.04 mg/dL (H)). Liver Function Tests: Recent Labs  Lab 07/20/22 1120  AST 16  ALT 33  ALKPHOS 184*  BILITOT 1.0  PROT 7.8  ALBUMIN 4.8    Recent Labs  Lab 07/20/22 1120  LIPASE 20    No results for input(s): "AMMONIA" in the last 168 hours. Coagulation Profile: No results for input(s): "INR", "PROTIME" in the last 168 hours. Cardiac Enzymes: No results for input(s): "CKTOTAL", "CKMB", "CKMBINDEX", "TROPONINI" in the last 168 hours. BNP (last 3 results) No results for input(s): "PROBNP" in the last 8760 hours. HbA1C: No results for input(s): "HGBA1C" in the last 72 hours. CBG: Recent Labs  Lab 07/21/22 1331 07/21/22 1448 07/21/22 1611 07/21/22 2100 07/22/22 0736  GLUCAP 190* 208* 175* 309* 305*    Lipid Profile: No results for input(s): "CHOL", "HDL", "LDLCALC", "TRIG", "CHOLHDL", "LDLDIRECT" in the last 72 hours. Thyroid Function Tests: No results for input(s): "TSH", "T4TOTAL", "FREET4", "T3FREE", "THYROIDAB" in the last 72 hours. Anemia Panel: No results for input(s): "VITAMINB12", "FOLATE", "FERRITIN", "TIBC", "IRON", "RETICCTPCT" in the last 72 hours. Sepsis Labs: Recent Labs  Lab 07/20/22 1423 07/20/22 1605 07/20/22 2105 07/21/22 1430 07/21/22 1659  PROCALCITON  --   --  2.14  --   --   LATICACIDVEN 5.0* 3.6*  --  1.6 1.2     Recent Results (from the past 240 hour(s))  Resp panel by RT-PCR (RSV, Flu A&B, Covid) Anterior Nasal Swab     Status: None   Collection Time: 07/20/22 11:20 AM   Specimen: Anterior Nasal Swab  Result Value Ref Range Status   SARS Coronavirus 2 by RT PCR NEGATIVE NEGATIVE Final    Comment: (NOTE) SARS-CoV-2 target nucleic acids are NOT DETECTED.  The SARS-CoV-2 RNA is  generally detectable in upper respiratory specimens during the acute phase of infection. The lowest concentration of SARS-CoV-2 viral copies this assay can detect is 138 copies/mL. A negative result does not preclude SARS-Cov-2 infection and should not be used as the sole basis for treatment or other patient management decisions. A negative result may occur with  improper specimen collection/handling, submission of specimen other than nasopharyngeal swab, presence of viral mutation(s) within the areas targeted by this assay, and inadequate number of viral copies(<138 copies/mL). A negative result must be combined with clinical observations, patient history, and epidemiological information. The expected result is Negative.  Fact Sheet for Patients:  EntrepreneurPulse.com.au  Fact Sheet for Healthcare Providers:  IncredibleEmployment.be  This test is no t yet approved or cleared by the [place] FDA and  has been authorized for detection and/or diagnosis of  SARS-CoV-2 by FDA under an Emergency Use Authorization (EUA). This EUA will remain  in effect (meaning this test can be used) for the duration of the COVID-19 declaration under Section 564(b)(1) of the Act, 21 U.S.C.section 360bbb-3(b)(1), unless the authorization is terminated  or revoked sooner.       Influenza A by PCR NEGATIVE NEGATIVE Final   Influenza B by PCR NEGATIVE NEGATIVE Final    Comment: (NOTE) The Xpert Xpress SARS-CoV-2/FLU/RSV plus assay is intended as an aid in the diagnosis of influenza from Nasopharyngeal swab specimens and should not be used as a sole basis for treatment. Nasal washings and aspirates are unacceptable for Xpert Xpress SARS-CoV-2/FLU/RSV testing.  Fact Sheet for Patients: EntrepreneurPulse.com.au  Fact Sheet for Healthcare Providers: IncredibleEmployment.be  This test is not yet approved or cleared by the [place] FDA and has been authorized for detection and/or diagnosis of SARS-CoV-2 by FDA under an Emergency Use Authorization (EUA). This EUA will remain in effect (meaning this test can be used) for the duration of the COVID-19 declaration under Section 564(b)(1) of the Act, 21 U.S.C. section 360bbb-3(b)(1), unless the authorization is terminated or revoked.     Resp Syncytial Virus by PCR NEGATIVE NEGATIVE Final    Comment: (NOTE) Fact Sheet for Patients: EntrepreneurPulse.com.au  Fact Sheet for Healthcare Providers: IncredibleEmployment.be  This test is not yet approved or cleared by the [place] FDA and has been authorized for detection and/or diagnosis of SARS-CoV-2 by FDA under an Emergency Use Authorization (EUA). This EUA will remain in effect (meaning this test can be used) for the duration of the COVID-19 declaration under Section 564(b)(1) of the Act, 21 U.S.C. section 360bbb-3(b)(1), unless the authorization is terminated or revoked.  Performed at [facility], [address], [place], [place] [address]   MRSA Next Gen by PCR, Nasal     Status: None   Collection Time: 07/20/22  9:04 PM   Specimen: Nasal Mucosa; Nasal Swab  Result Value Ref Range Status   MRSA by PCR Next Gen NOT DETECTED NOT DETECTED Final    Comment: (NOTE) The GeneXpert MRSA Assay (FDA approved for NASAL specimens only), is one component of a comprehensive MRSA colonization surveillance program. It is not intended to diagnose MRSA infection nor to guide or monitor treatment for MRSA infections. Test performance is not FDA approved in patients less than 53 years old. Performed at [facility], [place] [address] [place], [place] [address]       Radiology Studies: DG CHEST PORT 1 VIEW  Result Date: 07/21/2022 CLINICAL DATA:  Leukocytosis. EXAM: PORTABLE CHEST 1 VIEW COMPARISON:  01/30/2016 FINDINGS: Heart size is  normal. No pleural effusion or edema. No airspace opacities identified. Visualized osseous structures are unremarkable. IMPRESSION: No active disease. Electronically Signed   By: Kerby Moors M.D.   On: 07/21/2022 09:51   CT ABDOMEN PELVIS WO CONTRAST  Result Date: 07/20/2022 CLINICAL DATA:  Abdominal pain. Dizziness, emesis, fever and chills. EXAM: CT ABDOMEN AND PELVIS WITHOUT CONTRAST TECHNIQUE: Multidetector CT imaging of the abdomen and pelvis was performed following the standard protocol without IV contrast. RADIATION DOSE REDUCTION: This exam was performed according to the departmental dose-optimization program which includes automated exposure control, adjustment of the mA and/or kV according to patient size and/or use of iterative reconstruction technique. COMPARISON:  None Available. FINDINGS: Lower chest: No acute abnormality. Hepatobiliary: No focal liver abnormality is seen. No gallstones, gallbladder wall thickening, or biliary dilatation. Pancreas: Unremarkable. No pancreatic ductal  dilatation or surrounding inflammatory changes. Spleen: Normal in size without focal abnormality. Adrenals/Urinary Tract: Adrenal glands are unremarkable. Kidneys are normal, without renal calculi, focal lesion, or hydronephrosis. Simple cyst in the lower pole of the left kidney measuring up to 2.4 cm. Bladder is unremarkable. Stomach/Bowel: Stomach is within normal limits. Appendix not clearly identified, however no inflammatory changes in the pericecal region. No evidence of bowel wall thickening, distention, or inflammatory changes. Vascular/Lymphatic: Mild aortic atherosclerosis. No enlarged abdominal or pelvic lymph nodes. Reproductive: Intrauterine contraceptive device in place. Uterus and adnexa are otherwise unremarkable. Other: Small fat containing umbilical hernia. No abdominopelvic ascites. Musculoskeletal: Degenerate disc disease of the lumbar spine prominent at L5-S1 with vacuum disc phenomena. No acute  osseous abnormality. IMPRESSION: 1. No CT evidence of acute abdominal/pelvic process. 2. No evidence of nephrolithiasis or hydronephrosis. 3. Intrauterine contraceptive device in place. 4. Degenerate disc disease of the lumbar spine prominent at L5-S1 with vacuum disc phenomena. 5. Aortic atherosclerosis. Electronically Signed   By: Keane Police D.O.   On: 07/20/2022 13:40    Scheduled Meds:  atorvastatin  80 mg Oral Daily   Chlorhexidine Gluconate Cloth  6 each Topical Q0600   enoxaparin (LOVENOX) injection  40 mg Subcutaneous Q24H   ezetimibe  10 mg Oral Daily   insulin aspart  0-15 Units Subcutaneous TID WC   insulin aspart  0-5 Units Subcutaneous QHS   insulin glargine-yfgn  30 Units Subcutaneous Daily   venlafaxine XR  300 mg Oral Daily   Continuous Infusions:  dextrose 5% lactated ringers Stopped (07/21/22 1610)   insulin Stopped (07/21/22 1623)   lactated ringers Stopped (07/20/22 2320)     LOS: 2 days   Time spent: 35 minutes   Marlane Hirschmann Loann Quill, MD [facility]  If 7PM-7AM, please contact night-coverage [email or web address] 07/22/2022, 9:00 AM

## 2022-07-22 NOTE — Plan of Care (Signed)
  Problem: Education: Goal: Knowledge of General Education information will improve Description Including pain rating scale, medication(s)/side effects and non-pharmacologic comfort measures Outcome: Progressing   Problem: Activity: Goal: Risk for activity intolerance will decrease Outcome: Progressing   Problem: Safety: Goal: Ability to remain free from injury will improve Outcome: Progressing   

## 2022-07-22 NOTE — TOC Progression Note (Signed)
Transition of Care [facility]) - Progression Note    Patient Details  Name: Kristie [name] MRN: [id number] Date of Birth: 10/11/1965  Transition [facility] Care [facility]) CM/SW Contact  Servando Snare, [place] Phone Number: 07/22/2022, 9:21 AM  Clinical Narrative:      Transition of Care (TOC) Screening Note   Patient Details  Name: Kristie [name] Date of Birth: 1965-08-25   Transition of Care [facility]) CM/SW Contact:    Servando Snare, LCSW Phone Number: 07/22/2022, 9:21 AM    Transition of Care Department [facility]) has reviewed patient and no TOC needs have been identified at this time. We will continue to monitor patient advancement through interdisciplinary progression rounds. If new patient transition needs arise, please place a TOC consult.         Expected Discharge Plan and Services                                               Social Determinants of Health (SDOH) Interventions SDOH Screenings   Food Insecurity: Food Insecurity Present (07/20/2022)  Housing: Medium Risk (07/20/2022)  Transportation Needs: No Transportation Needs (07/20/2022)  Utilities: Not At Risk (07/20/2022)  Alcohol Screen: Low Risk  (02/15/2021)  Depression (PHQ2-9): Medium Risk (06/25/2022)  Financial Resource Strain: Low Risk  (02/21/2022)  Physical Activity: Inactive (02/21/2022)  Social Connections: Moderately Integrated (02/21/2022)  Stress: No Stress Concern Present (02/21/2022)  Tobacco Use: Low Risk  (07/20/2022)    Readmission Risk Interventions     No data to display

## 2022-07-22 NOTE — Inpatient Diabetes Management (Signed)
Inpatient Diabetes Program Recommendations  AACE/ADA: New Consensus Statement on Inpatient Glycemic Control   Target Ranges:  Prepandial:   less than 140 mg/dL      Peak postprandial:   less than 180 mg/dL (1-2 hours)      Critically ill patients:  140 - 180 mg/dL    Latest Reference Range & Units 07/21/22 13:31 07/21/22 14:48 07/21/22 16:11 07/21/22 21:00 07/22/22 07:36  Glucose-Capillary 70 - 99 mg/dL 190 (H) 208 (H) 175 (H) 309 (H) 305 (H)    Latest Reference Range & Units 07/21/22 06:58 07/21/22 08:13 07/21/22 09:28 07/21/22 10:34 07/21/22 11:37  Glucose-Capillary 70 - 99 mg/dL 184 (H) 191 (H) 173 (H) 177 (H) 154 (H)    Latest Reference Range & Units 07/20/22 11:20  CO2 22 - 32 mmol/L 7 (L)  Glucose 70 - 99 mg/dL 1,091 (HH)  Anion gap 5 - 15  40 (H)   Review of Glycemic Control  Diabetes history: DM1 (does not make any insulin; requires basal, correction, and carbohydrate coverage insulin) Outpatient Diabetes medications: Medtronic insulin pump with Novolog, FreeStyle Libre2 CGM Current orders for Inpatient glycemic control: Semglee 30 units daily, Novolog 0-15 units TID with meals, Novolog 0-5 units QHS   Inpatient Diabetes Program Recommendations:     Insulin: Please consider increasing Semglee 35 units daily and ordering Novolog 5 units TID with meals for meal coverage if patient eats at least 50% of meals.   Outpatient DM control: Per PCP note on 06/25/22 patient does not feel insulin pump is ideal for her. Per H&P on 07/20/22, patient is having issues with insulin pump not working and unable to get properly working one. Therefore, at discharge would recommend to discharge patient on SQ insulin regimen with basal, correction, and meal coverage insulin.    Thanks, Barnie Alderman, RN, MSN, [place] Diabetes Coordinator Inpatient Diabetes Program [phone] (Team Pager from 8am to [place])

## 2022-07-23 DIAGNOSIS — E1010 Type 1 diabetes mellitus with ketoacidosis without coma: Secondary | ICD-10-CM | POA: Diagnosis not present

## 2022-07-23 DIAGNOSIS — N179 Acute kidney failure, unspecified: Secondary | ICD-10-CM | POA: Diagnosis not present

## 2022-07-23 DIAGNOSIS — E872 Acidosis, unspecified: Secondary | ICD-10-CM | POA: Diagnosis not present

## 2022-07-23 DIAGNOSIS — D72829 Elevated white blood cell count, unspecified: Secondary | ICD-10-CM | POA: Diagnosis not present

## 2022-07-23 LAB — BASIC METABOLIC PANEL
Anion gap: 9 (ref 5–15)
BUN: 18 mg/dL (ref 6–20)
CO2: 25 mmol/L (ref 22–32)
Calcium: 8.6 mg/dL — ABNORMAL LOW (ref 8.9–10.3)
Chloride: 105 mmol/L (ref 98–111)
Creatinine, Ser: 0.83 mg/dL (ref 0.44–1.00)
GFR, Estimated: >60 mL/min (ref >60)
Glucose, Bld: 168 mg/dL — ABNORMAL HIGH (ref 70–99)
Potassium: 3.4 mmol/L — ABNORMAL LOW (ref 3.5–5.1)
Sodium: 139 mmol/L (ref 135–145)

## 2022-07-23 LAB — HEMOGLOBIN A1C
Hgb A1c MFr Bld: 9.9 % — ABNORMAL HIGH (ref 4.8–5.6)
Mean Plasma Glucose: 237 mg/dL

## 2022-07-23 LAB — GLUCOSE, CAPILLARY
Glucose-Capillary: 265 mg/dL — ABNORMAL HIGH (ref 70–99)
Glucose-Capillary: 397 mg/dL — ABNORMAL HIGH (ref 70–99)

## 2022-07-23 MED ORDER — POTASSIUM CHLORIDE CRYS ER 20 MEQ PO TBCR
40.0000 meq | EXTENDED_RELEASE_TABLET | Freq: Once | ORAL | Status: AC
  Administered 2022-07-23: 40 meq via ORAL
  Filled 2022-07-23: qty 2

## 2022-07-23 MED ORDER — INSULIN GLARGINE 100 UNIT/ML SOLOSTAR PEN: 38.0000 [IU] | PEN_INJECTOR | Freq: Every day | SUBCUTANEOUS | 1 refills | Status: DC

## 2022-07-23 MED ORDER — NOVOLOG FLEXPEN RELION 100 UNIT/ML ~~LOC~~ SOPN: 5.0000 [IU] | PEN_INJECTOR | Freq: Three times a day (TID) | SUBCUTANEOUS | 11 refills | Status: DC

## 2022-07-23 MED ORDER — INSULIN PEN NEEDLE 32G X 8 MM MISC: 0 refills | Status: AC

## 2022-07-23 MED ORDER — INSULIN ASPART 100 UNIT/ML FLEXPEN: PEN_INJECTOR | SUBCUTANEOUS | 2 refills | Status: DC

## 2022-07-23 NOTE — Discharge Summary (Addendum)
Physician Discharge Summary  Kristie [name] OAC:[id number] DOB: 04-Feb-1966 DOA: 07/20/2022  PCP: Tammi Sou, MD  Admit date: 07/20/2022 Discharge date: 07/23/2022  Admitted From: Home  Discharge disposition: Home   Recommendations for Outpatient Follow-Up:   Follow up with your primary care provider in one week.  Check CBC, BMP, magnesium in the next visit Follow-up with endocrinology in 1 to 2 weeks  Discharge Diagnosis:   Principal Problem:   DKA, type 1 ([place]) Active Problems:   Lactic acidosis   Leukocytosis   AKI (acute kidney injury) ([place])   Discharge Condition: Improved.  Diet recommendation: Low sodium, heart healthy.  Carbohydrate-modified.    Wound care: None.  Code status: Full.   History of Present Illness:   Kristie [name] is a 57 y.o. female with past medical history of uncontrolled type 1 diabetes presented to hospital with elevated blood glucose level after her insulin pump malfunction.  In the ED patient was noted to be in severe diabetic ketoacidosis was tachycardic to buccini with lactate of 4.9 and anion gap of 40.  Patient was then started on insulin drip and was admitted to the progressive care unit.     Hospital Course:   Following conditions were addressed during hospitalization as listed below,  Severe DKA: Secondary to nonfunctioning insulin pump.  Patient received insulin drip during hospitalization.  Seen by diabetic coordinator.  At this time patient will be discharged home on 38 units of long-acting insulin and 5 units 3 times daily of NovoLog with sliding scale insulin.  She will discontinue her pump until seen by her endocrinologist.  Insulin pen will be prescribed.  Spoke with the patient's spouse at bedside. Last A1c from 04/05/2022: 8.9%.  Repeat hemoglobin A1c from 07/21/2022 at 9.9.  Lactic acidosis: -Likely secondary to dehydration and DKA.has resolved  AKI: Improved with IV fluids.  Potassium of 3.4.  Replaced prior to  discharge.  Leukocytosis: Likely reactive.  CT abdomen negative for any acute findings.  Chest x-ray negative.  Hold off with antibiotic.  WBC count at 7.5.  Hypercholesteremia: Continue Lipitor from home.  Depression/anxiety: Continue Klonopin, Effexor from home.  Thrombocytosis: Likely reactive.Resolved  Obesity.  Present on admission. Body mass index is 35.24 kg/m.  Would benefit from weight loss as outpatient.  Disposition.  At this time, patient is stable for disposition home with outpatient PCP and endocrinology follow-up.  Spoke with the patient's spouse at bedside.  Medical Consultants:   None.  Procedures:    None Subjective:   Today, patient was seen and examined at bedside.  Feels okay.  Denies any nausea, vomiting fever chills.  Discharge Exam:   Vitals:   07/23/22 0122 07/23/22 0616  BP: 133/80 (!) 140/80  Pulse: 90 82  Resp: 16 16  Temp: 98.3 F (36.8 C) 98 F (36.7 C)  SpO2: 94% 95%   Vitals:   07/22/22 1544 07/22/22 2219 07/23/22 0122 07/23/22 0616  BP: 136/82 133/77 133/80 (!) 140/80  Pulse: (!) 103 95 90 82  Resp: '18 16 16 16  '$ Temp: 98.5 F (36.9 C) 98.2 F (36.8 C) 98.3 F (36.8 C) 98 F (36.7 C)  TempSrc: Oral Oral Oral Oral  SpO2:  99% 94% 95%  Weight:      Height:      Body mass index is 35.24 kg/m.   General: Alert awake, not in obvious distress obese HENT: pupils equally reacting to light,  No scleral pallor or icterus noted. Oral mucosa is moist.  Chest:  Clear breath sounds.  Diminished breath sounds bilaterally. No crackles or wheezes.  CVS: S1 &S2 heard. No murmur.  Regular rate and rhythm. Abdomen: Soft, nontender, nondistended.  Bowel sounds are heard.   Extremities: No cyanosis, clubbing or edema.  Peripheral pulses are palpable. Psych: Alert, awake and oriented, normal mood CNS:  No cranial nerve deficits.  Power equal in all extremities.   Skin: Warm and dry.  No rashes noted.  The results of significant diagnostics  from this hospitalization (including imaging, microbiology, ancillary and laboratory) are listed below for reference.     Diagnostic Studies:   DG CHEST PORT 1 VIEW  Result Date: 07/21/2022 CLINICAL DATA:  Leukocytosis. EXAM: PORTABLE CHEST 1 VIEW COMPARISON:  01/30/2016 FINDINGS: Heart size is normal. No pleural effusion or edema. No airspace opacities identified. Visualized osseous structures are unremarkable. IMPRESSION: No active disease. Electronically Signed   By: Kerby Moors M.D.   On: 07/21/2022 09:51   CT ABDOMEN PELVIS WO CONTRAST  Result Date: 07/20/2022 CLINICAL DATA:  Abdominal pain. Dizziness, emesis, fever and chills. EXAM: CT ABDOMEN AND PELVIS WITHOUT CONTRAST TECHNIQUE: Multidetector CT imaging of the abdomen and pelvis was performed following the standard protocol without IV contrast. RADIATION DOSE REDUCTION: This exam was performed according to the departmental dose-optimization program which includes automated exposure control, adjustment of the mA and/or kV according to patient size and/or use of iterative reconstruction technique. COMPARISON:  None Available. FINDINGS: Lower chest: No acute abnormality. Hepatobiliary: No focal liver abnormality is seen. No gallstones, gallbladder wall thickening, or biliary dilatation. Pancreas: Unremarkable. No pancreatic ductal dilatation or surrounding inflammatory changes. Spleen: Normal in size without focal abnormality. Adrenals/Urinary Tract: Adrenal glands are unremarkable. Kidneys are normal, without renal calculi, focal lesion, or hydronephrosis. Simple cyst in the lower pole of the left kidney measuring up to 2.4 cm. Bladder is unremarkable. Stomach/Bowel: Stomach is within normal limits. Appendix not clearly identified, however no inflammatory changes in the pericecal region. No evidence of bowel wall thickening, distention, or inflammatory changes. Vascular/Lymphatic: Mild aortic atherosclerosis. No enlarged abdominal or pelvic lymph  nodes. Reproductive: Intrauterine contraceptive device in place. Uterus and adnexa are otherwise unremarkable. Other: Small fat containing umbilical hernia. No abdominopelvic ascites. Musculoskeletal: Degenerate disc disease of the lumbar spine prominent at L5-S1 with vacuum disc phenomena. No acute osseous abnormality. IMPRESSION: 1. No CT evidence of acute abdominal/pelvic process. 2. No evidence of nephrolithiasis or hydronephrosis. 3. Intrauterine contraceptive device in place. 4. Degenerate disc disease of the lumbar spine prominent at L5-S1 with vacuum disc phenomena. 5. Aortic atherosclerosis. Electronically Signed   By: Keane Police D.O.   On: 07/20/2022 13:40     Labs:   Basic Metabolic Panel: Recent Labs  Lab 07/21/22 0253 07/21/22 0857 07/21/22 1234 07/22/22 0329 07/23/22 0340  NA 138 139 138 135 139  K 4.2 3.7 3.6 3.9 3.4*  CL 104 107 105 104 105  CO2 '23 24 24 23 25  '$ GLUCOSE 224* 193* 201* 360* 168*  BUN 41* 34* 30* 24* 18  CREATININE 1.16* 1.09* 1.03* 1.04* 0.83  CALCIUM 8.7* 8.8* 8.7* 8.3* 8.6*  MG  --   --   --  2.0  --    GFR Estimated Creatinine Clearance: 93 mL/min (by C-G formula based on SCr of 0.83 mg/dL). Liver Function Tests: Recent Labs  Lab 07/20/22 1120  AST 16  ALT 33  ALKPHOS 184*  BILITOT 1.0  PROT 7.8  ALBUMIN 4.8   Recent Labs  Lab 07/20/22  1120  LIPASE 20   No results for input(s): "AMMONIA" in the last 168 hours. Coagulation profile No results for input(s): "INR", "PROTIME" in the last 168 hours.  CBC: Recent Labs  Lab 07/20/22 1120 07/20/22 1237 07/20/22 1610 07/22/22 0329  WBC 29.1*  --   --  7.5  HGB 14.6 16.3* 13.6 11.7*  HCT 48.7* 48.0* 40.0 35.4*  MCV 95.5  --   --  87.2  PLT 405*  --   --  177   Cardiac Enzymes: No results for input(s): "CKTOTAL", "CKMB", "CKMBINDEX", "TROPONINI" in the last 168 hours. BNP: Invalid input(s): "POCBNP" CBG: Recent Labs  Lab 07/22/22 0736 07/22/22 1619 07/22/22 2221  07/23/22 0748 07/23/22 1114  GLUCAP 305* 277* 105* 265* 397*   D-Dimer No results for input(s): "DDIMER" in the last 72 hours. Hgb A1c Recent Labs    07/21/22 1430  HGBA1C 9.9*   Lipid Profile No results for input(s): "CHOL", "HDL", "LDLCALC", "TRIG", "CHOLHDL", "LDLDIRECT" in the last 72 hours. Thyroid function studies No results for input(s): "TSH", "T4TOTAL", "T3FREE", "THYROIDAB" in the last 72 hours.  Invalid input(s): "FREET3" Anemia work up No results for input(s): "VITAMINB12", "FOLATE", "FERRITIN", "TIBC", "IRON", "RETICCTPCT" in the last 72 hours. Microbiology Recent Results (from the past 240 hour(s))  Resp panel by RT-PCR (RSV, Flu A&B, Covid) Anterior Nasal Swab     Status: None   Collection Time: 07/20/22 11:20 AM   Specimen: Anterior Nasal Swab  Result Value Ref Range Status   SARS Coronavirus 2 by RT PCR NEGATIVE NEGATIVE Final    Comment: (NOTE) SARS-CoV-2 target nucleic acids are NOT DETECTED.  The SARS-CoV-2 RNA is generally detectable in upper respiratory specimens during the acute phase of infection. The lowest concentration of SARS-CoV-2 viral copies this assay can detect is 138 copies/mL. A negative result does not preclude SARS-Cov-2 infection and should not be used as the sole basis for treatment or other patient management decisions. A negative result may occur with  improper specimen collection/handling, submission of specimen other than nasopharyngeal swab, presence of viral mutation(s) within the areas targeted by this assay, and inadequate number of viral copies(<138 copies/mL). A negative result must be combined with clinical observations, patient history, and epidemiological information. The expected result is Negative.  Fact Sheet for Patients:  EntrepreneurPulse.com.au  Fact Sheet for Healthcare Providers:  IncredibleEmployment.be  This test is no t yet approved or cleared by the [place] FDA  and  has been authorized for detection and/or diagnosis of SARS-CoV-2 by FDA under an Emergency Use Authorization (EUA). This EUA will remain  in effect (meaning this test can be used) for the duration of the COVID-19 declaration under Section 564(b)(1) of the Act, 21 U.S.C.section 360bbb-3(b)(1), unless the authorization is terminated  or revoked sooner.       Influenza A by PCR NEGATIVE NEGATIVE Final   Influenza B by PCR NEGATIVE NEGATIVE Final    Comment: (NOTE) The Xpert Xpress SARS-CoV-2/FLU/RSV plus assay is intended as an aid in the diagnosis of influenza from Nasopharyngeal swab specimens and should not be used as a sole basis for treatment. Nasal washings and aspirates are unacceptable for Xpert Xpress SARS-CoV-2/FLU/RSV testing.  Fact Sheet for Patients: EntrepreneurPulse.com.au  Fact Sheet for Healthcare Providers: IncredibleEmployment.be  This test is not yet approved or cleared by the [place] FDA and has been authorized for detection and/or diagnosis of SARS-CoV-2 by FDA under an Emergency Use Authorization (EUA). This EUA will remain in effect (meaning this test can  be used) for the duration of the COVID-19 declaration under Section 564(b)(1) of the Act, 21 U.S.C. section 360bbb-3(b)(1), unless the authorization is terminated or revoked.     Resp Syncytial Virus by PCR NEGATIVE NEGATIVE Final    Comment: (NOTE) Fact Sheet for Patients: EntrepreneurPulse.com.au  Fact Sheet for Healthcare Providers: IncredibleEmployment.be  This test is not yet approved or cleared by the [place] FDA and has been authorized for detection and/or diagnosis of SARS-CoV-2 by FDA under an Emergency Use Authorization (EUA). This EUA will remain in effect (meaning this test can be used) for the duration of the COVID-19 declaration under Section 564(b)(1) of the Act, 21 U.S.C. section 360bbb-3(b)(1),  unless the authorization is terminated or revoked.  Performed at [facility], [address], [place], [place] [address]   MRSA Next Gen by PCR, Nasal     Status: None   Collection Time: 07/20/22  9:04 PM   Specimen: Nasal Mucosa; Nasal Swab  Result Value Ref Range Status   MRSA by PCR Next Gen NOT DETECTED NOT DETECTED Final    Comment: (NOTE) The GeneXpert MRSA Assay (FDA approved for NASAL specimens only), is one component of a comprehensive MRSA colonization surveillance program. It is not intended to diagnose MRSA infection nor to guide or monitor treatment for MRSA infections. Test performance is not FDA approved in patients less than 60 years old. Performed at [facility], [place] [address], [place], [place] [address]      Discharge Instructions:   Discharge Instructions     Diet Carb Modified   Complete by: As directed    Discharge instructions   Complete by: As directed    Follow up with your primary care physician in one week. Do not use insulin pump. Use long acting, meal time and correction insulin as prescribed. Follow up with your endocrinology as outpatient as soon as possible to discuss about insulin doses, pump.  Seek medical attention for worsening symptoms.   Increase activity slowly   Complete by: As directed       Allergies as of 07/23/2022   No Known Allergies      Medication List     STOP taking these medications    NovoLOG 100 UNIT/ML injection Generic drug: insulin aspart Replaced by: NovoLOG FlexPen 100 UNIT/ML FlexPen       TAKE these medications    Alcohol Prep 70 % Pads USE TO TEST BLOOD SUGAR FOUR TIMES DAILY AS DIRECTED   atorvastatin 80 MG tablet Commonly known as: LIPITOR Take 1 tablet (80 mg total) by mouth daily.   BERBERINE CHLORIDE PO Take 510 mg by mouth 3 (three) times daily.   clonazePAM 1 MG tablet Commonly known as: KLONOPIN TAKE 1 TABLET BY MOUTH THREE TIMES A DAY AS  NEEDED FOR ANXIETY   ezetimibe 10 MG tablet Commonly known as: ZETIA Take 1 tablet (10 mg total) by mouth daily.   FreeStyle Libre 2 Sensor Misc USE AS DIRECTED EVERY 14 DAYS   Glucagon 3 MG/DOSE Powd Place 3 mg into the nose once as needed for up to 1 dose.   insulin glargine 100 UNIT/ML Solostar Pen Commonly known as: LANTUS Inject 38 Units into the skin daily.   Insulin Pen Needle 32G X 8 MM Misc Use as directed   metFORMIN 1000 MG tablet Commonly known as: GLUCOPHAGE TAKE 1 TABLET (1,000 MG TOTAL) BY MOUTH TWICE A DAY WITH FOOD   NovoLOG FlexPen 100 UNIT/ML FlexPen Generic drug: insulin aspart  Inject 5 Units into the skin 3 (three) times daily with meals. Replaces: NovoLOG 100 UNIT/ML injection   insulin aspart 100 UNIT/ML FlexPen Commonly known as: NOVOLOG TID with meals CBG 70 - 120: 0 units CBG 121 - 150: 0 units CBG 151 - 200: 0 units CBG 201 - 250: 2 units CBG 251 - 300: 3 units CBG 301 - 350: 4 units CBG 351 - 400: 5 units CBG > 400   venlafaxine XR 150 MG 24 hr capsule Commonly known as: EFFEXOR-XR Take 2 capsules (300 mg total) by mouth daily.        Follow-up Information     [facility] Follow up.   Why: Call [place] to see if you are eligible for food assistance. Contact information: [phone]                 Time coordinating discharge: 39 minutes  Signed:  Tasheka Houseman  [facility] 07/23/2022, 1:00 PM

## 2022-07-23 NOTE — Plan of Care (Signed)
  Problem: Education: Goal: Knowledge of General Education information will improve Description: Including pain rating scale, medication(s)/side effects and non-pharmacologic comfort measures Outcome: Completed/Met   Problem: Health Behavior/Discharge Planning: Goal: Ability to manage health-related needs will improve Outcome: Completed/Met   Problem: Clinical Measurements: Goal: Ability to maintain clinical measurements within normal limits will improve Outcome: Completed/Met Goal: Will remain free from infection Outcome: Completed/Met Goal: Diagnostic test results will improve Outcome: Completed/Met Goal: Respiratory complications will improve Outcome: Completed/Met Goal: Cardiovascular complication will be avoided Outcome: Completed/Met   Problem: Activity: Goal: Risk for activity intolerance will decrease Outcome: Completed/Met   Problem: Nutrition: Goal: Adequate nutrition will be maintained Outcome: Completed/Met   Problem: Coping: Goal: Level of anxiety will decrease Outcome: Completed/Met   Problem: Elimination: Goal: Will not experience complications related to bowel motility Outcome: Completed/Met Goal: Will not experience complications related to urinary retention Outcome: Completed/Met   Problem: Pain Managment: Goal: General experience of comfort will improve Outcome: Completed/Met   Problem: Safety: Goal: Ability to remain free from injury will improve Outcome: Completed/Met   Problem: Skin Integrity: Goal: Risk for impaired skin integrity will decrease Outcome: Completed/Met   Problem: Education: Goal: Ability to describe self-care measures that may prevent or decrease complications (Diabetes Survival Skills Education) will improve Outcome: Completed/Met Goal: Individualized Educational Video(s) Outcome: Completed/Met   Problem: Coping: Goal: Ability to adjust to condition or change in health will improve Outcome: Completed/Met   Problem:  Fluid Volume: Goal: Ability to maintain a balanced intake and output will improve Outcome: Completed/Met   Problem: Health Behavior/Discharge Planning: Goal: Ability to identify and utilize available resources and services will improve Outcome: Completed/Met Goal: Ability to manage health-related needs will improve Outcome: Completed/Met   Problem: Metabolic: Goal: Ability to maintain appropriate glucose levels will improve Outcome: Completed/Met   Problem: Nutritional: Goal: Maintenance of adequate nutrition will improve Outcome: Completed/Met Goal: Progress toward achieving an optimal weight will improve Outcome: Completed/Met   Problem: Skin Integrity: Goal: Risk for impaired skin integrity will decrease Outcome: Completed/Met   Problem: Tissue Perfusion: Goal: Adequacy of tissue perfusion will improve Outcome: Completed/Met   Problem: Education: Goal: Ability to describe self-care measures that may prevent or decrease complications (Diabetes Survival Skills Education) will improve Outcome: Completed/Met Goal: Individualized Educational Video(s) Outcome: Completed/Met   Problem: Cardiac: Goal: Ability to maintain an adequate cardiac output will improve Outcome: Completed/Met   Problem: Health Behavior/Discharge Planning: Goal: Ability to identify and utilize available resources and services will improve Outcome: Completed/Met Goal: Ability to manage health-related needs will improve Outcome: Completed/Met   Problem: Fluid Volume: Goal: Ability to achieve a balanced intake and output will improve Outcome: Completed/Met   Problem: Metabolic: Goal: Ability to maintain appropriate glucose levels will improve Outcome: Completed/Met   Problem: Nutritional: Goal: Maintenance of adequate nutrition will improve Outcome: Completed/Met Goal: Maintenance of adequate weight for body size and type will improve Outcome: Completed/Met   Problem: Respiratory: Goal: Will  regain and/or maintain adequate ventilation Outcome: Completed/Met   Problem: Urinary Elimination: Goal: Ability to achieve and maintain adequate renal perfusion and functioning will improve Outcome: Completed/Met

## 2022-07-23 NOTE — TOC Transition Note (Signed)
Transition of Care [facility]) - CM/SW Discharge Note  Patient Details  Name: Kristie [name] MRN: [id number] Date of Birth: 09-28-1965  [facility]) CM/SW Contact:  Sherie Don, LCSW Phone Number: 07/23/2022, 12:45 PM  Clinical Narrative: Patient had food insecurity concerns per SDOH screening. Information for [employer] added to AVS. TOC signing off.  Final next level of care: Home/Self Care Barriers to Discharge: No Barriers Identified  Discharge Plan and Services Additional resources added to the After Visit Summary for: [facility]      DME Arranged: N/A DME Agency: NA  Social Determinants of Health (SDOH) Interventions [place]: Food Insecurity Present (07/20/2022)  Housing: Medium Risk (07/20/2022)  Transportation Needs: No Transportation Needs (07/20/2022)  Utilities: Not At Risk (07/20/2022)  Alcohol Screen: Low Risk  (02/15/2021)  Depression (PHQ2-9): Medium Risk (06/25/2022)  Financial Resource Strain: Low Risk  (02/21/2022)  Physical Activity: Inactive (02/21/2022)  Social Connections: Moderately Integrated (02/21/2022)  Stress: No Stress Concern Present (02/21/2022)  Tobacco Use: Low Risk  (07/20/2022)   Readmission Risk Interventions     No data to display

## 2022-07-24 ENCOUNTER — Encounter: Payer: Self-pay | Admitting: Family Medicine

## 2022-07-24 ENCOUNTER — Telehealth: Payer: Self-pay

## 2022-07-24 NOTE — Patient Outreach (Signed)
  Care Coordination TOC Note Transition Care Management Unsuccessful Follow-up Telephone Call  Date of discharge and from where:  07/23/22-[facility]  Attempts:  1st Attempt  Reason for unsuccessful TCM follow-up call:  Unable to leave message     Enzo Montgomery, RN,BSN,CCM [place] Management Telephonic Care Management Coordinator Direct Phone: [phone] Toll Free: [phone] Fax: [phone]

## 2022-07-25 ENCOUNTER — Telehealth: Payer: Self-pay

## 2022-07-25 ENCOUNTER — Ambulatory Visit (INDEPENDENT_AMBULATORY_CARE_PROVIDER_SITE_OTHER): Payer: PPO | Admitting: Family Medicine

## 2022-07-25 ENCOUNTER — Encounter: Payer: Self-pay | Admitting: Family Medicine

## 2022-07-25 VITALS — BP 109/79 | HR 102 | Temp 98.1°F | Ht 67.0 in | Wt 224.2 lb

## 2022-07-25 DIAGNOSIS — E1165 Type 2 diabetes mellitus with hyperglycemia: Secondary | ICD-10-CM | POA: Diagnosis not present

## 2022-07-25 DIAGNOSIS — Z794 Long term (current) use of insulin: Secondary | ICD-10-CM | POA: Diagnosis not present

## 2022-07-25 DIAGNOSIS — E1010 Type 1 diabetes mellitus with ketoacidosis without coma: Secondary | ICD-10-CM | POA: Diagnosis not present

## 2022-07-25 DIAGNOSIS — T85694S Other mechanical complication of insulin pump, sequela: Secondary | ICD-10-CM

## 2022-07-25 LAB — CBC WITH DIFFERENTIAL/PLATELET
Basophils Absolute: 0 10*3/uL (ref 0.0–0.1)
Basophils Relative: 0.6 % (ref 0.0–3.0)
Eosinophils Absolute: 0.1 10*3/uL (ref 0.0–0.7)
Eosinophils Relative: 1.1 % (ref 0.0–5.0)
HCT: 37.2 % (ref 36.0–46.0)
Hemoglobin: 12.3 g/dL (ref 12.0–15.0)
Lymphocytes Relative: 24.8 % (ref 12.0–46.0)
Lymphs Abs: 1.5 10*3/uL (ref 0.7–4.0)
MCHC: 33.1 g/dL (ref 30.0–36.0)
MCV: 88.1 fl (ref 78.0–100.0)
Monocytes Absolute: 0.5 10*3/uL (ref 0.1–1.0)
Monocytes Relative: 7.8 % (ref 3.0–12.0)
Neutro Abs: 4 10*3/uL (ref 1.4–7.7)
Neutrophils Relative %: 65.7 % (ref 43.0–77.0)
Platelets: 229 10*3/uL (ref 150.0–400.0)
RBC: 4.23 Mil/uL (ref 3.87–5.11)
RDW: 14.1 % (ref 11.5–15.5)
WBC: 6.1 10*3/uL (ref 4.0–10.5)

## 2022-07-25 LAB — BASIC METABOLIC PANEL
BUN: 21 mg/dL (ref 6–23)
CO2: 25 mEq/L (ref 19–32)
Calcium: 8.2 mg/dL — ABNORMAL LOW (ref 8.4–10.5)
Chloride: 102 mEq/L (ref 96–112)
Creatinine, Ser: 0.9 mg/dL (ref 0.40–1.20)
GFR: 71.41 mL/min (ref >60.00)
Glucose, Bld: 431 mg/dL — ABNORMAL HIGH (ref 70–99)
Potassium: 4.2 mEq/L (ref 3.5–5.1)
Sodium: 138 mEq/L (ref 135–145)

## 2022-07-25 LAB — MAGNESIUM: Magnesium: 1.7 mg/dL (ref 1.5–2.5)

## 2022-07-25 NOTE — Patient Instructions (Signed)
Gradually increase your Lantus by 1 to 2 units every day until your fasting glucose is in the 100-110 range.  Take 5 units of NovoLog with each meal.  You may have to add some additional units based on what your sugar is at the time of your meal.  Use the following scale: Glucose 70-150, no additional insulin. Glucose 151-200, give 2 additional units. Glucose 201-300.  Give 3 additional units. Glucose 300-400, give 4 additional units. Glucose greater than 400, give 5 additional units.

## 2022-07-25 NOTE — Patient Outreach (Signed)
  Care Coordination TOC Note Transition Care Management Unsuccessful Follow-up Telephone Call  Date of discharge and from where:  07/23/22-[facility]  Attempts:  2nd Attempt  Reason for unsuccessful TCM follow-up call:  Unable to leave message     Enzo Montgomery, RN,BSN,CCM [place] Management Telephonic Care Management Coordinator Direct Phone: [phone] Toll Free: [phone] Fax: [phone]
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
## 2022-07-25 NOTE — Patient Outreach (Signed)
  Care Coordination TOC Note Transition Care Management Unsuccessful Follow-up Telephone Call  Date of discharge and from where:  07/23/22-[facility]   Attempts:  3rd Attempt  Reason for unsuccessful TCM follow-up call:  Unable to reach patient    Kristie [name] Management Telephonic Care Management Coordinator Direct Phone: [phone] Toll Free: [phone] Fax: [phone]

## 2022-08-06 DIAGNOSIS — E1065 Type 1 diabetes mellitus with hyperglycemia: Secondary | ICD-10-CM | POA: Diagnosis not present

## 2022-08-07 ENCOUNTER — Encounter: Payer: Self-pay | Admitting: Family Medicine

## 2022-08-07 ENCOUNTER — Ambulatory Visit (INDEPENDENT_AMBULATORY_CARE_PROVIDER_SITE_OTHER): Payer: PPO | Admitting: Family Medicine

## 2022-08-07 VITALS — BP 119/76 | HR 78 | Temp 98.0°F | Ht 67.0 in | Wt 223.8 lb

## 2022-08-07 DIAGNOSIS — E10319 Type 1 diabetes mellitus with unspecified diabetic retinopathy without macular edema: Secondary | ICD-10-CM | POA: Diagnosis not present

## 2022-08-07 MED ORDER — FREESTYLE LIBRE 3 SENSOR MISC: 0 refills | Status: DC
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
## 2022-08-10 ENCOUNTER — Ambulatory Visit: Payer: HMO | Admitting: Internal Medicine

## 2022-08-23 LAB — HM DIABETES EYE EXAM

## 2022-09-04 ENCOUNTER — Ambulatory Visit
Admission: RE | Admit: 2022-09-04 | Discharge: 2022-09-04 | Disposition: A | Discharge status: Home / Self Care | Payer: PPO | Source: Ambulatory Visit | Attending: Family Medicine | Admitting: Family Medicine

## 2022-09-04 DIAGNOSIS — Z1231 Encounter for screening mammogram for malignant neoplasm of breast: Secondary | ICD-10-CM | POA: Diagnosis not present

## 2022-09-12 ENCOUNTER — Other Ambulatory Visit: Payer: Self-pay | Admitting: Family Medicine

## 2022-09-13 NOTE — Telephone Encounter (Signed)
Requesting: clonazepam Contract: 05/09/20 UDS: 05/18/21 Last Visit: 08/07/22 Next Visit: 12/25/22 Last Refill: 04/04/22 (90,1)  Please Advise. Med pending

## 2022-10-04 ENCOUNTER — Telehealth: Payer: Self-pay

## 2022-10-04 NOTE — Telephone Encounter (Signed)
Type of forms received:  [facility] Medical Report Form  Routed to:  [employer] [facility]  Paperwork received by :  Hinton Dyer   Individual made aware of 5-7 business day turn around (Y/N):  Y  Form completed and patient made aware of charges(Y/N):  N/A  (was not sure of charge amount)   Form location:   [facility] inbox front office   **I tried calling patient back, in review of form, dated 09/05/22; [name] stated medical report form had to be received by their office within 30 days of the date of letter. If not, driver's license will be cancelled until they receive medical report.  I was calling to make patient aware. I attempted to contact [name] 2 times this afternoon, and there was not a voicemail option to leave a message.

## 2022-10-04 NOTE — Telephone Encounter (Signed)
Pt had cpe 06/25/22.  Placed on PCP desk for review and completion.

## 2022-10-05 DIAGNOSIS — Z0279 Encounter for issue of other medical certificate: Secondary | ICD-10-CM

## 2022-10-09 NOTE — Telephone Encounter (Signed)
Kristie [name] called to see if the forms have been completed. I advised her of the last update in the system and that the forms are on the providers desk. She is aware that the forms are late and she did get and extension from the [facility] until Friday, so she is hoping the forms can be completed tomorrow at the latest. Please give the patient a call for any status changes.

## 2022-10-10 NOTE — Telephone Encounter (Signed)
Spoke with husband Tharon Aquas (DPR), patient was not at home at the time of my call to [place].  Forms for DMV are completed and ready for pick up.

## 2022-10-10 NOTE — Telephone Encounter (Signed)
Noted  

## 2022-10-18 ENCOUNTER — Ambulatory Visit (INDEPENDENT_AMBULATORY_CARE_PROVIDER_SITE_OTHER): Payer: Medicare PPO | Admitting: Obstetrics & Gynecology

## 2022-10-18 ENCOUNTER — Encounter: Payer: Self-pay | Admitting: Obstetrics & Gynecology

## 2022-10-18 ENCOUNTER — Other Ambulatory Visit (HOSPITAL_COMMUNITY)
Admission: RE | Admit: 2022-10-18 | Discharge: 2022-10-18 | Disposition: A | Discharge status: Home / Self Care | Payer: Medicare PPO | Source: Ambulatory Visit | Attending: Obstetrics & Gynecology | Admitting: Obstetrics & Gynecology

## 2022-10-18 VITALS — BP 140/86 | HR 106 | Ht 67.0 in | Wt 232.2 lb

## 2022-10-18 DIAGNOSIS — Z01419 Encounter for gynecological examination (general) (routine) without abnormal findings: Secondary | ICD-10-CM | POA: Diagnosis not present

## 2022-10-18 DIAGNOSIS — Z30432 Encounter for removal of intrauterine contraceptive device: Secondary | ICD-10-CM

## 2022-10-18 DIAGNOSIS — Z1151 Encounter for screening for human papillomavirus (HPV): Secondary | ICD-10-CM | POA: Insufficient documentation

## 2022-10-18 DIAGNOSIS — Z780 Asymptomatic menopausal state: Secondary | ICD-10-CM | POA: Diagnosis not present
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
## 2022-10-23 LAB — CYTOLOGY - PAP
Comment: NEGATIVE
Diagnosis: NEGATIVE
High risk HPV: NEGATIVE

## 2022-11-05 DIAGNOSIS — E1065 Type 1 diabetes mellitus with hyperglycemia: Secondary | ICD-10-CM | POA: Diagnosis not present

## 2022-11-05 LAB — HEMOGLOBIN A1C: Hemoglobin A1C: 8.4

## 2022-11-15 ENCOUNTER — Other Ambulatory Visit: Payer: Self-pay | Admitting: Family Medicine

## 2022-11-27 DIAGNOSIS — E1065 Type 1 diabetes mellitus with hyperglycemia: Secondary | ICD-10-CM | POA: Diagnosis not present

## 2022-12-08 ENCOUNTER — Other Ambulatory Visit: Payer: Self-pay | Admitting: Family Medicine

## 2022-12-12 ENCOUNTER — Other Ambulatory Visit: Payer: Self-pay | Admitting: Family Medicine

## 2022-12-20 ENCOUNTER — Ambulatory Visit (INDEPENDENT_AMBULATORY_CARE_PROVIDER_SITE_OTHER)
Admission: RE | Admit: 2022-12-20 | Discharge: 2022-12-20 | Disposition: A | Discharge status: Home / Self Care | Payer: Medicare PPO | Source: Ambulatory Visit | Attending: Family Medicine | Admitting: Family Medicine

## 2022-12-20 ENCOUNTER — Encounter: Payer: Self-pay | Admitting: Family Medicine

## 2022-12-20 ENCOUNTER — Ambulatory Visit (INDEPENDENT_AMBULATORY_CARE_PROVIDER_SITE_OTHER): Payer: Medicare PPO | Admitting: Family Medicine

## 2022-12-20 VITALS — BP 126/70 | HR 108 | Temp 98.1°F | Ht 67.0 in | Wt 234.6 lb

## 2022-12-20 DIAGNOSIS — J22 Unspecified acute lower respiratory infection: Secondary | ICD-10-CM

## 2022-12-20 DIAGNOSIS — R051 Acute cough: Secondary | ICD-10-CM

## 2022-12-20 DIAGNOSIS — R0600 Dyspnea, unspecified: Secondary | ICD-10-CM | POA: Diagnosis not present

## 2022-12-20 DIAGNOSIS — J040 Acute laryngitis: Secondary | ICD-10-CM

## 2022-12-20 DIAGNOSIS — R059 Cough, unspecified: Secondary | ICD-10-CM | POA: Diagnosis not present

## 2022-12-20 MED ORDER — AMOXICILLIN-POT CLAVULANATE 875-125 MG PO TABS: 1.0000 | ORAL_TABLET | Freq: Two times a day (BID) | ORAL | 0 refills | Status: DC

## 2022-12-20 MED ORDER — BENZONATATE 100 MG PO CAPS: 100.0000 mg | ORAL_CAPSULE | Freq: Three times a day (TID) | ORAL | 0 refills | Status: DC | PRN

## 2022-12-20 NOTE — Patient Instructions (Addendum)
Voice rest as we discussed, continue to drink fluids.  Symptoms likely due to a virus initially but with worsening cough and shortness of breath start antibiotic and have chest x-ray performed today.  If concerns on x-ray I will let you know.  Tessalon Perles as needed for cough, Mucinex over-the-counter may also help cough more productive and to help clear congestion.  Continue to use saline nasal spray or washes for nasal congestion.  Monitor blood sugars and coordinate any changes with endocrinology if running high. Hope you feel better soon!  [facility] or xray: Walk in 8:30-4:30 during weekdays, no appointment needed [address]  [place], [place] [address]  Laryngitis  Laryngitis is inflammation of the vocal cords that causes symptoms such as hoarseness or loss of voice. The vocal cords are two bands of muscles in your throat. When you speak, these cords come together and vibrate. The vibrations come out through your mouth as sound. When your vocal cords are inflamed, your voice sounds different. Laryngitis can be temporary (acute) or long-term (chronic). Most cases of acute laryngitis improve with time. Chronic laryngitis is laryngitis that lasts for more than 3 weeks. What are the causes? Acute laryngitis may be caused by: A viral infection. Lots of talking, yelling, or singing. This is also called vocal strain. A bacterial infection. Chronic laryngitis may be caused by: Vocal strain or an injury to the vocal cords. Acid reflux (gastroesophageal reflux disease, or GERD). Allergies, a sinus infection, or postnasal drip. Smoking. Excessive alcohol use. Breathing in chemicals or dust. Growths on the vocal cords. What increases the risk? The following factors may make you more likely to develop this condition: Smoking. Alcohol abuse. Having allergies. Chronic irritants in the workplace, such as toxic fumes. What are the signs or symptoms? Symptoms of this condition may  include: Low, hoarse voice. Loss of voice. Dry cough. Sore or dry throat. Stuffy or congested nose. How is this diagnosed? This condition may be diagnosed based on: Your symptoms and a physical exam. Throat culture. Blood test. A procedure in which your health care provider looks at your vocal cords with a mirror or viewing tube (laryngoscopy). How is this treated? Treatment for laryngitis depends on what is causing it. Usually, treatment involves resting your voice and using medicines to soothe your throat. If your laryngitis is caused by a bacterial infection, you may need to take antibiotic medicine. If your laryngitis is caused by a growth, you may need to have a procedure to remove it. Follow these instructions at home: Medicines Take over-the-counter and prescription medicines only as told by your health care provider. If you were prescribed an antibiotic medicine, take it as told by your health care provider. Do not stop taking the antibiotic even if you start to feel better. Use throat lozenges or sprays to soothe your throat as told by your health care provider. General instructions  Talk as little as possible. To do this: Write instead of talking. Do this until your voice is back to normal. Avoid whispering, which can cause vocal strain. Gargle with a mixture of salt and water 3-4 times a day or as needed. To make salt water, completely dissolve -1 tsp (3-6 g) of salt in 1 cup (237 mL) of warm water. Drink enough fluid to keep your urine pale yellow. Breathe in moist air. Use a humidifier if you live in a dry climate. Do not use any products that contain nicotine or tobacco. These products include cigarettes, chewing  tobacco, and vaping devices, such as e-cigarettes. If you need help quitting, ask your health care provider. Contact a health care provider if: You have a fever. You have increasing pain. Your symptoms do not get better in 2 weeks. Get help right away  if: You cough up blood. You have difficulty swallowing. You have trouble breathing. Summary Laryngitis is inflammation of the vocal cords that causes symptoms such as hoarseness or loss of voice. Laryngitis can be temporary or long-term. Treatment for laryngitis depends on the cause. It often involves resting your voice and using medicine to soothe your throat. Get help right away if you have difficulty swallowing or breathing or if you cough up blood. This information is not intended to replace advice given to you by your health care provider. Make sure you discuss any questions you have with your health care provider. Document Revised: 09/19/2020 Document Reviewed: 09/19/2020 Elsevier Patient Education  2024 [employer]   Cough, Adult Coughing is a reflex that clears your throat and airways (respiratory system). It helps heal and protect your lungs. It is normal to cough from time to time. A cough that happens with other symptoms or that lasts a long time may be a sign of a condition that needs treatment. A short-term (acute) cough may only last 2-3 weeks. A long-term (chronic) cough may last 8 or more weeks. Coughing is often caused by: Diseases, such as: An infection of the respiratory system. Asthma or other heart or lung diseases. Gastroesophageal reflux. This is when acid comes back up from the stomach. Breathing in things that irritate your lungs. Allergies. Postnasal drip. This is when mucus runs down the back of your throat. Smoking. Some medicines. Follow these instructions at home: Medicines Take over-the-counter and prescription medicines only as told by your health care provider. Talk with your provider before you take cough medicine (cough suppressants). Eating and drinking Do not drink alcohol. Avoid caffeine. Drink enough fluid to keep your pee (urine) pale yellow. Lifestyle Avoid cigarette smoke. Do not use any products that contain nicotine or tobacco. These  products include cigarettes, chewing tobacco, and vaping devices, such as e-cigarettes. If you need help quitting, ask your provider. Avoid things that make you cough. These may include perfumes, candles, cleaning products, or campfire smoke. General instructions  Watch for any changes to your cough. Tell your provider about them. Always cover your mouth when you cough. If the air is dry in your bedroom or home, use a cool mist vaporizer or humidifier. If your cough is worse at night, try to sleep in a semi-upright position. Rest as needed. Contact a health care provider if: You have new symptoms, or your symptoms get worse. You cough up pus. You have a fever that does not go away or a cough that does not get better after 2-3 weeks. You cannot control your cough with medicine, and you are losing sleep. You have pain that gets worse or is not helped with medicine. You lose weight for no clear reason. You have night sweats. Get help right away if: You cough up blood. You have trouble breathing. Your heart is beating very fast. These symptoms may be an emergency. Get help right away. Call 911. Do not wait to see if the symptoms will go away. Do not drive yourself to the hospital. This information is not intended to replace advice given to you by your health care provider. Make sure you discuss any questions you have with your health care provider.  Document Revised: 03/02/2022 Document Reviewed: 03/02/2022 Elsevier Patient Education  2024 [employer].
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
## 2022-12-25 ENCOUNTER — Encounter: Payer: Self-pay | Admitting: Family Medicine

## 2022-12-25 ENCOUNTER — Ambulatory Visit (INDEPENDENT_AMBULATORY_CARE_PROVIDER_SITE_OTHER): Payer: Medicare PPO | Admitting: Family Medicine

## 2022-12-25 VITALS — BP 108/77 | HR 90 | Wt 234.8 lb

## 2022-12-25 DIAGNOSIS — Z79899 Other long term (current) drug therapy: Secondary | ICD-10-CM

## 2022-12-25 DIAGNOSIS — F411 Generalized anxiety disorder: Secondary | ICD-10-CM | POA: Diagnosis not present

## 2022-12-25 DIAGNOSIS — E7800 Pure hypercholesterolemia, unspecified: Secondary | ICD-10-CM

## 2022-12-25 DIAGNOSIS — R748 Abnormal levels of other serum enzymes: Secondary | ICD-10-CM | POA: Diagnosis not present

## 2022-12-25 DIAGNOSIS — F3342 Major depressive disorder, recurrent, in full remission: Secondary | ICD-10-CM

## 2022-12-25 DIAGNOSIS — R7401 Elevation of levels of liver transaminase levels: Secondary | ICD-10-CM | POA: Diagnosis not present

## 2022-12-25 LAB — COMPREHENSIVE METABOLIC PANEL
ALT: 32 U/L (ref 0–35)
AST: 38 U/L — ABNORMAL HIGH (ref 0–37)
Albumin: 3.8 g/dL (ref 3.5–5.2)
Alkaline Phosphatase: 156 U/L — ABNORMAL HIGH (ref 39–117)
BUN: 14 mg/dL (ref 6–23)
CO2: 29 mEq/L (ref 19–32)
Calcium: 8.9 mg/dL (ref 8.4–10.5)
Chloride: 102 mEq/L (ref 96–112)
Creatinine, Ser: 0.85 mg/dL (ref 0.40–1.20)
GFR: 76.25 mL/min (ref >60.00)
Glucose, Bld: 180 mg/dL — ABNORMAL HIGH (ref 70–99)
Potassium: 4.4 mEq/L (ref 3.5–5.1)
Sodium: 138 mEq/L (ref 135–145)
Total Bilirubin: 0.6 mg/dL (ref 0.2–1.2)
Total Protein: 6.6 g/dL (ref 6.0–8.3)

## 2022-12-25 LAB — LIPID PANEL
Cholesterol: 230 mg/dL — ABNORMAL HIGH (ref 0–200)
HDL: 57.7 mg/dL (ref >39.00)
LDL Cholesterol: 147 mg/dL — ABNORMAL HIGH (ref 0–99)
NonHDL: 172.5
Total CHOL/HDL Ratio: 4
Triglycerides: 129 mg/dL (ref 0.0–149.0)
VLDL: 25.8 mg/dL (ref 0.0–40.0)

## 2022-12-25 LAB — GAMMA GT: GGT: 78 U/L — ABNORMAL HIGH (ref 7–51)

## 2022-12-25 MED ORDER — VENLAFAXINE HCL ER 150 MG PO CP24: 300.0000 mg | ORAL_CAPSULE | Freq: Every day | ORAL | 1 refills | Status: DC
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
## 2022-12-26 LAB — HEPATITIS C ANTIBODY: Hepatitis C Ab: NONREACTIVE

## 2022-12-26 LAB — HEPATITIS B SURFACE ANTIGEN: Hepatitis B Surface Ag: NONREACTIVE

## 2022-12-27 LAB — DRUG MONITORING PANEL 376104, URINE
Alphahydroxyalprazolam: NEGATIVE ng/mL (ref <25)
Alphahydroxymidazolam: NEGATIVE ng/mL (ref <50)
Alphahydroxytriazolam: NEGATIVE ng/mL (ref <50)
Aminoclonazepam: 386 ng/mL — ABNORMAL HIGH (ref <25)
Amphetamines: NEGATIVE ng/mL (ref <500)
Barbiturates: NEGATIVE ng/mL (ref <300)
Benzodiazepines: POSITIVE ng/mL — AB (ref <100)
Cocaine Metabolite: NEGATIVE ng/mL (ref <150)
Desmethyltramadol: NEGATIVE ng/mL (ref <100)
Hydroxyethylflurazepam: NEGATIVE ng/mL (ref <50)
Lorazepam: NEGATIVE ng/mL (ref <50)
Nordiazepam: NEGATIVE ng/mL (ref <50)
Opiates: NEGATIVE ng/mL (ref <100)
Oxazepam: NEGATIVE ng/mL (ref <50)
Oxycodone: NEGATIVE ng/mL (ref <100)
Temazepam: NEGATIVE ng/mL (ref <50)
Tramadol: NEGATIVE ng/mL (ref <100)

## 2022-12-27 LAB — DM TEMPLATE

## 2023-02-04 DIAGNOSIS — E1065 Type 1 diabetes mellitus with hyperglycemia: Secondary | ICD-10-CM | POA: Diagnosis not present

## 2023-02-04 DIAGNOSIS — Z9641 Presence of insulin pump (external) (internal): Secondary | ICD-10-CM | POA: Diagnosis not present

## 2023-02-04 DIAGNOSIS — Z978 Presence of other specified devices: Secondary | ICD-10-CM | POA: Diagnosis not present

## 2023-02-05 ENCOUNTER — Ambulatory Visit: Payer: PPO | Admitting: Family Medicine

## 2023-02-13 ENCOUNTER — Encounter (INDEPENDENT_AMBULATORY_CARE_PROVIDER_SITE_OTHER): Payer: Self-pay

## 2023-02-27 ENCOUNTER — Ambulatory Visit (INDEPENDENT_AMBULATORY_CARE_PROVIDER_SITE_OTHER): Payer: PPO

## 2023-02-27 VITALS — Wt 234.0 lb

## 2023-02-27 DIAGNOSIS — Z599 Problem related to housing and economic circumstances, unspecified: Secondary | ICD-10-CM | POA: Diagnosis not present

## 2023-02-27 DIAGNOSIS — Z0000 Encounter for general adult medical examination without abnormal findings: Secondary | ICD-10-CM | POA: Diagnosis not present

## 2023-02-27 NOTE — Patient Instructions (Addendum)
Ms. Cun , Thank you for taking time to come for your Medicare Wellness Visit. I appreciate your ongoing commitment to your health goals. Please review the following plan we discussed and let me know if I can assist you in the future.   Referrals/Orders/Follow-Ups/Clinician Recommendations: lose 30 lbs and placed request to aid in medication expense via [facility] per referal  This is a list of the screening recommended for you and due dates:  Health Maintenance  Topic Date Due   COVID-19 Vaccine (3 - Pfizer risk series) 01/24/2020   Medicare Annual Wellness Visit  02/25/2023   Flu Shot  02/14/2023   Yearly kidney health urinalysis for diabetes  04/06/2023   Complete foot exam   04/06/2023   Hemoglobin A1C  05/07/2023   Eye exam for diabetics  08/24/2023   Mammogram  09/05/2023   Yearly kidney function blood test for diabetes  12/25/2023   Pap Smear  10/17/2025   Colon Cancer Screening  09/22/2027   DTaP/Tdap/Td vaccine (4 - Td or Tdap) 12/14/2030   Hepatitis C Screening  Completed   HIV Screening  Completed   Zoster (Shingles) Vaccine  Completed   HPV Vaccine  Aged Out    Advanced directives: (Declined) Advance directive discussed with you today. Even though you declined this today, please call our office should you change your mind, and we can give you the proper paperwork for you to fill out.  Next Medicare Annual Wellness Visit scheduled for next year: Yes  Preventive Care 40-64 Years, Female Preventive care refers to lifestyle choices and visits with your health care provider that can promote health and wellness. What does preventive care include? A yearly physical exam. This is also called an annual well check. Dental exams once or twice a year. Routine eye exams. Ask your health care provider how often you should have your eyes checked. Personal lifestyle choices, including: Daily care of your teeth and gums. Regular physical activity. Eating a healthy diet. Avoiding  tobacco and drug use. Limiting alcohol use. Practicing safe sex. Taking low-dose aspirin daily starting at age 61. Taking vitamin and mineral supplements as recommended by your health care provider. What happens during an annual well check? The services and screenings done by your health care provider during your annual well check will depend on your age, overall health, lifestyle risk factors, and family history of disease. Counseling  Your health care provider may ask you questions about your: Alcohol use. Tobacco use. Drug use. Emotional well-being. Home and relationship well-being. Sexual activity. Eating habits. Work and work Astron[employer]. Method of birth control. Menstrual cycle. Pregnancy history. Screening  You may have the following tests or measurements: Height, weight, and BMI. Blood pressure. Lipid and cholesterol levels. These may be checked every 5 years, or more frequently if you are over 67 years old. Skin check. Lung cancer screening. You may have this screening every year starting at age 98 if you have a 30-pack-year history of smoking and currently smoke or have quit within the past 15 years. Fecal occult blood test (FOBT) of the stool. You may have this test every year starting at age 83. Flexible sigmoidoscopy or colonoscopy. You may have a sigmoidoscopy every 5 years or a colonoscopy every 10 years starting at age 95. Hepatitis C blood test. Hepatitis B blood test. Sexually transmitted disease (STD) testing. Diabetes screening. This is done by checking your blood sugar (glucose) after you have not eaten for a while (fasting). You may have this done every  1-3 years. Mammogram. This may be done every 1-2 years. Talk to your health care provider about when you should start having regular mammograms. This may depend on whether you have a family history of breast cancer. BRCA-related cancer screening. This may be done if you have a family history of breast, ovarian,  tubal, or peritoneal cancers. Pelvic exam and Pap test. This may be done every 3 years starting at age 49. Starting at age 30, this may be done every 5 years if you have a Pap test in combination with an HPV test. Bone density scan. This is done to screen for osteoporosis. You may have this scan if you are at high risk for osteoporosis. Discuss your test results, treatment options, and if necessary, the need for more tests with your health care provider. Vaccines  Your health care provider may recommend certain vaccines, such as: Influenza vaccine. This is recommended every year. Tetanus, diphtheria, and acellular pertussis (Tdap, Td) vaccine. You may need a Td booster every 10 years. Zoster vaccine. You may need this after age 106. Pneumococcal 13-valent conjugate (PCV13) vaccine. You may need this if you have certain conditions and were not previously vaccinated. Pneumococcal polysaccharide (PPSV23) vaccine. You may need one or two doses if you smoke cigarettes or if you have certain conditions. Talk to your health care provider about which screenings and vaccines you need and how often you need them. This information is not intended to replace advice given to you by your health care provider. Make sure you discuss any questions you have with your health care provider. Document Released: 07/29/2015 Document Revised: 03/21/2016 Document Reviewed: 05/03/2015 Elsevier Interactive Patient Education  2017 [employer].    Fall Prevention in the Home Falls can cause injuries. They can happen to people of all ages. There are many things you can do to make your home safe and to help prevent falls. What can I do on the outside of my home? Regularly fix the edges of walkways and driveways and fix any cracks. Remove anything that might make you trip as you walk through a door, such as a raised step or threshold. Trim any bushes or trees on the path to your home. Use bright outdoor lighting. Clear any  walking paths of anything that might make someone trip, such as rocks or tools. Regularly check to see if handrails are loose or broken. Make sure that both sides of any steps have handrails. Any raised decks and porches should have guardrails on the edges. Have any leaves, snow, or ice cleared regularly. Use sand or salt on walking paths during winter. Clean up any spills in your garage right away. This includes oil or grease spills. What can I do in the bathroom? Use night lights. Install grab bars by the toilet and in the tub and shower. Do not use towel bars as grab bars. Use non-skid mats or decals in the tub or shower. If you need to sit down in the shower, use a plastic, non-slip stool. Keep the floor dry. Clean up any water that spills on the floor as soon as it happens. Remove soap buildup in the tub or shower regularly. Attach bath mats securely with double-sided non-slip rug tape. Do not have throw rugs and other things on the floor that can make you trip. What can I do in the bedroom? Use night lights. Make sure that you have a light by your bed that is easy to reach. Do not use any sheets or  blankets that are too big for your bed. They should not hang down onto the floor. Have a firm chair that has side arms. You can use this for support while you get dressed. Do not have throw rugs and other things on the floor that can make you trip. What can I do in the kitchen? Clean up any spills right away. Avoid walking on wet floors. Keep items that you use a lot in easy-to-reach places. If you need to reach something above you, use a strong step stool that has a grab bar. Keep electrical cords out of the way. Do not use floor polish or wax that makes floors slippery. If you must use wax, use non-skid floor wax. Do not have throw rugs and other things on the floor that can make you trip. What can I do with my stairs? Do not leave any items on the stairs. Make sure that there are  handrails on both sides of the stairs and use them. Fix handrails that are broken or loose. Make sure that handrails are as long as the stairways. Check any carpeting to make sure that it is firmly attached to the stairs. Fix any carpet that is loose or worn. Avoid having throw rugs at the top or bottom of the stairs. If you do have throw rugs, attach them to the floor with carpet tape. Make sure that you have a light switch at the top of the stairs and the bottom of the stairs. If you do not have them, ask someone to add them for you. What else can I do to help prevent falls? Wear shoes that: Do not have high heels. Have rubber bottoms. Are comfortable and fit you well. Are closed at the toe. Do not wear sandals. If you use a stepladder: Make sure that it is fully opened. Do not climb a closed stepladder. Make sure that both sides of the stepladder are locked into place. Ask someone to hold it for you, if possible. Clearly mark and make sure that you can see: Any grab bars or handrails. First and last steps. Where the edge of each step is. Use tools that help you move around (mobility aids) if they are needed. These include: Canes. Walkers. Scooters. Crutches. Turn on the lights when you go into a dark area. Replace any light bulbs as soon as they burn out. Set up your furniture so you have a clear path. Avoid moving your furniture around. If any of your floors are uneven, fix them. If there are any pets around you, be aware of where they are. Review your medicines with your doctor. Some medicines can make you feel dizzy. This can increase your chance of falling. Ask your doctor what other things that you can do to help prevent falls. This information is not intended to replace advice given to you by your health care provider. Make sure you discuss any questions you have with your health care provider. Document Released: 04/28/2009 Document Revised: 12/08/2015 Document Reviewed:  08/06/2014 Elsevier Interactive Patient Education  2017 [employer].
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
## 2023-03-11 ENCOUNTER — Telehealth: Payer: Self-pay

## 2023-03-11 NOTE — Progress Notes (Signed)
   Care Guide Note  03/11/2023 Name: Kristie [name] MRN: [id number] DOB: 05-Aug-1965  Referred by: Jeoffrey Massed, MD Reason for referral : Care Coordination (Outreach to schedule with pharm d )   Kristie [name] is a 57 y.o. year old female who is a primary care patient of McGowen, Maryjean Morn, MD. Kristie [name] was referred to the pharmacist for assistance related to HTN, HLD, and DM.    An unsuccessful telephone outreach was attempted today to contact the patient who was referred to the pharmacy team for assistance with medication assistance. Additional attempts will be made to contact the patient.   Kristie [name], Kristie [name] Care Guide [facility]  [place], [place] [address] Direct Dial: [phone] [name]

## 2023-03-19 ENCOUNTER — Other Ambulatory Visit: Payer: Self-pay | Admitting: Family Medicine

## 2023-03-19 NOTE — Progress Notes (Signed)
   Care Guide Note  03/19/2023 Name: BREEA FETTY MRN: [id number] DOB: Aug 30, 1965  Referred by: Jeoffrey Massed, MD Reason for referral : Care Coordination (Outreach to schedule with pharm d )   KHAYLA VORBECK is a 57 y.o. year old female who is a primary care patient of McGowen, Maryjean Morn, MD. Reagyn Piazza Bowie was referred to the pharmacist for assistance related to DM.    A second unsuccessful telephone outreach was attempted today to contact the patient who was referred to the pharmacy team for assistance with medication assistance. Additional attempts will be made to contact the patient.  Penne Lash, RMA Care Guide [facility]  [place], [place] [address] Direct Dial: [phone] [email or web address]

## 2023-03-22 NOTE — Progress Notes (Signed)
   Care Guide Note  03/22/2023 Name: MARYJANE DELBERT MRN: [id number] DOB: Jun 01, 1966  Referred by: Jeoffrey Massed, MD Reason for referral : Care Coordination (Outreach to schedule with pharm d )   SYNA HIGHSMITH is a 57 y.o. year old female who is a primary care patient of McGowen, Maryjean Morn, MD. Kaydon Kalbfleisch Dewberry was referred to the pharmacist for assistance related to HTN and HLD.    A third unsuccessful telephone outreach was attempted today to contact the patient who was referred to the pharmacy team for assistance with medication assistance. The Population Health team is pleased to engage with this patient at any time in the future upon receipt of referral and should he/she be interested in assistance from the [facility] team.   Penne Lash, RMA Care Guide [facility]  [place], [place] [address] Direct Dial: [phone] [email or web address]

## 2023-04-15 ENCOUNTER — Emergency Department (HOSPITAL_BASED_OUTPATIENT_CLINIC_OR_DEPARTMENT_OTHER): Payer: PPO | Admitting: Radiology

## 2023-04-15 ENCOUNTER — Encounter (HOSPITAL_BASED_OUTPATIENT_CLINIC_OR_DEPARTMENT_OTHER): Payer: Self-pay | Admitting: *Deleted

## 2023-04-15 ENCOUNTER — Other Ambulatory Visit: Payer: Self-pay

## 2023-04-15 DIAGNOSIS — S8992XA Unspecified injury of left lower leg, initial encounter: Secondary | ICD-10-CM | POA: Diagnosis not present

## 2023-04-15 DIAGNOSIS — Z794 Long term (current) use of insulin: Secondary | ICD-10-CM | POA: Diagnosis not present

## 2023-04-15 DIAGNOSIS — S8392XA Sprain of unspecified site of left knee, initial encounter: Secondary | ICD-10-CM | POA: Diagnosis not present

## 2023-04-15 DIAGNOSIS — I10 Essential (primary) hypertension: Secondary | ICD-10-CM | POA: Insufficient documentation

## 2023-04-15 DIAGNOSIS — E119 Type 2 diabetes mellitus without complications: Secondary | ICD-10-CM | POA: Insufficient documentation

## 2023-04-15 DIAGNOSIS — M25562 Pain in left knee: Secondary | ICD-10-CM | POA: Diagnosis not present

## 2023-04-15 DIAGNOSIS — X58XXXA Exposure to other specified factors, initial encounter: Secondary | ICD-10-CM | POA: Insufficient documentation
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
## 2023-04-16 ENCOUNTER — Emergency Department (HOSPITAL_BASED_OUTPATIENT_CLINIC_OR_DEPARTMENT_OTHER)
Admission: EM | Admit: 2023-04-16 | Discharge: 2023-04-16 | Disposition: A | Discharge status: Home / Self Care | Payer: PPO | Attending: Emergency Medicine | Admitting: Emergency Medicine

## 2023-04-16 DIAGNOSIS — S8392XA Sprain of unspecified site of left knee, initial encounter: Secondary | ICD-10-CM | POA: Diagnosis not present

## 2023-04-16 MED ORDER — HYDROCODONE-ACETAMINOPHEN 5-325 MG PO TABS: 1.0000 | ORAL_TABLET | Freq: Four times a day (QID) | ORAL | 0 refills | Status: DC | PRN

## 2023-04-16 MED ORDER — HYDROCODONE-ACETAMINOPHEN 5-325 MG PO TABS
2.0000 | ORAL_TABLET | Freq: Once | ORAL | Status: AC
  Administered 2023-04-16: 2 via ORAL
  Filled 2023-04-16: qty 2

## 2023-04-16 NOTE — Discharge Instructions (Signed)
Wear knee immobilizer and weight-bear as tolerated.  Ice for 20 minutes every 2 hours while awake for the next 2 days.  Take ibuprofen 600 mg every 6 hours as needed for pain.  Begin taking hydrocodone as prescribed as needed for pain not relieved with ibuprofen.  Follow-up with orthopedics in the next 3 to 4 days.  The contact information for Delbert Harness has been provided in this discharge summary for you to call and make these arrangements.
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
## 2023-04-18 ENCOUNTER — Other Ambulatory Visit: Payer: Self-pay | Admitting: Family Medicine

## 2023-04-18 ENCOUNTER — Other Ambulatory Visit: Payer: Self-pay | Admitting: Internal Medicine

## 2023-04-19 NOTE — Telephone Encounter (Signed)
Novolog refill request complete

## 2023-04-25 DIAGNOSIS — M25562 Pain in left knee: Secondary | ICD-10-CM | POA: Diagnosis not present

## 2023-05-03 DIAGNOSIS — M25562 Pain in left knee: Secondary | ICD-10-CM | POA: Diagnosis not present

## 2023-05-05 ENCOUNTER — Other Ambulatory Visit: Payer: Self-pay | Admitting: Family Medicine

## 2023-05-16 DIAGNOSIS — Z978 Presence of other specified devices: Secondary | ICD-10-CM | POA: Diagnosis not present

## 2023-05-16 DIAGNOSIS — E1065 Type 1 diabetes mellitus with hyperglycemia: Secondary | ICD-10-CM | POA: Diagnosis not present

## 2023-05-16 DIAGNOSIS — Z9641 Presence of insulin pump (external) (internal): Secondary | ICD-10-CM | POA: Diagnosis not present

## 2023-05-17 DIAGNOSIS — M25562 Pain in left knee: Secondary | ICD-10-CM | POA: Diagnosis not present

## 2023-05-20 DIAGNOSIS — S83512A Sprain of anterior cruciate ligament of left knee, initial encounter: Secondary | ICD-10-CM | POA: Diagnosis not present

## 2023-05-29 ENCOUNTER — Other Ambulatory Visit: Payer: Self-pay | Admitting: Family Medicine

## 2023-06-06 ENCOUNTER — Other Ambulatory Visit: Payer: Self-pay

## 2023-06-06 ENCOUNTER — Emergency Department (HOSPITAL_COMMUNITY): Payer: PRIVATE HEALTH INSURANCE

## 2023-06-06 ENCOUNTER — Encounter (HOSPITAL_COMMUNITY): Payer: Self-pay

## 2023-06-06 ENCOUNTER — Emergency Department (HOSPITAL_COMMUNITY)
Admission: EM | Admit: 2023-06-06 | Discharge: 2023-06-07 | Disposition: A | Discharge status: Home / Self Care | Payer: PRIVATE HEALTH INSURANCE | Attending: Emergency Medicine | Admitting: Emergency Medicine

## 2023-06-06 DIAGNOSIS — Z794 Long term (current) use of insulin: Secondary | ICD-10-CM | POA: Diagnosis not present

## 2023-06-06 DIAGNOSIS — R55 Syncope and collapse: Secondary | ICD-10-CM | POA: Insufficient documentation

## 2023-06-06 DIAGNOSIS — R000 Tachycardia, unspecified: Secondary | ICD-10-CM | POA: Diagnosis not present

## 2023-06-06 DIAGNOSIS — S0003XA Contusion of scalp, initial encounter: Secondary | ICD-10-CM

## 2023-06-06 DIAGNOSIS — D649 Anemia, unspecified: Secondary | ICD-10-CM

## 2023-06-06 DIAGNOSIS — E162 Hypoglycemia, unspecified: Secondary | ICD-10-CM | POA: Diagnosis not present

## 2023-06-06 DIAGNOSIS — W01198A Fall on same level from slipping, tripping and stumbling with subsequent striking against other object, initial encounter: Secondary | ICD-10-CM | POA: Diagnosis not present

## 2023-06-06 DIAGNOSIS — S0993XA Unspecified injury of face, initial encounter: Secondary | ICD-10-CM | POA: Diagnosis not present

## 2023-06-06 DIAGNOSIS — S01512A Laceration without foreign body of oral cavity, initial encounter: Secondary | ICD-10-CM | POA: Insufficient documentation

## 2023-06-06 DIAGNOSIS — E11649 Type 2 diabetes mellitus with hypoglycemia without coma: Secondary | ICD-10-CM | POA: Diagnosis not present

## 2023-06-06 LAB — BASIC METABOLIC PANEL
Anion gap: 4 — ABNORMAL LOW (ref 5–15)
Anion gap: 11 (ref 5–15)
BUN: 12 mg/dL (ref 6–20)
BUN: 17 mg/dL (ref 6–20)
CO2: 13 mmol/L — ABNORMAL LOW (ref 22–32)
CO2: 24 mmol/L (ref 22–32)
Calcium: 4.6 mg/dL — CL (ref 8.9–10.3)
Calcium: 9.3 mg/dL (ref 8.9–10.3)
Chloride: 127 mmol/L — ABNORMAL HIGH (ref 98–111)
Chloride: 105 mmol/L (ref 98–111)
Creatinine, Ser: 0.73 mg/dL (ref 0.44–1.00)
Creatinine, Ser: 0.89 mg/dL (ref 0.44–1.00)
GFR, Estimated: >60 mL/min (ref >60)
GFR, Estimated: >60 mL/min (ref >60)
Glucose, Bld: 200 mg/dL — ABNORMAL HIGH (ref 70–99)
Glucose, Bld: 43 mg/dL — CL (ref 70–99)
Potassium: <2 mmol/L — CL (ref 3.5–5.1)
Potassium: 3.3 mmol/L — ABNORMAL LOW (ref 3.5–5.1)
Sodium: 144 mmol/L (ref 135–145)
Sodium: 140 mmol/L (ref 135–145)

## 2023-06-06 LAB — CBC WITH DIFFERENTIAL/PLATELET
Abs Immature Granulocytes: 0.02 10*3/uL (ref 0.00–0.07)
Basophils Absolute: 0 10*3/uL (ref 0.0–0.1)
Basophils Relative: 1 %
Eosinophils Absolute: 0.1 10*3/uL (ref 0.0–0.5)
Eosinophils Relative: 1 %
HCT: 26.5 % — ABNORMAL LOW (ref 36.0–46.0)
Hemoglobin: 8.6 g/dL — ABNORMAL LOW (ref 12.0–15.0)
Immature Granulocytes: 0 %
Lymphocytes Relative: 30 %
Lymphs Abs: 1.7 10*3/uL (ref 0.7–4.0)
MCH: 29.4 pg (ref 26.0–34.0)
MCHC: 32.5 g/dL (ref 30.0–36.0)
MCV: 90.4 fL (ref 80.0–100.0)
Monocytes Absolute: 0.5 10*3/uL (ref 0.1–1.0)
Monocytes Relative: 9 %
Neutro Abs: 3.3 10*3/uL (ref 1.7–7.7)
Neutrophils Relative %: 59 %
Platelets: 205 10*3/uL (ref 150–400)
RBC: 2.93 MIL/uL — ABNORMAL LOW (ref 3.87–5.11)
RDW: 12.6 % (ref 11.5–15.5)
WBC: 5.6 10*3/uL (ref 4.0–10.5)
nRBC: 0 % (ref 0.0–0.2)

## 2023-06-06 LAB — CBG MONITORING, ED
Glucose-Capillary: 102 mg/dL — ABNORMAL HIGH (ref 70–99)
Glucose-Capillary: 158 mg/dL — ABNORMAL HIGH (ref 70–99)
Glucose-Capillary: 165 mg/dL — ABNORMAL HIGH (ref 70–99)
Glucose-Capillary: 299 mg/dL — ABNORMAL HIGH (ref 70–99)
Glucose-Capillary: 305 mg/dL — ABNORMAL HIGH (ref 70–99)
Glucose-Capillary: 33 mg/dL — CL (ref 70–99)
Glucose-Capillary: 35 mg/dL — CL (ref 70–99)
Glucose-Capillary: 47 mg/dL — ABNORMAL LOW (ref 70–99)

## 2023-06-06 MED ORDER — ACETAMINOPHEN 325 MG PO TABS
650.0000 mg | ORAL_TABLET | Freq: Four times a day (QID) | ORAL | Status: DC | PRN
  Administered 2023-06-06: 650 mg via ORAL
  Filled 2023-06-06: qty 2

## 2023-06-06 MED ORDER — DEXTROSE 50 % IV SOLN
INTRAVENOUS | Status: AC
  Filled 2023-06-06: qty 50

## 2023-06-06 MED ORDER — POTASSIUM CHLORIDE CRYS ER 20 MEQ PO TBCR
40.0000 meq | EXTENDED_RELEASE_TABLET | Freq: Once | ORAL | Status: AC
  Administered 2023-06-06: 40 meq via ORAL
  Filled 2023-06-06: qty 2

## 2023-06-06 MED ORDER — DEXTROSE 50 % IV SOLN
50.0000 mL | Freq: Once | INTRAVENOUS | Status: AC
  Administered 2023-06-06: 50 mL via INTRAVENOUS
  Filled 2023-06-06: qty 50

## 2023-06-06 MED ORDER — INSULIN ASPART 100 UNIT/ML IJ SOLN
5.0000 [IU] | Freq: Once | INTRAMUSCULAR | Status: AC
  Administered 2023-06-06: 5 [IU] via INTRAVENOUS

## 2023-06-06 MED ORDER — DEXTROSE 5 % IV BOLUS
1000.0000 mL | Freq: Once | INTRAVENOUS | Status: AC
  Administered 2023-06-06: 1000 mL via INTRAVENOUS

## 2023-06-06 MED ORDER — DEXTROSE 5 % IV BOLUS
250.0000 mL | Freq: Once | INTRAVENOUS | Status: AC
  Administered 2023-06-06: 250 mL via INTRAVENOUS

## 2023-06-06 MED ORDER — DEXTROSE 50 % IV SOLN
1.0000 | Freq: Once | INTRAVENOUS | Status: AC
  Administered 2023-06-06: 50 mL via INTRAVENOUS

## 2023-06-06 NOTE — Discharge Instructions (Addendum)
Call your endocrinologist tomorrow to have them adjust your insulin pod.

## 2023-06-06 NOTE — ED Notes (Signed)
[id number]

## 2023-06-06 NOTE — ED Notes (Signed)
Insulin pump removed and placed in pt purse
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
## 2023-06-06 NOTE — ED Triage Notes (Signed)
Pt BIB EMS from [facility] and had an unwitnessed fall. When EMS arrived pt was axox1. Dexcom alerted 40. Pt has blood on face but no lacerations. Pt arrives in c-collar and now axox4. VSS.

## 2023-06-06 NOTE — ED Provider Notes (Addendum)
[place] EMERGENCY DEPARTMENT AT [name] Provider Note   CSN: [id number] Arrival date & time: 06/06/23  1550     History  Chief Complaint  Patient presents with   Loss of Consciousness   Fall   Tachycardia    [name] is a 57 y.o. female.  Patient brought to the emergency department by EMS.  Patient was in [name] and her glucose dropped low.  Patient is reported to have fallen striking her head.  Fire department reported patient's glucose was in the low 30s.  Patient was given D50 by EMS.  They report patient had a glucose over 100 afterwards.  Patient complains of a cut to her tongue.  Patient has bruising to her head.  Patient denies any other areas of injury.  Patient has a Dexcom scanner which has been alerting her that her glucose was low.  Patient has a insulin pod.   Patient is followed by endocrinology at [name].  Patient sees primary care [name].  Patient denies any recent medication changes.  Patient denies any illness she has not had a cough or fever.  Patient denies any nausea or vomiting she has not had any abdominal pain.  Patient denies any previous hypoglycemic episodes like this.  Pt's husband reports pt has had similar in the past.  He reports pt has had changes in her dosage.  Pt is pending left knee surgery andis suppose to get her A1C down before she can have surgery.   The history is provided by the patient. No language interpreter was used.  Loss of Consciousness Episode history:  Single Most recent episode:  Today Associated symptoms: headaches   Associated symptoms: no dizziness and no shortness of breath   Fall Associated symptoms include headaches. Pertinent negatives include no shortness of breath.       Home Medications Prior to Admission medications   Medication Sig Start Date End Date Taking? Authorizing Provider  Alcohol Swabs (ALCOHOL PREP) 70 % PADS USE TO TEST BLOOD SUGAR FOUR TIMES DAILY AS DIRECTED 06/07/15    Carlus Pavlov, MD  BERBERINE CHLORIDE PO Take 510 mg by mouth 3 (three) times daily.    [provider]  clonazePAM (KLONOPIN) 1 MG tablet TAKE 1 TABLET BY MOUTH THREE TIMES A DAY AS NEEDED FOR ANXIETY 03/20/23   McGowen, Maryjean Morn, MD  Continuous Blood Gluc Sensor (FREESTYLE LIBRE 3 SENSOR) MISC Place 1 sensor on the skin Q14 days. Use to check glucose continuously Patient not taking: Reported on 02/27/2023 08/07/22   Kristie Massed, MD  Glucagon 3 MG/DOSE POWD Place 3 mg into the nose once as needed for up to 1 dose. 04/10/21   Carlus Pavlov, MD  HYDROcodone-acetaminophen (NORCO) 5-325 MG tablet Take 1-2 tablets by mouth every 6 (six) hours as needed. 04/16/23   Geoffery Lyons, MD  insulin aspart (NOVOLOG FLEXPEN) 100 UNIT/ML FlexPen Inject 5 Units into the skin 3 (three) times daily with meals. 07/23/22   Pokhrel, Rebekah Chesterfield, MD  insulin aspart (NOVOLOG) 100 UNIT/ML FlexPen TID with meals CBG 70 - 120: 0 units CBG 121 - 150: 0 units CBG 151 - 200: 0 units CBG 201 - 250: 2 units CBG 251 - 300: 3 units CBG 301 - 350: 4 units CBG 351 - 400: 5 units CBG > 400 07/23/22   Pokhrel, Laxman, MD  Insulin Disposable Pump (OMNIPOD 5 G6 PODS, GEN 5,) MISC SMARTSIG:1 Topical Every 3 Days 01/25/23   [provider]  Insulin Disposable Pump (OMNIPOD 5 G6 PODS, GEN 5,) MISC Place onto the skin. 11/05/22   [provider]  insulin glargine (LANTUS) 100 UNIT/ML Solostar Pen Inject 38 Units into the skin daily. 07/23/22   Pokhrel, Rebekah Chesterfield, MD  Insulin Pen Needle 32G X 8 MM MISC Use as directed 07/23/22   Pokhrel, Laxman, MD  NOVOLOG 100 UNIT/ML injection USE UP TO 120 UNITS IN INSULIN PUMP PER DAY 04/19/23   Carlus Pavlov, MD  venlafaxine XR (EFFEXOR-XR) 150 MG 24 hr capsule Take 2 capsules (300 mg total) by mouth daily. 12/25/22   McGowen, Maryjean Morn, MD      Allergies    Metformin and related    Review of Systems   Review of Systems  HENT:  Negative for congestion.   Eyes:  Negative for pain  and visual disturbance.  Respiratory:  Negative for cough and shortness of breath.   Cardiovascular:  Positive for syncope.  Neurological:  Positive for headaches. Negative for dizziness.  Psychiatric/Behavioral:  Positive for behavioral problems.   All other systems reviewed and are negative.   Physical Exam Updated Vital Signs BP 116/85   Pulse 91   Temp 98.2 F (36.8 C) (Oral)   Resp 20   Ht 5\' 7"  (1.702 m)   Wt 104.3 kg   LMP  (LMP Unknown) Comment: preg neg 03/17/15  SpO2 99%   BMI 36.02 kg/m  Physical Exam Vitals and nursing note reviewed.  Constitutional:      Appearance: Normal appearance. She is well-developed.  HENT:     Head: Normocephalic.     Comments: Swelling right head.    Right Ear: External ear normal.     Left Ear: External ear normal.     Nose: Nose normal.     Mouth/Throat:     Mouth: Mucous membranes are moist.     Pharynx: Oropharynx is clear.     Comments: Small laceration right tongue.  Approximately 5 mm. Cardiovascular:     Rate and Rhythm: Normal rate.     Pulses: Normal pulses.  Pulmonary:     Effort: Pulmonary effort is normal.  Abdominal:     General: Abdomen is flat. There is no distension.  Musculoskeletal:        General: Normal range of motion.     Cervical back: Normal range of motion.  Skin:    General: Skin is warm.  Neurological:     General: No focal deficit present.     Mental Status: She is alert and oriented to person, place, and time.  Psychiatric:        Mood and Affect: Mood normal.     ED Results / Procedures / Treatments   Labs (all labs ordered are listed, but only abnormal results are displayed) Labs Reviewed  CBC WITH DIFFERENTIAL/PLATELET - Abnormal; Notable for the following components:      Result Value   RBC 2.93 (*)    Hemoglobin 8.6 (*)    HCT 26.5 (*)    All other components within normal limits  BASIC METABOLIC PANEL - Abnormal; Notable for the following components:   Potassium <2.0 (*)     Chloride 127 (*)    CO2 13 (*)    Glucose, Bld 200 (*)    Calcium 4.6 (*)    Anion gap 4 (*)    All other components within normal limits  BASIC METABOLIC PANEL - Abnormal; Notable for the following components:   Potassium 3.3 (*)  Glucose, Bld 43 (*)    All other components within normal limits  CBG MONITORING, ED - Abnormal; Notable for the following components:   Glucose-Capillary 35 (*)    All other components within normal limits  CBG MONITORING, ED - Abnormal; Notable for the following components:   Glucose-Capillary 165 (*)    All other components within normal limits  CBG MONITORING, ED - Abnormal; Notable for the following components:   Glucose-Capillary 102 (*)    All other components within normal limits  CBG MONITORING, ED - Abnormal; Notable for the following components:   Glucose-Capillary 33 (*)    All other components within normal limits  CBG MONITORING, ED - Abnormal; Notable for the following components:   Glucose-Capillary 47 (*)    All other components within normal limits  CBG MONITORING, ED - Abnormal; Notable for the following components:   Glucose-Capillary 158 (*)    All other components within normal limits    EKG EKG Interpretation Date/Time:  Thursday June 06 2023 16:08:55 EST Ventricular Rate:  115 PR Interval:  172 QRS Duration:  107 QT Interval:  456 QTC Calculation: 631 R Axis:   54  Text Interpretation: Sinus tachycardia Abnormal R-wave progression, early transition Prolonged QT interval Confirmed by Vanetta Mulders [phone]) on 06/06/2023 4:12:08 PM  Radiology CT Head Wo Contrast  Result Date: 06/06/2023 CLINICAL DATA:  Neck trauma. EXAM: CT HEAD WITHOUT CONTRAST CT CERVICAL SPINE WITHOUT CONTRAST TECHNIQUE: Multidetector CT imaging of the head and cervical spine was performed following the standard protocol without intravenous contrast. Multiplanar CT image reconstructions of the cervical spine were also generated. RADIATION DOSE  REDUCTION: This exam was performed according to the departmental dose-optimization program which includes automated exposure control, adjustment of the mA and/or kV according to patient size and/or use of iterative reconstruction technique. COMPARISON:  Head CT dated 03/17/2015. FINDINGS: CT HEAD FINDINGS Brain: The ventricles and sulci are appropriate size for the patient's age. The gray-white matter discrimination is preserved. There is no acute intracranial hemorrhage. No mass effect or midline shift. No extra-axial fluid collection. Vascular: No hyperdense vessel or unexpected calcification. Skull: Normal. Negative for fracture or focal lesion. Sinuses/Orbits: No acute finding. Other: Right parietal scalp contusion. CT CERVICAL SPINE FINDINGS Alignment: No acute subluxation. Skull base and vertebrae: No acute fracture. 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 Bilateral fusion screws through the C1 and C2 lateral masses. The screws appear intact. Soft tissues and spinal canal: No prevertebral fluid or swelling. No visible canal hematoma. Disc levels:  No acute findings.  Degenerative changes. Upper chest: Negative. Other: None IMPRESSION: 1. No acute intracranial pathology. 2. No acute/traumatic cervical spine pathology. 3. 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 Electronically Signed   By: Elgie Collard M.D.   On: 06/06/2023 18:48   CT Cervical Spine Wo Contrast  Result Date: 06/06/2023 CLINICAL DATA:  Neck trauma. EXAM: CT HEAD WITHOUT CONTRAST CT CERVICAL SPINE WITHOUT CONTRAST TECHNIQUE: Multidetector CT imaging of the head and cervical spine was performed following the standard protocol without intravenous contrast. Multiplanar CT image reconstructions of the cervical spine were also generated. RADIATION DOSE REDUCTION: This exam was performed according to the departmental dose-optimization program which includes automated exposure control, adjustment of the mA and/or kV according to patient  size and/or use of iterative reconstruction technique. COMPARISON:  Head CT dated 03/17/2015. FINDINGS: CT HEAD FINDINGS Brain: The ventricles and sulci are appropriate size for the patient's age. The gray-white matter discrimination is preserved. There is  no acute intracranial hemorrhage. No mass effect or midline shift. No extra-axial fluid collection. Vascular: No hyperdense vessel or unexpected calcification. Skull: Normal. Negative for fracture or focal lesion. Sinuses/Orbits: No acute finding. Other: Right parietal scalp contusion. CT CERVICAL SPINE FINDINGS Alignment: No acute subluxation. Skull base and vertebrae: No acute fracture. 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 Bilateral fusion screws through the C1 and C2 lateral masses. The screws appear intact. Soft tissues and spinal canal: No prevertebral fluid or swelling. No visible canal hematoma. Disc levels:  No acute findings.  Degenerative changes. Upper chest: Negative. Other: None IMPRESSION: 1. No acute intracranial pathology. 2. No acute/traumatic cervical spine pathology. 3. 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 Electronically Signed   By: Elgie Collard M.D.   On: 06/06/2023 18:48    Procedures Procedures    Medications Ordered in ED Medications  dextrose 50 % solution (  Not Given 06/06/23 1624)  acetaminophen (TYLENOL) tablet 650 mg (650 mg Oral Given 06/06/23 1812)  dextrose 50 % solution 50 mL (50 mLs Intravenous Given 06/06/23 1602)  dextrose 5 % bolus 250 mL (0 mLs Intravenous Stopped 06/06/23 1637)  dextrose 5 % bolus 1,000 mL ( Intravenous Rate/Dose Verify 06/06/23 1921)  dextrose 50 % solution 50 mL (50 mLs Intravenous Given 06/06/23 1758)    ED Course/ Medical Decision Making/ A&P                                 Medical Decision Making Patient arrives to the emergency department after her glucose dropped and she fell hitting her head.  Amount and/or Complexity of Data Reviewed Independent Historian:  EMS    Details: EMS reports given patient D50. External Data Reviewed: notes.    Details: Primary care notes and endocrinology notes from [facility] reviewed Labs: ordered. Decision-making details documented in ED Course.    Details: Labs ordered reviewed and interpreted.  Patient had a be met with some usually low numbers.  This was repeated I suspect was diluted. Hemoglobin 8.6.  Hemocult negative  Radiology: ordered and independent interpretation performed. Decision-making details documented in ED Course.    Details: CT head and CT cervical spine are ordered reviewed and interpreted.  Patient has no evidence of intercranial injury or cervical spine injury ECG/medicine tests: ordered and independent interpretation performed. Decision-making details documented in ED Course.    Details: EKG shows sinus tachycardia at 115.  No ST changes Discussion of management or test interpretation with external provider(s): Hospitalist  consulted for admission.  Dr. Julian Reil will see and evaluate.  Risk OTC drugs. Prescription drug management. Risk Details: Patient has been given D50 x 3.  Patient is on a D5 drip at 250 an hour.         Pt's care turned over to [name].  Repeat cbc and cbg pending   Final Clinical Impression(s) / ED Diagnoses Final diagnoses:  Hypoglycemia  Syncope and collapse  Contusion of scalp, initial encounter  Laceration of tongue, initial encounter    Rx / DC Orders ED Discharge Orders     None         Osie Cheeks 06/06/23 2225    Elson Areas, PA-C 06/06/23 2356    Vanetta Mulders, MD 06/08/23 [phone]

## 2023-06-07 ENCOUNTER — Ambulatory Visit (INDEPENDENT_AMBULATORY_CARE_PROVIDER_SITE_OTHER): Payer: PPO | Admitting: Family Medicine

## 2023-06-07 VITALS — BP 112/80 | HR 100 | Wt 215.6 lb

## 2023-06-07 DIAGNOSIS — Z79899 Other long term (current) drug therapy: Secondary | ICD-10-CM

## 2023-06-07 DIAGNOSIS — F411 Generalized anxiety disorder: Secondary | ICD-10-CM

## 2023-06-07 DIAGNOSIS — S83512S Sprain of anterior cruciate ligament of left knee, sequela: Secondary | ICD-10-CM

## 2023-06-07 DIAGNOSIS — E162 Hypoglycemia, unspecified: Secondary | ICD-10-CM | POA: Diagnosis not present

## 2023-06-07 DIAGNOSIS — S01512A Laceration without foreign body of oral cavity, initial encounter: Secondary | ICD-10-CM | POA: Diagnosis not present

## 2023-06-07 LAB — POC OCCULT BLOOD, ED: Fecal Occult Bld: NEGATIVE

## 2023-06-07 LAB — CBC
HCT: 40.1 % (ref 36.0–46.0)
Hemoglobin: 13 g/dL (ref 12.0–15.0)
MCH: 28.5 pg (ref 26.0–34.0)
MCHC: 32.4 g/dL (ref 30.0–36.0)
MCV: 87.9 fL (ref 80.0–100.0)
Platelets: 268 10*3/uL (ref 150–400)
RBC: 4.56 MIL/uL (ref 3.87–5.11)
RDW: 12.8 % (ref 11.5–15.5)
WBC: 9.8 10*3/uL (ref 4.0–10.5)
nRBC: 0 % (ref 0.0–0.2)

## 2023-06-07 LAB — CBG MONITORING, ED: Glucose-Capillary: 237 mg/dL — ABNORMAL HIGH (ref 70–99)

## 2023-06-07 MED ORDER — CLONAZEPAM 1 MG PO TABS: ORAL_TABLET | ORAL | 5 refills | Status: DC

## 2023-06-07 MED ORDER — VENLAFAXINE HCL ER 150 MG PO CP24: 300.0000 mg | ORAL_CAPSULE | Freq: Every day | ORAL | 1 refills | Status: DC

## 2023-06-07 NOTE — ED Provider Notes (Signed)
  Physical Exam  BP 129/81   Pulse 95   Temp 97.9 F (36.6 C) (Oral)   Resp (!) 21   Ht 5\' 7"  (1.702 m)   Wt 104.3 kg   LMP  (LMP Unknown) Comment: preg neg 03/17/15  SpO2 98%   BMI 36.02 kg/m   Physical Exam  Procedures  Procedures  ED Course / MDM    Medical Decision Making Amount and/or Complexity of Data Reviewed Labs: ordered. Radiology: ordered.  Risk OTC drugs. Prescription drug management.   Patient care assumed at shift handoff from Langston Masker, PA-C.  See her note for full details.  In short, 57 year old female patient with Omnipod insulin device presented to the emergency department after episode of hypoglycemia.  Patient had been treated with multiple rounds of D50 and D5 drip.  Glucose seem to be overcorrected at greater than 300 and patient was administered 5 units of insulin.  At time of my assumption of care plan to repeat CBC and CBG.  Initial CBC showed hemoglobin of 8.6, inconsistent with previous labs.  Earlier labs also had a initial BMP which did not seem accurate with vastly different numbers on repeat draw. Repeat CBC appears to be in line with patient's baseline.  Hemoglobin 13.0 with no abnormalities on CBC.  Repeat CBG 237.  No further episodes of hypoglycemia.  Patient stable at this time for discharge home.       [name] 06/07/23 0129    Nira Conn, MD 06/07/23 [phone]

## 2023-06-27 ENCOUNTER — Encounter: Payer: Self-pay | Admitting: Family Medicine

## 2023-06-27 ENCOUNTER — Ambulatory Visit: Payer: PPO | Admitting: Family Medicine

## 2023-06-27 VITALS — HR 85 | Ht 67.0 in | Wt 217.6 lb

## 2023-06-27 DIAGNOSIS — R748 Abnormal levels of other serum enzymes: Secondary | ICD-10-CM

## 2023-06-27 DIAGNOSIS — Z0000 Encounter for general adult medical examination without abnormal findings: Secondary | ICD-10-CM

## 2023-06-27 DIAGNOSIS — E7800 Pure hypercholesterolemia, unspecified: Secondary | ICD-10-CM

## 2023-06-27 DIAGNOSIS — F411 Generalized anxiety disorder: Secondary | ICD-10-CM

## 2023-06-27 LAB — LIPID PANEL
Cholesterol: 206 mg/dL — ABNORMAL HIGH (ref 0–200)
HDL: 51.3 mg/dL (ref >39.00)
LDL Cholesterol: 117 mg/dL — ABNORMAL HIGH (ref 0–99)
NonHDL: 154.54
Total CHOL/HDL Ratio: 4
Triglycerides: 186 mg/dL — ABNORMAL HIGH (ref 0.0–149.0)
VLDL: 37.2 mg/dL (ref 0.0–40.0)

## 2023-06-27 LAB — HEPATIC FUNCTION PANEL
ALT: 22 U/L (ref 0–35)
AST: 21 U/L (ref 0–37)
Albumin: 4 g/dL (ref 3.5–5.2)
Alkaline Phosphatase: 129 U/L — ABNORMAL HIGH (ref 39–117)
Bilirubin, Direct: 0.1 mg/dL (ref 0.0–0.3)
Total Bilirubin: 0.9 mg/dL (ref 0.2–1.2)
Total Protein: 6.4 g/dL (ref 6.0–8.3)

## 2023-06-27 LAB — GAMMA GT: GGT: 62 U/L — ABNORMAL HIGH (ref 7–51)

## 2023-06-27 LAB — TSH: TSH: 0.87 u[IU]/mL (ref 0.35–5.50)
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
## 2023-07-14 ENCOUNTER — Encounter: Payer: Self-pay | Admitting: Family Medicine

## 2023-07-15 ENCOUNTER — Other Ambulatory Visit: Payer: Self-pay | Admitting: Family Medicine

## 2023-08-16 DIAGNOSIS — E1065 Type 1 diabetes mellitus with hyperglycemia: Secondary | ICD-10-CM | POA: Diagnosis not present

## 2023-08-16 LAB — HEMOGLOBIN A1C
Hemoglobin A1C: 8
Hemoglobin A1C: 8

## 2023-09-05 NOTE — Patient Instructions (Signed)

## 2023-09-09 ENCOUNTER — Other Ambulatory Visit: Payer: Self-pay

## 2023-09-09 ENCOUNTER — Ambulatory Visit (INDEPENDENT_AMBULATORY_CARE_PROVIDER_SITE_OTHER): Payer: PPO | Admitting: Family Medicine

## 2023-09-09 ENCOUNTER — Encounter: Payer: Self-pay | Admitting: Family Medicine

## 2023-09-09 VITALS — BP 107/74 | HR 89 | Ht 67.0 in | Wt 218.6 lb

## 2023-09-09 DIAGNOSIS — E109 Type 1 diabetes mellitus without complications: Secondary | ICD-10-CM

## 2023-09-09 DIAGNOSIS — R748 Abnormal levels of other serum enzymes: Secondary | ICD-10-CM

## 2023-09-09 DIAGNOSIS — E7800 Pure hypercholesterolemia, unspecified: Secondary | ICD-10-CM

## 2023-09-09 DIAGNOSIS — F411 Generalized anxiety disorder: Secondary | ICD-10-CM

## 2023-09-09 LAB — COMPREHENSIVE METABOLIC PANEL
ALT: 23 U/L (ref 0–35)
AST: 21 U/L (ref 0–37)
Albumin: 4.1 g/dL (ref 3.5–5.2)
Alkaline Phosphatase: 113 U/L (ref 39–117)
BUN: 25 mg/dL — ABNORMAL HIGH (ref 6–23)
CO2: 27 meq/L (ref 19–32)
Calcium: 8.7 mg/dL (ref 8.4–10.5)
Chloride: 101 meq/L (ref 96–112)
Creatinine, Ser: 0.92 mg/dL (ref 0.40–1.20)
GFR: 69 mL/min (ref >60.00)
Glucose, Bld: 312 mg/dL — ABNORMAL HIGH (ref 70–99)
Potassium: 4.7 meq/L (ref 3.5–5.1)
Sodium: 136 meq/L (ref 135–145)
Total Bilirubin: 1 mg/dL (ref 0.2–1.2)
Total Protein: 6.6 g/dL (ref 6.0–8.3)

## 2023-09-09 LAB — LIPID PANEL
Cholesterol: 220 mg/dL — ABNORMAL HIGH (ref 0–200)
HDL: 62.8 mg/dL (ref >39.00)
LDL Cholesterol: 131 mg/dL — ABNORMAL HIGH (ref 0–99)
NonHDL: 156.98
Total CHOL/HDL Ratio: 3
Triglycerides: 130 mg/dL (ref 0.0–149.0)
VLDL: 26 mg/dL (ref 0.0–40.0)

## 2023-09-09 LAB — MICROALBUMIN / CREATININE URINE RATIO
Creatinine,U: 175.3 mg/dL
Microalb Creat Ratio: 17.7 mg/g (ref 0.0–30.0)
Microalb, Ur: 3.1 mg/dL — ABNORMAL HIGH (ref 0.0–1.9)

## 2023-09-09 LAB — GAMMA GT: GGT: 64 U/L — ABNORMAL HIGH (ref 7–51)

## 2023-09-09 MED ORDER — ROSUVASTATIN CALCIUM 5 MG PO TABS: 5.0000 mg | ORAL_TABLET | Freq: Every day | ORAL | 1 refills | Status: DC
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
## 2023-09-10 ENCOUNTER — Encounter: Payer: Self-pay | Admitting: Family Medicine

## 2023-10-01 ENCOUNTER — Other Ambulatory Visit: Payer: Self-pay | Admitting: Family Medicine

## 2023-10-16 DIAGNOSIS — S83512D Sprain of anterior cruciate ligament of left knee, subsequent encounter: Secondary | ICD-10-CM | POA: Diagnosis not present

## 2023-10-21 ENCOUNTER — Ambulatory Visit: Payer: PPO | Admitting: Family Medicine

## 2023-10-21 DIAGNOSIS — R748 Abnormal levels of other serum enzymes: Secondary | ICD-10-CM
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
## 2023-11-07 ENCOUNTER — Encounter: Payer: Self-pay | Admitting: Family Medicine

## 2023-11-15 DIAGNOSIS — E1065 Type 1 diabetes mellitus with hyperglycemia: Secondary | ICD-10-CM | POA: Diagnosis not present

## 2023-11-15 DIAGNOSIS — Z9641 Presence of insulin pump (external) (internal): Secondary | ICD-10-CM | POA: Diagnosis not present

## 2023-11-15 DIAGNOSIS — Z1330 Encounter for screening examination for mental health and behavioral disorders, unspecified: Secondary | ICD-10-CM | POA: Diagnosis not present

## 2023-11-15 DIAGNOSIS — Z978 Presence of other specified devices: Secondary | ICD-10-CM | POA: Diagnosis not present

## 2023-11-15 LAB — HEMOGLOBIN A1C: Hemoglobin A1C: 9.1

## 2023-11-21 ENCOUNTER — Encounter: Payer: Self-pay | Admitting: Family Medicine

## 2023-11-21 ENCOUNTER — Ambulatory Visit: Admitting: Family Medicine

## 2023-11-21 VITALS — BP 130/86 | HR 91 | Ht 67.0 in | Wt 228.0 lb

## 2023-11-21 DIAGNOSIS — E7800 Pure hypercholesterolemia, unspecified: Secondary | ICD-10-CM

## 2023-11-21 DIAGNOSIS — R748 Abnormal levels of other serum enzymes: Secondary | ICD-10-CM
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
## 2023-11-22 LAB — LIPID PANEL
Cholesterol: 174 mg/dL (ref 0–200)
HDL: 68.9 mg/dL (ref >39.00)
LDL Cholesterol: 73 mg/dL (ref 0–99)
NonHDL: 105.08
Total CHOL/HDL Ratio: 3
Triglycerides: 158 mg/dL — ABNORMAL HIGH (ref 0.0–149.0)
VLDL: 31.6 mg/dL (ref 0.0–40.0)

## 2023-11-22 LAB — GAMMA GT: GGT: 90 U/L — ABNORMAL HIGH (ref 7–51)

## 2023-11-22 LAB — HEPATIC FUNCTION PANEL
ALT: 76 U/L — ABNORMAL HIGH (ref 0–35)
AST: 64 U/L — ABNORMAL HIGH (ref 0–37)
Albumin: 4 g/dL (ref 3.5–5.2)
Alkaline Phosphatase: 169 U/L — ABNORMAL HIGH (ref 39–117)
Bilirubin, Direct: 0.1 mg/dL (ref 0.0–0.3)
Total Bilirubin: 0.7 mg/dL (ref 0.2–1.2)
Total Protein: 6.6 g/dL (ref 6.0–8.3)

## 2023-11-25 ENCOUNTER — Telehealth: Payer: Self-pay

## 2023-11-25 ENCOUNTER — Encounter: Payer: Self-pay | Admitting: Family Medicine

## 2023-11-25 DIAGNOSIS — R748 Abnormal levels of other serum enzymes: Secondary | ICD-10-CM

## 2023-11-25 DIAGNOSIS — R7401 Elevation of levels of liver transaminase levels: Secondary | ICD-10-CM

## 2023-11-25 NOTE — Telephone Encounter (Signed)
-----   Message from Shelvia Dick sent at 11/25/2023  8:08 AM EDT ----- I sent patient a MyChart message about these results. Please order limited right upper quadrant abdominal ultrasound for [facility], diagnosis elevated transaminase, elevated alkaline phosphatase, and elevated GGT.  thx

## 2023-11-30 ENCOUNTER — Ambulatory Visit (HOSPITAL_BASED_OUTPATIENT_CLINIC_OR_DEPARTMENT_OTHER)
Admission: RE | Admit: 2023-11-30 | Discharge: 2023-11-30 | Disposition: A | Discharge status: Home / Self Care | Source: Ambulatory Visit | Attending: Family Medicine | Admitting: Family Medicine

## 2023-11-30 DIAGNOSIS — R748 Abnormal levels of other serum enzymes: Secondary | ICD-10-CM | POA: Diagnosis not present

## 2023-11-30 DIAGNOSIS — R7401 Elevation of levels of liver transaminase levels: Secondary | ICD-10-CM | POA: Diagnosis not present

## 2023-12-01 ENCOUNTER — Other Ambulatory Visit: Payer: Self-pay | Admitting: Family Medicine

## 2023-12-02 ENCOUNTER — Encounter: Payer: Self-pay | Admitting: Family Medicine

## 2023-12-02 ENCOUNTER — Ambulatory Visit: Payer: Self-pay | Admitting: Family Medicine

## 2023-12-03 ENCOUNTER — Other Ambulatory Visit: Payer: Self-pay | Admitting: Family Medicine

## 2023-12-22 ENCOUNTER — Other Ambulatory Visit: Payer: Self-pay | Admitting: Family Medicine

## 2023-12-24 ENCOUNTER — Other Ambulatory Visit: Payer: Self-pay | Admitting: Family Medicine

## 2023-12-25 NOTE — Telephone Encounter (Signed)
 Pt has upcoming appt 6/12

## 2023-12-26 ENCOUNTER — Encounter: Payer: Self-pay | Admitting: Family Medicine

## 2023-12-26 ENCOUNTER — Ambulatory Visit (INDEPENDENT_AMBULATORY_CARE_PROVIDER_SITE_OTHER): Payer: Self-pay | Admitting: Family Medicine

## 2023-12-26 VITALS — BP 105/71 | HR 74 | Temp 98.3°F | Ht 67.0 in | Wt 228.4 lb

## 2023-12-26 DIAGNOSIS — R748 Abnormal levels of other serum enzymes: Secondary | ICD-10-CM

## 2023-12-26 DIAGNOSIS — F411 Generalized anxiety disorder: Secondary | ICD-10-CM

## 2023-12-26 DIAGNOSIS — F4323 Adjustment disorder with mixed anxiety and depressed mood: Secondary | ICD-10-CM | POA: Diagnosis not present

## 2023-12-26 DIAGNOSIS — Z79899 Other long term (current) drug therapy: Secondary | ICD-10-CM

## 2023-12-26 MED ORDER — CLONAZEPAM 1 MG PO TABS: ORAL_TABLET | ORAL | 5 refills | Status: DC

## 2023-12-26 MED ORDER — VENLAFAXINE HCL ER 150 MG PO CP24: 300.0000 mg | ORAL_CAPSULE | Freq: Every day | ORAL | 3 refills | Status: DC
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
## 2024-03-05 ENCOUNTER — Encounter: Payer: Self-pay | Admitting: Pharmacist

## 2024-03-05 NOTE — Progress Notes (Signed)
 Pharmacy Quality Measure Review  This patient is appearing on a report for being at risk of failing the adherence measure for cholesterol (statin) medications this calendar year.   Medication: rosuvastatin  5mg  Last fill date: 10/02/2023 for 90 day supply  Patient has not filled recently but Dr Candise did sent in an updated Rx 12/03/2023 which was likely profiled since she has last filled in March 2025.   Noted patient history of elevated LFTs. No fattly liver noted on Abdominal Ultrasound done 11/30/2023. Dr Candise is consiering referral to GI for further work up.   Medications that could cause LFTs that she is taking:  Venlafaxine  - case reports of increased LFTs with 150mg  and higher doses. LFTs returned to normal about 2 to 3 months after discontinuing.  Rosuvastatin   - Increases in serum transaminases and marked persistent increases of hepatic transaminases are rare but generally could appeared after initiation, was transient, not accompanied by symptoms, and resolved or improved on continued therapy or after a brief interruption in treatment [ Clonazepam  - Transient elevations of serum transaminases and alkaline phosphatase have been reported.    Left message about rosuvastatin  on patient's VM.  Will forward message about LFTs to PCP.   Madelin Ray, PharmD Clinical Pharmacist [facility]  Population Health [phone]

## 2024-03-18 ENCOUNTER — Telehealth: Payer: Self-pay

## 2024-03-18 NOTE — Telephone Encounter (Signed)
 Type of forms received:  [place] DMV Medical Report  Routed to:  [employer] [place]  Paperwork received by :  Lonell   Individual made aware of 5-7 business day turn around (Y/N):  Y  Form completed and patient made aware of charges(Y/N): Y   Faxed to :   Form location:   [place] inbox  Please fax to [facility] [employer], and patient will pick up copy at her next visit.  Patient has appt with eye doctor to complete vision part of form. She forgot date of appt. Patient mother in hospice and not expecting to make it much longer.

## 2024-03-19 NOTE — Telephone Encounter (Signed)
 Forms reviewed and started completion for CMA.

## 2024-03-20 DIAGNOSIS — H40033 Anatomical narrow angle, bilateral: Secondary | ICD-10-CM | POA: Diagnosis not present

## 2024-03-20 DIAGNOSIS — E119 Type 2 diabetes mellitus without complications: Secondary | ICD-10-CM | POA: Diagnosis not present

## 2024-04-07 DIAGNOSIS — Z0279 Encounter for issue of other medical certificate: Secondary | ICD-10-CM

## 2024-04-07 NOTE — Telephone Encounter (Signed)
 Signed and put in box to go up front. Signed:  Gerlene Hockey, MD           04/07/2024

## 2024-04-07 NOTE — Telephone Encounter (Signed)
 Pt stopped by with the rest of the forms. Copy made and faxed to [facility] per pts request. Fax unable to go through. Copy also mailed to [facility]

## 2024-05-13 ENCOUNTER — Ambulatory Visit (INDEPENDENT_AMBULATORY_CARE_PROVIDER_SITE_OTHER): Admitting: *Deleted

## 2024-05-13 VITALS — Ht 67.0 in | Wt 228.0 lb

## 2024-05-13 DIAGNOSIS — Z0000 Encounter for general adult medical examination without abnormal findings: Secondary | ICD-10-CM
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
## 2024-05-13 NOTE — Patient Instructions (Signed)
 Ms. Kristie [name] , Thank you for taking time to come for your Medicare Wellness Visit. I appreciate your ongoing commitment to your health goals. Please review the following plan we discussed and let me know if I can assist you in the future.   Screening recommendations/referrals: Colonoscopy: up to date Mammogram Bone Density:  Recommended yearly ophthalmology/optometry visit for glaucoma screening and checkup Recommended yearly dental visit for hygiene and checkup  Vaccinations: Influenza vaccine:  Pneumococcal vaccine:  Tdap vaccine:  Shingles vaccine:          Preventive Care Female Preventive care refers to lifestyle choices and visits with your health care provider that can promote health and wellness. What does preventive care include? A yearly physical exam. This is also called an annual well check. Dental exams once or twice a year. Routine eye exams. Ask your health care provider how often you should have your eyes checked. Personal lifestyle choices, including: Daily care of your teeth and gums. Regular physical activity. Eating a healthy diet. Avoiding tobacco and drug use. Limiting alcohol use. Practicing safe sex. Taking low-dose aspirin every day. Taking vitamin and mineral supplements as recommended by your health care provider. What happens during an annual well check? The services and screenings done by your health care provider during your annual well check will depend on your age, overall health, lifestyle risk factors, and family history of disease. Counseling  Your health care provider may ask you questions about your: Alcohol use. Tobacco use. Drug use. Emotional well-being. Home and relationship well-being. Sexual activity. Eating habits. History of falls. Memory and ability to understand (cognition). Work and work astronomer. Reproductive health. Screening  You may have the following tests or measurements: Height, weight, and BMI. Blood  pressure. Lipid and cholesterol levels. These may be checked every 5 years, or more frequently if you are over 61 years old. Skin check. Lung cancer screening. You may have this screening every year starting at age 31 if you have a 30-pack-year history of smoking and currently smoke or have quit within the past 15 years. Fecal occult blood test (FOBT) of the stool. You may have this test every year starting at age 31. Flexible sigmoidoscopy or colonoscopy. You may have a sigmoidoscopy every 5 years or a colonoscopy every 10 years starting at age 12. Hepatitis C blood test. Hepatitis B blood test. Sexually transmitted disease (STD) testing. Diabetes screening. This is done by checking your blood sugar (glucose) after you have not eaten for a while (fasting). You may have this done every 1-3 years. Bone density scan. This is done to screen for osteoporosis. You may have this done starting at age 54. Mammogram. This may be done every 1-2 years. Talk to your health care provider about how often you should have regular mammograms. Talk with your health care provider about your test results, treatment options, and if necessary, the need for more tests. Vaccines  Your health care provider may recommend certain vaccines, such as: Influenza vaccine. This is recommended every year. Tetanus, diphtheria, and acellular pertussis (Tdap, Td) vaccine. You may need a Td booster every 10 years. Zoster vaccine. You may need this after age 65. Pneumococcal 13-valent conjugate (PCV13) vaccine. One dose is recommended after age 14. Pneumococcal polysaccharide (PPSV23) vaccine. One dose is recommended after age 26. Talk to your health care provider about which screenings and vaccines you need and how often you need them. This information is not intended to replace advice given to you by your health care  provider. Make sure you discuss any questions you have with your health care provider. Document Released:  07/29/2015 Document Revised: 03/21/2016 Document Reviewed: 05/03/2015 Elsevier Interactive Patient Education  2017 [employer].  Fall Prevention in the Home Falls can cause injuries. They can happen to people of all ages. There are many things you can do to make your home safe and to help prevent falls. What can I do on the outside of my home? Regularly fix the edges of walkways and driveways and fix any cracks. Remove anything that might make you trip as you walk through a door, such as a raised step or threshold. Trim any bushes or trees on the path to your home. Use bright outdoor lighting. Clear any walking paths of anything that might make someone trip, such as rocks or tools. Regularly check to see if handrails are loose or broken. Make sure that both sides of any steps have handrails. Any raised decks and porches should have guardrails on the edges. Have any leaves, snow, or ice cleared regularly. Use sand or salt on walking paths during winter. Clean up any spills in your garage right away. This includes oil or grease spills. What can I do in the bathroom? Use night lights. Install grab bars by the toilet and in the tub and shower. Do not use towel bars as grab bars. Use non-skid mats or decals in the tub or shower. If you need to sit down in the shower, use a plastic, non-slip stool. Keep the floor dry. Clean up any water that spills on the floor as soon as it happens. Remove soap buildup in the tub or shower regularly. Attach bath mats securely with double-sided non-slip rug tape. Do not have throw rugs and other things on the floor that can make you trip. What can I do in the bedroom? Use night lights. Make sure that you have a light by your bed that is easy to reach. Do not use any sheets or blankets that are too big for your bed. They should not hang down onto the floor. Have a firm chair that has side arms. You can use this for support while you get dressed. Do not have  throw rugs and other things on the floor that can make you trip. What can I do in the kitchen? Clean up any spills right away. Avoid walking on wet floors. Keep items that you use a lot in easy-to-reach places. If you need to reach something above you, use a strong step stool that has a grab bar. Keep electrical cords out of the way. Do not use floor polish or wax that makes floors slippery. If you must use wax, use non-skid floor wax. Do not have throw rugs and other things on the floor that can make you trip. What can I do with my stairs? Do not leave any items on the stairs. Make sure that there are handrails on both sides of the stairs and use them. Fix handrails that are broken or loose. Make sure that handrails are as long as the stairways. Check any carpeting to make sure that it is firmly attached to the stairs. Fix any carpet that is loose or worn. Avoid having throw rugs at the top or bottom of the stairs. If you do have throw rugs, attach them to the floor with carpet tape. Make sure that you have a light switch at the top of the stairs and the bottom of the stairs. If you do not have  them, ask someone to add them for you. What else can I do to help prevent falls? Wear shoes that: Do not have high heels. Have rubber bottoms. Are comfortable and fit you well. Are closed at the toe. Do not wear sandals. If you use a stepladder: Make sure that it is fully opened. Do not climb a closed stepladder. Make sure that both sides of the stepladder are locked into place. Ask someone to hold it for you, if possible. Clearly mark and make sure that you can see: Any grab bars or handrails. First and last steps. Where the edge of each step is. Use tools that help you move around (mobility aids) if they are needed. These include: Canes. Walkers. Scooters. Crutches. Turn on the lights when you go into a dark area. Replace any light bulbs as soon as they burn out. Set up your furniture so  you have a clear path. Avoid moving your furniture around. If any of your floors are uneven, fix them. If there are any pets around you, be aware of where they are. Review your medicines with your doctor. Some medicines can make you feel dizzy. This can increase your chance of falling. Ask your doctor what other things that you can do to help prevent falls. This information is not intended to replace advice given to you by your health care provider. Make sure you discuss any questions you have with your health care provider. Document Released: 04/28/2009 Document Revised: 12/08/2015 Document Reviewed: 08/06/2014 Elsevier Interactive Patient Education  2017 [employer].

## 2024-05-22 ENCOUNTER — Other Ambulatory Visit: Payer: Self-pay | Admitting: Family Medicine

## 2024-05-22 DIAGNOSIS — Z1231 Encounter for screening mammogram for malignant neoplasm of breast: Secondary | ICD-10-CM

## 2024-06-17 LAB — HEMOGLOBIN A1C: Hemoglobin A1C: 11.2

## 2024-06-18 ENCOUNTER — Ambulatory Visit
Admission: RE | Admit: 2024-06-18 | Discharge: 2024-06-18 | Disposition: A | Source: Ambulatory Visit | Attending: Family Medicine | Admitting: Family Medicine

## 2024-06-18 DIAGNOSIS — Z1231 Encounter for screening mammogram for malignant neoplasm of breast: Secondary | ICD-10-CM | POA: Diagnosis not present

## 2024-06-29 ENCOUNTER — Encounter: Admitting: Family Medicine

## 2024-07-04 ENCOUNTER — Other Ambulatory Visit: Payer: Self-pay | Admitting: Family Medicine

## 2024-07-06 NOTE — Telephone Encounter (Signed)
 Requesting: clonazepam  Contract: 12/25/22 UDS: 12/25/22 Last Visit: 12/26/23 Next Visit: 07/17/24 Last Refill: 12/26/23 (90,5)  Please Advise. Rx pending

## 2024-07-17 ENCOUNTER — Encounter: Admitting: Family Medicine

## 2024-07-21 NOTE — Progress Notes (Signed)
 Kristie [name]                                          MRN: [id number]   07/21/2024   The [facility] Quality Team Specialist reviewed this patient medical record for the purposes of chart review for care gap closure. The following were reviewed: chart review for care gap closure-glycemic status assessment.    [facility] Quality Team

## 2024-07-23 ENCOUNTER — Encounter: Payer: Self-pay | Admitting: Family Medicine

## 2024-07-23 ENCOUNTER — Ambulatory Visit (INDEPENDENT_AMBULATORY_CARE_PROVIDER_SITE_OTHER): Admitting: Family Medicine

## 2024-07-23 VITALS — BP 115/78 | HR 82 | Temp 97.7°F | Ht 67.0 in | Wt 225.8 lb

## 2024-07-23 DIAGNOSIS — E7800 Pure hypercholesterolemia, unspecified: Secondary | ICD-10-CM

## 2024-07-23 DIAGNOSIS — F411 Generalized anxiety disorder: Secondary | ICD-10-CM

## 2024-07-23 DIAGNOSIS — E108 Type 1 diabetes mellitus with unspecified complications: Secondary | ICD-10-CM

## 2024-07-23 DIAGNOSIS — Z0000 Encounter for general adult medical examination without abnormal findings: Secondary | ICD-10-CM | POA: Diagnosis not present

## 2024-07-23 DIAGNOSIS — Z79899 Other long term (current) drug therapy: Secondary | ICD-10-CM

## 2024-07-23 DIAGNOSIS — F331 Major depressive disorder, recurrent, moderate: Secondary | ICD-10-CM | POA: Diagnosis not present

## 2024-07-23 LAB — LIPID PANEL
Cholesterol: 241 mg/dL — ABNORMAL HIGH (ref 28–200)
HDL: 61.8 mg/dL
LDL Cholesterol: 162 mg/dL — ABNORMAL HIGH (ref 10–99)
NonHDL: 179.3
Total CHOL/HDL Ratio: 4
Triglycerides: 88 mg/dL (ref 10.0–149.0)
VLDL: 17.6 mg/dL (ref 0.0–40.0)

## 2024-07-23 LAB — COMPREHENSIVE METABOLIC PANEL WITH GFR
ALT: 27 U/L (ref 3–35)
AST: 21 U/L (ref 5–37)
Albumin: 4.2 g/dL (ref 3.5–5.2)
Alkaline Phosphatase: 141 U/L — ABNORMAL HIGH (ref 39–117)
BUN: 21 mg/dL (ref 6–23)
CO2: 29 meq/L (ref 19–32)
Calcium: 9.1 mg/dL (ref 8.4–10.5)
Chloride: 104 meq/L (ref 96–112)
Creatinine, Ser: 0.8 mg/dL (ref 0.40–1.20)
GFR: 81.1 mL/min
Glucose, Bld: 89 mg/dL (ref 70–99)
Potassium: 4.7 meq/L (ref 3.5–5.1)
Sodium: 140 meq/L (ref 135–145)
Total Bilirubin: 1 mg/dL (ref 0.2–1.2)
Total Protein: 6.7 g/dL (ref 6.0–8.3)

## 2024-07-23 LAB — CBC WITH DIFFERENTIAL/PLATELET
Basophils Absolute: 0 K/uL (ref 0.0–0.1)
Basophils Relative: 0.6 % (ref 0.0–3.0)
Eosinophils Absolute: 0.1 K/uL (ref 0.0–0.7)
Eosinophils Relative: 2 % (ref 0.0–5.0)
HCT: 41.4 % (ref 36.0–46.0)
Hemoglobin: 13.8 g/dL (ref 12.0–15.0)
Lymphocytes Relative: 43.1 % (ref 12.0–46.0)
Lymphs Abs: 2 K/uL (ref 0.7–4.0)
MCHC: 33.3 g/dL (ref 30.0–36.0)
MCV: 88.3 fl (ref 78.0–100.0)
Monocytes Absolute: 0.6 K/uL (ref 0.1–1.0)
Monocytes Relative: 11.9 % (ref 3.0–12.0)
Neutro Abs: 2 K/uL (ref 1.4–7.7)
Neutrophils Relative %: 42.4 % — ABNORMAL LOW (ref 43.0–77.0)
Platelets: 268 K/uL (ref 150.0–400.0)
RBC: 4.68 Mil/uL (ref 3.87–5.11)
RDW: 13.6 % (ref 11.5–15.5)
WBC: 4.7 K/uL (ref 4.0–10.5)

## 2024-07-23 LAB — TSH: TSH: 1.2 u[IU]/mL (ref 0.35–5.50)

## 2024-07-23 MED ORDER — PRAVASTATIN SODIUM 20 MG PO TABS: 20.0000 mg | ORAL_TABLET | Freq: Every day | ORAL | 3 refills | Status: AC

## 2024-07-23 MED ORDER — VENLAFAXINE HCL ER 225 MG PO TB24: ORAL_TABLET | ORAL | 1 refills | Status: DC

## 2024-07-23 NOTE — Patient Instructions (Signed)
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
## 2024-07-24 ENCOUNTER — Ambulatory Visit: Payer: Self-pay | Admitting: Family Medicine

## 2024-07-24 LAB — DRUG MONITOR, PANEL 1, SCREEN, URINE
Amphetamines: NEGATIVE ng/mL
Barbiturates: NEGATIVE ng/mL
Benzodiazepines: NEGATIVE ng/mL
Cocaine Metabolite: NEGATIVE ng/mL
Creatinine: 183.7 mg/dL
Marijuana Metabolite: NEGATIVE ng/mL
Methadone Metabolite: NEGATIVE ng/mL
Opiates: NEGATIVE ng/mL
Oxidant: NEGATIVE ug/mL
Oxycodone: NEGATIVE ng/mL
Phencyclidine: NEGATIVE ng/mL
pH: 5.9 (ref 4.5–9.0)

## 2024-07-24 LAB — DM TEMPLATE

## 2024-07-27 MED ORDER — ATORVASTATIN CALCIUM 20 MG PO TABS: 20.0000 mg | ORAL_TABLET | Freq: Every day | ORAL | 2 refills | Status: AC

## 2024-07-29 ENCOUNTER — Telehealth: Payer: Self-pay

## 2024-07-29 NOTE — Telephone Encounter (Signed)
 Source  Issac, Travia F (Patient)   Subject  Corredor, Wilba Mutz (Patient)   Topic  Clinical - Medication Question    Communication  Reason for CRM: has to write it for 100 mg and 75 mg in order to be 225 mg for the insurance to cover it -- has to be 2 different prescription  Venlafaxine  HCl 225 MG TB24    Spoke with pt, she takes 2 tabs of the 150mg  daily. Please further advise

## 2024-07-31 MED ORDER — VENLAFAXINE HCL 100 MG PO TABS: ORAL_TABLET | ORAL | 3 refills | Status: DC

## 2024-07-31 MED ORDER — VENLAFAXINE HCL ER 150 MG PO CP24: ORAL_CAPSULE | ORAL | 3 refills | Status: AC

## 2024-07-31 NOTE — Telephone Encounter (Signed)
 Effexor  xr 150mg , 2 caps every day rx'd today

## 2024-07-31 NOTE — Addendum Note (Signed)
 Addended by: CANDISE ALEENE DEL on: 07/31/2024 11:12 AM   Modules accepted: Orders

## 2024-07-31 NOTE — Telephone Encounter (Signed)
 Pt advised medication sent to pharmacy for current dosing she is taking, advised her to follow up with the pharmacy regarding cost. Pt voiced understanding.

## 2024-08-06 ENCOUNTER — Ambulatory Visit: Admitting: Sports Medicine

## 2024-08-06 ENCOUNTER — Encounter: Payer: Self-pay | Admitting: Sports Medicine

## 2024-08-06 ENCOUNTER — Ambulatory Visit: Payer: Self-pay

## 2024-08-06 VITALS — BP 116/82 | HR 102 | Temp 98.5°F | Wt 235.0 lb

## 2024-08-06 DIAGNOSIS — J029 Acute pharyngitis, unspecified: Secondary | ICD-10-CM

## 2024-08-06 DIAGNOSIS — H9201 Otalgia, right ear: Secondary | ICD-10-CM | POA: Diagnosis not present

## 2024-08-06 DIAGNOSIS — R051 Acute cough: Secondary | ICD-10-CM | POA: Diagnosis not present

## 2024-08-06 DIAGNOSIS — J0100 Acute maxillary sinusitis, unspecified: Secondary | ICD-10-CM

## 2024-08-06 LAB — POC COVID19/FLU A&B COMBO
Covid Antigen, POC: NEGATIVE
Influenza A Antigen, POC: NEGATIVE
Influenza B Antigen, POC: NEGATIVE

## 2024-08-06 LAB — POCT RAPID STREP A (OFFICE): Rapid Strep A Screen: NEGATIVE

## 2024-08-06 MED ORDER — AMOXICILLIN-POT CLAVULANATE 500-125 MG PO TABS: 1.0000 | ORAL_TABLET | Freq: Two times a day (BID) | ORAL | 0 refills | Status: AC

## 2024-08-06 MED ORDER — BENZONATATE 200 MG PO CAPS: 200.0000 mg | ORAL_CAPSULE | Freq: Two times a day (BID) | ORAL | 0 refills | Status: AC | PRN
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
## 2024-08-06 NOTE — Assessment & Plan Note (Addendum)
 Maxillary sinus tenderness Afebrile  Instructed to take flonase , c Orders:   amoxicillin -clavulanate (AUGMENTIN ) 500-125 MG tablet; Take 1 tablet by mouth in the morning and at bedtime.

## 2024-08-06 NOTE — Telephone Encounter (Signed)
" °  FYI Only or Action Required?: FYI only for provider: appointment scheduled on 1/22.  Patient was last seen in primary care on 07/23/2024 by McGowen, Aleene DEL, MD.  Called Nurse Triage reporting Nasal Congestion and Cough.  Symptoms began several days ago.  Interventions attempted: OTC medications: tylenol , ibuprofen , aleve.  Symptoms are: gradually worsening.  Triage Disposition: See Physician Within 24 Hours  Patient/caregiver understands and will follow disposition?: Yes   Reason for Triage: Pt think she has the flu, symptoms since Saturday. Head feels its about to blow off, chest tightness, running nose, severe earache.    Reason for Disposition  Earache  Answer Assessment - Initial Assessment Questions 1. ONSET: When did the nasal discharge start?      Saturday   3. COUGH: Do you have a cough? If Yes, ask: Describe the color of your mucus. (e.g., clear, white, yellow, green)     Yes, green mucus  4. RESPIRATORY DISTRESS: Describe your breathing.      I am coughing out the ying yang  5. FEVER: Do you have a fever? If Yes, ask: What is your temperature, how was it measured, and when did it start?     Pt reporting probable fever but has not checked, also reports   7. OTHER SYMPTOMS: Do you have any other symptoms? (e.g., earache, mouth sores, sore throat, wheezing) Congestion, earache, drainage, sore throat, cough  Taking Tylenol  and Aleve and ibuprofen   T1D  Protocols used: Common Cold-A-AH  "

## 2024-08-20 ENCOUNTER — Ambulatory Visit: Admitting: Family Medicine
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
## 2024-08-21 ENCOUNTER — Encounter: Payer: Self-pay | Admitting: Family Medicine

## 2024-08-27 ENCOUNTER — Encounter: Admitting: Family Medicine

## 2024-09-22 ENCOUNTER — Other Ambulatory Visit

## 2025-05-19 ENCOUNTER — Ambulatory Visit
